# Patient Record
Sex: Female | Born: 1935 | Race: Black or African American | Hispanic: No | Smoking: Never smoker
Health system: Southern US, Community
[De-identification: ages and names within clinical notes are randomized; demographics above are authoritative.]

## PROBLEM LIST (undated history)

## (undated) DIAGNOSIS — J449 Chronic obstructive pulmonary disease, unspecified: Secondary | ICD-10-CM

## (undated) DIAGNOSIS — I491 Atrial premature depolarization: Secondary | ICD-10-CM

## (undated) DIAGNOSIS — I341 Nonrheumatic mitral (valve) prolapse: Secondary | ICD-10-CM

## (undated) DIAGNOSIS — I495 Sick sinus syndrome: Secondary | ICD-10-CM

## (undated) DIAGNOSIS — N3090 Cystitis, unspecified without hematuria: Secondary | ICD-10-CM

## (undated) DIAGNOSIS — I10 Essential (primary) hypertension: Secondary | ICD-10-CM

## (undated) DIAGNOSIS — E785 Hyperlipidemia, unspecified: Secondary | ICD-10-CM

## (undated) DIAGNOSIS — N189 Chronic kidney disease, unspecified: Secondary | ICD-10-CM

## (undated) DIAGNOSIS — M5090 Cervical disc disorder, unspecified, unspecified cervical region: Secondary | ICD-10-CM

## (undated) DIAGNOSIS — E119 Type 2 diabetes mellitus without complications: Secondary | ICD-10-CM

## (undated) DIAGNOSIS — K219 Gastro-esophageal reflux disease without esophagitis: Secondary | ICD-10-CM

## (undated) DIAGNOSIS — K5730 Diverticulosis of large intestine without perforation or abscess without bleeding: Secondary | ICD-10-CM

## (undated) DIAGNOSIS — M1990 Unspecified osteoarthritis, unspecified site: Secondary | ICD-10-CM

## (undated) DIAGNOSIS — E11319 Type 2 diabetes mellitus with unspecified diabetic retinopathy without macular edema: Secondary | ICD-10-CM

## (undated) DIAGNOSIS — K259 Gastric ulcer, unspecified as acute or chronic, without hemorrhage or perforation: Secondary | ICD-10-CM

## (undated) HISTORY — PX: COLONOSCOPY: SHX174

## (undated) HISTORY — PX: ESOPHAGOGASTRODUODENOSCOPY: SHX1529

## (undated) HISTORY — PX: KIDNEY SURGERY: SHX687

## (undated) HISTORY — PX: OTHER SURGICAL HISTORY: SHX169

## (undated) HISTORY — PX: THYROIDECTOMY, PARTIAL: SHX18

## (undated) HISTORY — PX: ABDOMINAL HYSTERECTOMY: SHX81

## (undated) HISTORY — PX: PARATHYROIDECTOMY: SHX19

## (undated) HISTORY — PX: BACK SURGERY: SHX140

---

## 2005-06-09 ENCOUNTER — Ambulatory Visit: Payer: Self-pay | Admitting: Internal Medicine

## 2005-06-16 ENCOUNTER — Ambulatory Visit: Payer: Self-pay | Admitting: Internal Medicine

## 2006-01-22 ENCOUNTER — Encounter: Payer: Self-pay | Admitting: Internal Medicine

## 2006-01-25 ENCOUNTER — Encounter: Payer: Self-pay | Admitting: Internal Medicine

## 2006-02-12 ENCOUNTER — Ambulatory Visit: Payer: Self-pay | Admitting: Unknown Physician Specialty

## 2006-02-24 ENCOUNTER — Encounter: Payer: Self-pay | Admitting: Internal Medicine

## 2006-05-18 ENCOUNTER — Ambulatory Visit: Payer: Self-pay

## 2006-05-20 ENCOUNTER — Ambulatory Visit: Payer: Self-pay | Admitting: Internal Medicine

## 2006-06-18 ENCOUNTER — Ambulatory Visit: Payer: Self-pay | Admitting: Internal Medicine

## 2007-03-06 ENCOUNTER — Emergency Department: Payer: Self-pay | Admitting: Unknown Physician Specialty

## 2007-03-06 ENCOUNTER — Other Ambulatory Visit: Payer: Self-pay

## 2007-03-12 ENCOUNTER — Ambulatory Visit: Payer: Self-pay | Admitting: Gastroenterology

## 2007-03-16 ENCOUNTER — Ambulatory Visit: Payer: Self-pay | Admitting: Gastroenterology

## 2007-03-18 ENCOUNTER — Ambulatory Visit: Payer: Self-pay | Admitting: Gastroenterology

## 2007-06-07 ENCOUNTER — Inpatient Hospital Stay (HOSPITAL_COMMUNITY): Admission: RE | Admit: 2007-06-07 | Discharge: 2007-06-11 | Payer: Self-pay | Admitting: Neurosurgery

## 2007-06-11 ENCOUNTER — Encounter: Payer: Self-pay | Admitting: Internal Medicine

## 2007-06-28 ENCOUNTER — Encounter: Payer: Self-pay | Admitting: Internal Medicine

## 2007-07-28 ENCOUNTER — Encounter: Payer: Self-pay | Admitting: Internal Medicine

## 2007-08-03 ENCOUNTER — Ambulatory Visit: Payer: Self-pay

## 2007-08-28 ENCOUNTER — Encounter: Payer: Self-pay | Admitting: Internal Medicine

## 2007-09-27 ENCOUNTER — Encounter: Payer: Self-pay | Admitting: Internal Medicine

## 2008-02-02 ENCOUNTER — Ambulatory Visit: Payer: Self-pay | Admitting: Podiatry

## 2008-02-18 ENCOUNTER — Ambulatory Visit: Payer: Self-pay | Admitting: Internal Medicine

## 2008-03-03 ENCOUNTER — Ambulatory Visit: Payer: Self-pay | Admitting: Internal Medicine

## 2009-02-07 ENCOUNTER — Encounter: Admission: RE | Admit: 2009-02-07 | Discharge: 2009-02-07 | Payer: Self-pay | Admitting: Podiatry

## 2009-04-09 ENCOUNTER — Ambulatory Visit: Payer: Self-pay | Admitting: Internal Medicine

## 2009-07-09 ENCOUNTER — Ambulatory Visit: Payer: Self-pay | Admitting: Internal Medicine

## 2009-07-14 ENCOUNTER — Ambulatory Visit: Payer: Self-pay | Admitting: Internal Medicine

## 2009-07-18 ENCOUNTER — Encounter: Payer: Self-pay | Admitting: Internal Medicine

## 2009-07-27 ENCOUNTER — Encounter: Payer: Self-pay | Admitting: Internal Medicine

## 2010-04-15 ENCOUNTER — Ambulatory Visit: Payer: Self-pay | Admitting: Internal Medicine

## 2010-07-04 ENCOUNTER — Ambulatory Visit: Payer: Self-pay | Admitting: Internal Medicine

## 2010-07-18 ENCOUNTER — Ambulatory Visit: Payer: Self-pay | Admitting: Internal Medicine

## 2010-12-17 ENCOUNTER — Ambulatory Visit: Payer: Self-pay | Admitting: Urology

## 2011-01-06 ENCOUNTER — Ambulatory Visit: Payer: Self-pay | Admitting: Internal Medicine

## 2011-03-11 NOTE — Op Note (Signed)
[name], [name] NO.:  [id number]   MEDICAL RECORD NO.:  [id number]          PATIENT TYPE:  INP   LOCATION:  3005                         FACILITY:  [facility]   PHYSICIAN:  Hewitt Shorts, M.D.DATE OF BIRTH:  1931-01-22   DATE OF PROCEDURE:  06/07/2007  DATE OF DISCHARGE:                               OPERATIVE REPORT   PREOPERATIVE DIAGNOSES:  Lumbar stenosis, lumbar degenerative  spondylolisthesis, lumbar spondylosis and lumbar radiculopathy.   POSTOPERATIVE DIAGNOSES:  Lumbar stenosis, lumbar degenerative  spondylolisthesis, lumbar spondylosis and lumbar radiculopathy.   PROCEDURES:  1. L4-5 lumbar decompression (beyond that necessary for interbody      fusion, including laminectomy, facetectomy and foraminotomy with      microdissection).  2. Bilateral L4-5 posterior lumbar interbody fusion with AVS PEEK      interbody implants and Vitoss with bone marrow aspirate.  3. Bilateral L4-5 posterolateral arthrodesis with Radius posterior      instrumentation, Vitoss with bone marrow aspirate, Infuse and      locally harvested morcellized autograft.   SURGEON:  Hewitt Shorts, M.D.   ASSISTANTS:  Hilda Lias, M.D.  Russell L. Webb Silversmith, RN.   ANESTHESIA:  General endotracheal.   INDICATIONS:  The patient is a 77 year old woman, who had a degenerative  spondylolisthesis at L4 and 5 with resulting significant multifactorial  lumbar stenosis, with persistent lumbar radicular pain.  The decision  was made to proceeded with decompression and stabilization.   DESCRIPTION OF PROCEDURES:  The patient was brought to the operating  room, placed under general endotracheal anesthesia.  The patient was  turned to a prone position and the lumbar region was prepped with  Betadine Soap and Solution and draped in a sterile fashion.  The midline  was infiltrated with local anesthetic with epinephrine.  An x-ray was  taken and the L4-5 level identified.  A midline  incision was made over  the L4-5 level and carried down through the subcutaneous tissue.  Bipolar cautery and electrocautery were used to maintain hemostasis.  Dissection was carried down to the lumbar fascia, which was incised  bilaterally, and the paraspinal muscles were dissected off the spinous  processes and laminae in a subperiosteal fashion.  A self-retaining  retractor was placed and another x-ray taken and the L4-5 interlaminar  space identified.  Dissection was then carried out laterally over the  hypertrophic facet joints, and the transverse processes of L4 and L5  were identified and exposed.  With magnification, we then proceeded with  a decompression.  Using the [facility] drill and Kerrison punches with double-  action rongeurs to perform a laminectomy, portions of the bone were  saved and cleaned of soft tissue and morcellized to later be used as  autograft.  The spinous processes and laminae were removed, and we then  proceeded with facetectomy, exposing the neural foramina and removing  the thickened ligamentum flavum and exposing the thecal sac and exiting  nerve roots.  We were able to expose and decompress the L4 and L5 nerve  roots bilaterally.  We then exposed the annulus of the L4-5  disk  bilaterally.  Overlying veins were coagulated and divided, and then we  incised the annulus and entered into the disk space using a variety of  microcurets and pituitary rongeurs to perform a thorough diskectomy.  Osteophyte overgrowth from the posterior aspect of the L4 and L5  vertebrae were removed using Kerrison punches and the osteophyte removal  tool, and in the end, a good decompression was achieved with removal of  the degenerated disk material.  We then used a variety of paddle curets  to prepare the vertebral body endplates, removing the cartilaginous  surface down to a good bony surface.  We measured the height of the  intervertebral disk space and selected 10 mm in height  implants.  We  then went ahead and draped the C-arm fluoroscope.  It was brought into  the field, and we probed the L5 pedicles bilaterally and aspirated bone  marrow aspirate from the L5 vertebral body, which was injected over a 10-  mL strip of Vitoss.  We then went ahead and packed the interbody  implants with the Vitoss with bone marrow aspirate, and then carefully  retracted the thecal sac and nerve root.  We positioned the implants in  the intervertebral disk space and they were countersunk.  We placed the  implant first on the right side, and then packed some Vitoss with bone  marrow aspirate in the midline of the disk space, and then placed a  second implant on the left side.  Once both implants were in place, we  proceeded with the posterolateral arthrodesis.  The L5 pedicles were  examined with a ball probe.  No cutouts were found.  They were each then  tapped with  5.25-mm tap, again, examined with a ball probe and no  cutouts were found, and then we placed 5.75 x 40-mm screws.  Then, the  pedicle entry sites for L4 were identified bilaterally.  Each of the  pedicles was probed, reexamined with a ball probe and no cutouts were  found.  Each of them was tapped with a 5.25-mm tap, and again examined  with the ball probe and no cutouts were found, and then we went ahead  and placed 5.75 x 50-mm screws bilaterally.  An A/P view was obtained  and confirmed good trajectories through the pedicle.  We then went ahead  and decorticated the transverse processes of L4 and L5 bilaterally, and  packed Infuse, Vitoss with bone marrow aspirate, and locally harvested  morcellized autograft in the lateral gutters over the transverse  processes of the intertransverse space.  We then selected 30-mm  prelordosed rods that were placed in the screwheads and secured with  locking caps.  Each of the locking caps was then tightened against the  countertorque.  Once all 4 locking caps were secured, we  then again  reexamined the decompression, and the thecal sac and nerve roots  remained well decompressed, and then we proceeded with closure.  The  wound had been irrigated numerous times throughout the procedure with  both saline and Bacitracin solution.  The paraspinal muscles were  approximated with interrupted undyed 1 Vicryl sutures, the deep fascia  closed with interrupted undyed 1 Vicryl sutures, the Scarpa fascia  closed with interrupted undyed 1 Vicryl sutures, and the subcutaneous  and subcuticular layers were closed with interrupted, inverted 2-0 and 3-  0 undyed Vicryl sutures, and the skin edges were approximated with  surgical staples and the wound was dressed with Adaptic and  sterile  gauze.  The procedure was tolerated well.  The estimated blood loss was  450 cc.  We were able to give the patient back 200 mL of Cell Saver  blood.  Sponge and needle counts were correct.      Hewitt Shorts, M.D.  Electronically Signed     RWN/MEDQ  D:  06/07/2007  T:  06/07/2007  Job:  [id number]

## 2011-03-11 NOTE — Discharge Summary (Signed)
[name], [name] NO.:  [id number]   MEDICAL RECORD NO.:  [id number]          PATIENT TYPE:  INP   LOCATION:  3005                         FACILITY:  [facility]   PHYSICIAN:  Hewitt Shorts, M.D.DATE OF BIRTH:  Nov 09, 1930   DATE OF ADMISSION:  06/07/2007  DATE OF DISCHARGE:                               DISCHARGE SUMMARY   HISTORY OF PRESENT ILLNESS:  The patient is a 77 year old woman who has  had difficulty with low back and left thumb radicular pain.  She has a  dynamic, degenerative, grade 1 spondylolisthesis at L4 and L5 with  marked multifactorial stenosis at L4-5.  She was admitted for  decompression and arthrodesis after having undergone extensive  nonsurgical management for over a year.   PAST MEDICAL HISTORY:  Hypertension.  Diabetes.  Hypercholesterolemia.  No history of myocardial infarction, cancer, stroke, peptic ulcer  disease or lung disease.   PAST SURGICAL HISTORY:  Hysterectomy.  Removal of benign right kidney tumor.  Ulcer surgery.   CURRENT MEDICATIONS:  1. Nexium 40 mg b.i.d.  2. Actos 15 mg daily.  3. Glyburide/Metformin 5/500 daily.  4. Metoprolol 50 mg daily.  5. Caltrate with vitamin-D, 1 tablet b.i.d.   PHYSICAL EXAMINATION:  GENERAL:  A well developed, well nourished woman  in no acute distress.  General examination is unremarkable.  NEUROLOGIC:  Strength 5/5 with good sensation.   HOSPITAL COURSE:  The patient was admitted and underwent L4-5 lumbar  decompression, posterior lumbar interbody fusion with interbody implants  and posterolateral arthrodesis with posterior instrumentation and bone  graft.  She has done well following surgery.  Her wound has healed  nicely.  Her staples have to be removed on Monday, August 18th.  She was  seen in consultation postoperatively by Physical Therapy and  Occupational Therapy; they have been working with her on mobility,  transfers, ambulation, ADL's, etc.  Social Work consultation  was  requested to arrange for a brief course of rehabilitation in a skilled  nursing facility.  The patient requested [facility] in [facility]  and the case management service made the arrangements for this.  She is  scheduled to be discharged on June 11, 2007 to [facility].   DISCHARGE DIAGNOSES:  1. Lumbar dynamic degenerative grade 1 spondylolisthesis at L4-5.  2. Multifactorial spinal stenosis at L4-5.  3. Lumbar spondylosis.  4. Lumbar degenerative disc disease.  5. Lumbar radiculopathy.   DISCHARGE MEDICATIONS:  As prior to admission, as well as pain  medication--either Vicodin or Percocet as needed for discomfort or pain.   FOLLOWUP:  She is to return to my office in three to four weeks for  followup with AP and lateral lumbar spine x-rays.  The staples can be  removed at the nursing facility unless there are any concerns about the  wound itself.      Hewitt Shorts, M.D.  Electronically Signed     RWN/MEDQ  D:  06/10/2007  T:  06/10/2007  Job:  [id number]

## 2011-03-11 NOTE — H&P (Signed)
NAMEBRENDALIZ, [name] NO.:  [id number]   MEDICAL RECORD NO.:  [id number]          PATIENT TYPE:  INP   LOCATION:  3005                         FACILITY:  [facility]   PHYSICIAN:  Hewitt Shorts, M.D.DATE OF BIRTH:  [birth year]   DATE OF ADMISSION:  06/07/2007  DATE OF DISCHARGE:                              HISTORY & PHYSICAL   ADMISSION HISTORY AND PHYSICAL EXAMINATION:  The patient is a 77-year-  old right-handed black female whom I have seen off and on for several  years regarding both degenerative changes of the cervical and lumbar  spine.  We have been treating her for the past year for persistent low  back and left lumbar radicular pain.  X-rays have shown a dynamic  degenerative grade 1 spondylolisthesis L4-5.  MRI scan shows the grade 1  spondylolisthesis at L4-5 with marked multifactorial spinal stenosis at  L4-5 and the patient is admitted now for decompression and arthrodesis.   The patient has been treated with extensive non-surgical management  including NSAIDs, physical therapy, steroid dose packs and a number of  epidural steroid injections.  She would get some transient benefit but  no lasting relief.  The patient continues to have significant disabling  low back and left lumbar radicular pain extending from the low back into  the left buttock and posterior thigh.  She gets relief with sitting or  lying but is in pain with being up and about.  We discussed options for  surgical intervention and she is admitted now for such.   PAST MEDICAL HISTORY:  Notable for history of hypertension and diabetes  for over 10 years as well as a history of hypercholesterolemia.  No  history of myocardial infarction, cancer, stroke, peptic ulcer disease  or lung disease.   PAST SURGICAL HISTORY:  Hysterectomy 30 years ago, removal of a benign  right kidney tumor 15 years ago and ulcer surgery 25 years ago.   ALLERGIES:  NO KNOWN DRUG ALLERGIES.   MEDICATIONS:  At  the time of admission included:  1. Nexium 40 mg b.i.d.  2. Actos 15 mg daily.  3. Glyburide/metformin 5/500 mg daily.  4. Metoprolol 50 mg daily.  5. Caltrate with vitamin D.   FAMILY HISTORY:  Her parents have passed on.  Her mother had a stroke  and died at age 49.  Her father had a brain tumor and died at age 25.   SOCIAL HISTORY:  The patient is widowed.  She is retired.  She does not  smoke, drink alcoholic beverages or have a history of substance abuse.   REVIEW OF SYSTEMS:  Notable for what is described in history of present  illness and past medical history but is otherwise unremarkable.   PHYSICAL EXAMINATION:  GENERAL:  The patient is a well-developed, well-  nourished black female in no acute distress.  VITAL SIGNS:  Her height is 5'3.  Weight 75 kg.  Temperature 98.4,  pulse 50, blood pressure 187/96, respiratory rate 16.  LUNGS:  Clear to auscultation.  She has symmetrical respiratory  excursion.  HEART:  Regular rate and  rhythm.  Normal S1, S2.  There is no murmur.  ABDOMEN:  Soft, nondistended.  Bowel sounds are present.  EXTREMITIES:  Examination shows no clubbing, cyanosis or edema.  MUSCULOSKELETAL:  Mild tenderness to palpation diffusely in the lumbar  region.  She is able to flex to 90 degrees and extend to 10 degrees.  There is mild discomfort on extension.  Straight leg raising is negative  bilaterally.  NEUROLOGIC:  Examination shows 5/5 strength to the lower extremities  including the ileal psoas, quadriceps, dorsiflexion, extensor hallicis  longus and plantar flexor bilaterally.  Sensation is intact to pinprick  to the upper and lower extremities.  Reflexes are absent to the middle  in the biceps, brachialis, triceps, quadriceps and gastrocnemius.  They  are symmetric bilaterally.  Toes are downgoing bilaterally.  She has a  normal gait and stance.   IMPRESSION:  The patient with grade 1 dynamic degenerative  spondylolisthesis L4-5 with associated  marked severe multifactorial  lumbar stenosis at L4-5 with low back and left lumbar radicular pain.   PLAN:  The patient will be admitted for bilateral L4-5 lumbar  decompression with laminectomy, facetectomy, micro diskectomy, posterior  lumbar antibody fusion with antibody implants and bone graft to L4-5  posterolateral arthrodesis with posterior sensation and bone graft.  I  have discussed the typical surgery, hospital stay and overall  cooperation.  Limitations postoperatively, need for postoperative  immobilization in a lumbar corset.  Risks of surgery including risks of  infection, bleeding, possibility of transfusion, the risk of liver  dysfunction with pain, weakness, numbness or paresthesias, the risk of  dural tear and CSF leakage, possible need for further surgery, the risk  of failure of the arthrodesis and the risk for myocardial infarction,  stroke, pneumonia and death.  Understanding all of this, she would like  to go ahead with surgery and is admitted for such.      Hewitt Shorts, M.D.  Electronically Signed     RWN/MEDQ  D:  06/09/2007  T:  06/09/2007  Job:  [id number]

## 2011-04-17 ENCOUNTER — Ambulatory Visit: Payer: Self-pay | Admitting: Internal Medicine

## 2011-08-11 LAB — URINE CULTURE: Colony Count: 9000

## 2011-08-11 LAB — CBC
HCT: 26.8 — ABNORMAL LOW
HCT: 40.1
Hemoglobin: 13.6
Hemoglobin: 9 — ABNORMAL LOW
MCHC: 33.7
MCHC: 33.9
MCV: 82.5
MCV: 82.7
Platelets: 131 — ABNORMAL LOW
Platelets: 184
RBC: 3.24 — ABNORMAL LOW
RBC: 4.86
RDW: 13.6
RDW: 13.9
WBC: 3.7 — ABNORMAL LOW
WBC: 5.3

## 2011-08-11 LAB — BASIC METABOLIC PANEL
BUN: 11
CO2: 29
Calcium: 9.6
Chloride: 102
Creatinine, Ser: 1.11
GFR calc Af Amer: 58 — ABNORMAL LOW
GFR calc non Af Amer: 48 — ABNORMAL LOW
Glucose, Bld: 77
Potassium: 4.6
Sodium: 139

## 2011-08-11 LAB — TYPE AND SCREEN
ABO/RH(D): A POS
Antibody Screen: NEGATIVE

## 2011-08-11 LAB — URINE MICROSCOPIC-ADD ON

## 2011-08-11 LAB — URINALYSIS, ROUTINE W REFLEX MICROSCOPIC
Bilirubin Urine: NEGATIVE
Glucose, UA: NEGATIVE
Hgb urine dipstick: NEGATIVE
Ketones, ur: NEGATIVE
Nitrite: NEGATIVE
Protein, ur: NEGATIVE
Specific Gravity, Urine: 1.022
Urobilinogen, UA: 0.2
pH: 5.5

## 2011-08-11 LAB — DIFFERENTIAL
Basophils Absolute: 0
Basophils Relative: 0
Eosinophils Absolute: 0.1
Eosinophils Relative: 2
Lymphocytes Relative: 14
Lymphs Abs: 0.8
Monocytes Absolute: 0.5
Monocytes Relative: 10
Neutro Abs: 3.9
Neutrophils Relative %: 73

## 2011-08-11 LAB — ABO/RH: ABO/RH(D): A POS

## 2011-12-11 ENCOUNTER — Ambulatory Visit: Payer: Self-pay | Admitting: Internal Medicine

## 2011-12-23 ENCOUNTER — Ambulatory Visit: Payer: Self-pay | Admitting: Internal Medicine

## 2012-01-02 ENCOUNTER — Ambulatory Visit: Payer: Self-pay | Admitting: Unknown Physician Specialty

## 2012-06-01 ENCOUNTER — Ambulatory Visit: Payer: Self-pay | Admitting: Unknown Physician Specialty

## 2012-08-10 ENCOUNTER — Ambulatory Visit: Payer: Self-pay | Admitting: Internal Medicine

## 2012-09-21 ENCOUNTER — Ambulatory Visit: Payer: Self-pay | Admitting: Cardiology

## 2012-09-21 LAB — BASIC METABOLIC PANEL
Creatinine: 1.23 mg/dL (ref 0.60–1.30)
Glucose: 92 mg/dL (ref 65–99)
Osmolality: 280 (ref 275–301)
Sodium: 139 mmol/L (ref 136–145)

## 2012-09-21 LAB — CBC WITH DIFFERENTIAL/PLATELET
Basophil %: 0.9 %
Eosinophil #: 0.1 10*3/uL (ref 0.0–0.7)
HCT: 42.4 % (ref 35.0–47.0)
HGB: 14 g/dL (ref 12.0–16.0)
Lymphocyte #: 1.2 10*3/uL (ref 1.0–3.6)
MCH: 27.3 pg (ref 26.0–34.0)
MCHC: 32.9 g/dL (ref 32.0–36.0)
Monocyte #: 0.4 x10 3/mm (ref 0.2–0.9)
Neutrophil %: 59.9 %
WBC: 4.5 10*3/uL (ref 3.6–11.0)

## 2012-09-21 LAB — PROTIME-INR
INR: 0.9
Prothrombin Time: 12.6 secs (ref 11.5–14.7)

## 2012-09-28 ENCOUNTER — Ambulatory Visit: Payer: Self-pay | Admitting: Cardiology

## 2013-03-25 ENCOUNTER — Ambulatory Visit: Payer: Self-pay | Admitting: Emergency Medicine

## 2013-08-18 ENCOUNTER — Ambulatory Visit: Payer: Self-pay | Admitting: Internal Medicine

## 2014-06-14 DIAGNOSIS — I10 Essential (primary) hypertension: Secondary | ICD-10-CM | POA: Insufficient documentation

## 2014-06-14 DIAGNOSIS — E1159 Type 2 diabetes mellitus with other circulatory complications: Secondary | ICD-10-CM | POA: Insufficient documentation

## 2014-06-14 DIAGNOSIS — N183 Chronic kidney disease, stage 3 unspecified: Secondary | ICD-10-CM | POA: Insufficient documentation

## 2014-06-14 DIAGNOSIS — M48061 Spinal stenosis, lumbar region without neurogenic claudication: Secondary | ICD-10-CM | POA: Insufficient documentation

## 2014-06-14 DIAGNOSIS — I495 Sick sinus syndrome: Secondary | ICD-10-CM | POA: Insufficient documentation

## 2014-07-05 DIAGNOSIS — N3941 Urge incontinence: Secondary | ICD-10-CM | POA: Insufficient documentation

## 2014-07-05 DIAGNOSIS — M5430 Sciatica, unspecified side: Secondary | ICD-10-CM | POA: Insufficient documentation

## 2014-07-05 DIAGNOSIS — R350 Frequency of micturition: Secondary | ICD-10-CM | POA: Insufficient documentation

## 2014-07-05 DIAGNOSIS — N3020 Other chronic cystitis without hematuria: Secondary | ICD-10-CM | POA: Insufficient documentation

## 2014-07-05 DIAGNOSIS — R339 Retention of urine, unspecified: Secondary | ICD-10-CM | POA: Insufficient documentation

## 2014-08-09 ENCOUNTER — Ambulatory Visit: Payer: Self-pay | Admitting: Physician Assistant

## 2014-08-14 DIAGNOSIS — J449 Chronic obstructive pulmonary disease, unspecified: Secondary | ICD-10-CM | POA: Insufficient documentation

## 2014-08-15 DIAGNOSIS — E785 Hyperlipidemia, unspecified: Secondary | ICD-10-CM

## 2014-08-15 DIAGNOSIS — E782 Mixed hyperlipidemia: Secondary | ICD-10-CM | POA: Insufficient documentation

## 2014-08-15 DIAGNOSIS — E1169 Type 2 diabetes mellitus with other specified complication: Secondary | ICD-10-CM | POA: Insufficient documentation

## 2014-08-22 ENCOUNTER — Ambulatory Visit: Payer: Self-pay | Admitting: Internal Medicine

## 2014-10-17 DIAGNOSIS — R0681 Apnea, not elsewhere classified: Secondary | ICD-10-CM | POA: Insufficient documentation

## 2014-12-11 ENCOUNTER — Ambulatory Visit: Payer: Self-pay | Admitting: Internal Medicine

## 2014-12-13 DIAGNOSIS — M5136 Other intervertebral disc degeneration, lumbar region: Secondary | ICD-10-CM | POA: Insufficient documentation

## 2014-12-13 DIAGNOSIS — M5116 Intervertebral disc disorders with radiculopathy, lumbar region: Secondary | ICD-10-CM | POA: Insufficient documentation

## 2015-02-13 NOTE — Op Note (Signed)
PATIENT NAME:  Jennifer ScoreCURRIE, Jessica E MR#:  [id number] DATE OF BIRTH:  [birth year]  DATE OF PROCEDURE:  09/28/2012  PREPROCEDURE DIAGNOSIS: Symptomatic sinus bradycardia.   PROCEDURE: Dual-chamber pacemaker implantation.   POSTPROCEDURE DIAGNOSIS: Atrial sensing with ventricular pacing.   INDICATION: The patient is a 81 year old female who has experienced symptomatic sinus bradycardia consistent with sick sinus syndrome. The patient gets dyspneic with minimal levels of exertion with heart rates in the 50s. The patient also experiences generalized fatigue.   DESCRIPTION OF PROCEDURE: The risks, benefits, and alternatives of pacemaker implantation were explained to the patient and informed written consent was obtained.   She was brought to the Operating Room in a fasting state. The left pectoral region was prepped and draped in the usual sterile manner. Anesthesia was obtained with 1% Xylocaine locally. A 6 cm incision was performed over the left pectoral region. The pacemaker pocket was generated by electrocautery and blunt dissection. Access was obtained to the left subclavian vein by fine needle aspiration. Medtronic ventricular and atrial leads were positioned into the right ventricular apex and right atrial appendage under fluoroscopic guidance. After proper thresholds were obtained, the leads were sutured in place. The pacemaker pocket was irrigated with gentamicin solution. The leads were connected to a dual chamber rate-responsive pacemaker generator and positioned in the pocket. The pocket was closed with 2-0 and 4-0 Vicryl, respectively. Steri-Strips and pressure were dressing were applied.  ____________________________ Marcina MillardAlexander Morayma Godown, MD ap:slb D: 09/28/2012 14:01:39 ET     T: 09/28/2012 14:37:33 ET        OB#: [id number] cc: Marcina MillardAlexander Matthias Bogus, MD, <Dictator> Marcina MillardALEXANDER Holly Pring MD ELECTRONICALLY SIGNED 10/08/2012 14:28

## 2015-03-30 ENCOUNTER — Ambulatory Visit: Payer: Self-pay | Admitting: Urology

## 2015-04-05 DIAGNOSIS — I119 Hypertensive heart disease without heart failure: Secondary | ICD-10-CM | POA: Insufficient documentation

## 2015-04-05 DIAGNOSIS — R600 Localized edema: Secondary | ICD-10-CM | POA: Insufficient documentation

## 2015-04-06 DIAGNOSIS — N183 Chronic kidney disease, stage 3 (moderate): Secondary | ICD-10-CM

## 2015-04-06 DIAGNOSIS — E119 Type 2 diabetes mellitus without complications: Secondary | ICD-10-CM | POA: Insufficient documentation

## 2015-04-06 DIAGNOSIS — E1122 Type 2 diabetes mellitus with diabetic chronic kidney disease: Secondary | ICD-10-CM | POA: Insufficient documentation

## 2015-04-17 ENCOUNTER — Other Ambulatory Visit: Payer: Self-pay | Admitting: Neurosurgery

## 2015-04-17 DIAGNOSIS — M4726 Other spondylosis with radiculopathy, lumbar region: Secondary | ICD-10-CM

## 2015-04-24 ENCOUNTER — Ambulatory Visit
Admission: RE | Admit: 2015-04-24 | Discharge: 2015-04-24 | Disposition: A | Discharge status: Home / Self Care | Payer: Medicare Other | Source: Ambulatory Visit | Attending: Neurosurgery | Admitting: Neurosurgery

## 2015-04-24 ENCOUNTER — Encounter: Payer: Self-pay | Admitting: Urology

## 2015-04-24 ENCOUNTER — Ambulatory Visit: Payer: Self-pay | Admitting: Urology

## 2015-04-24 DIAGNOSIS — M4726 Other spondylosis with radiculopathy, lumbar region: Secondary | ICD-10-CM

## 2015-04-24 MED ORDER — ONDANSETRON HCL 4 MG/2ML IJ SOLN
4.0000 mg | Freq: Once | INTRAMUSCULAR | Status: AC
  Administered 2015-04-24: 4 mg via INTRAMUSCULAR

## 2015-04-24 MED ORDER — IOHEXOL 180 MG/ML  SOLN
15.0000 mL | Freq: Once | INTRAMUSCULAR | Status: AC | PRN
  Administered 2015-04-24: 15 mL via EPIDURAL

## 2015-04-24 MED ORDER — MEPERIDINE HCL 100 MG/ML IJ SOLN
50.0000 mg | Freq: Once | INTRAMUSCULAR | Status: AC
  Administered 2015-04-24: 50 mg via INTRAMUSCULAR

## 2015-04-24 NOTE — Discharge Instructions (Signed)
Myelogram Discharge Instructions  1. Go home and rest quietly for the next 24 hours.  It is important to lie flat for the next 24 hours.  Get up only to go to the restroom.  You may lie in the bed or on a couch on your back, your stomach, your left side or your right side.  You may have one pillow under your head.  You may have pillows between your knees while you are on your side or under your knees while you are on your back.  2. DO NOT drive today.  Recline the seat as far back as it will go, while still wearing your seat belt, on the way home.  3. You may get up to go to the bathroom as needed.  You may sit up for 10 minutes to eat.  You may resume your normal diet and medications unless otherwise indicated.  Drink lots of extra fluids today and tomorrow.  4. The incidence of headache, nausea, or vomiting is about 5% (one in 20 patients).  If you develop a headache, lie flat and drink plenty of fluids until the headache goes away.  Caffeinated beverages may be helpful.  If you develop severe nausea and vomiting or a headache that does not go away with flat bed rest, call [phone].  5. You may resume normal activities after your 24 hours of bed rest is over; however, do not exert yourself strongly or do any heavy lifting tomorrow. If when you get up you have a headache when standing, go back to bed and force fluids for another 24 hours.  6. Call your physician for a follow-up appointment.  The results of your myelogram will be sent directly to your physician by the following day.  7. If you have any questions or if complications develop after you arrive home, please call [phone].  Discharge instructions have been explained to the patient.  The patient, or the person responsible for the patient, fully understands these instructions.       May resume Tramadol on April 25, 2015, after 9:30 am.

## 2015-04-24 NOTE — Progress Notes (Signed)
Patient states she has been off Tramadol for at least the past two days.  Jeanne Lohr, RN 

## 2015-07-01 ENCOUNTER — Ambulatory Visit
Admission: EM | Admit: 2015-07-01 | Discharge: 2015-07-01 | Disposition: A | Discharge status: Home / Self Care | Payer: Medicare Other | Attending: Internal Medicine | Admitting: Internal Medicine

## 2015-07-01 DIAGNOSIS — Z7982 Long term (current) use of aspirin: Secondary | ICD-10-CM | POA: Insufficient documentation

## 2015-07-01 DIAGNOSIS — D649 Anemia, unspecified: Secondary | ICD-10-CM | POA: Diagnosis not present

## 2015-07-01 DIAGNOSIS — K921 Melena: Secondary | ICD-10-CM | POA: Diagnosis not present

## 2015-07-01 DIAGNOSIS — R1110 Vomiting, unspecified: Secondary | ICD-10-CM | POA: Diagnosis present

## 2015-07-01 DIAGNOSIS — Z79899 Other long term (current) drug therapy: Secondary | ICD-10-CM | POA: Diagnosis not present

## 2015-07-01 DIAGNOSIS — R710 Precipitous drop in hematocrit: Secondary | ICD-10-CM | POA: Diagnosis not present

## 2015-07-01 LAB — COMPREHENSIVE METABOLIC PANEL
ALK PHOS: 85 U/L (ref 38–126)
ALT: 30 U/L (ref 14–54)
AST: 21 U/L (ref 15–41)
Albumin: 4 g/dL (ref 3.5–5.0)
Anion gap: 10 (ref 5–15)
BILIRUBIN TOTAL: 0.5 mg/dL (ref 0.3–1.2)
BUN: 63 mg/dL — ABNORMAL HIGH (ref 6–20)
CALCIUM: 9.9 mg/dL (ref 8.9–10.3)
CO2: 22 mmol/L (ref 22–32)
Chloride: 103 mmol/L (ref 101–111)
Creatinine, Ser: 2.01 mg/dL — ABNORMAL HIGH (ref 0.44–1.00)
GFR calc non Af Amer: 22 mL/min — ABNORMAL LOW (ref >60)
GFR, EST AFRICAN AMERICAN: 26 mL/min — AB (ref >60)
Glucose, Bld: 176 mg/dL — ABNORMAL HIGH (ref 65–99)
Potassium: 4.7 mmol/L (ref 3.5–5.1)
Sodium: 135 mmol/L (ref 135–145)
TOTAL PROTEIN: 7.4 g/dL (ref 6.5–8.1)

## 2015-07-01 LAB — CBC WITH DIFFERENTIAL/PLATELET
Basophils Absolute: 0.1 10*3/uL (ref 0–0.1)
Basophils Relative: 1 %
EOS ABS: 0.1 10*3/uL (ref 0–0.7)
Eosinophils Relative: 1 %
HCT: 35 % (ref 35.0–47.0)
Hemoglobin: 11.3 g/dL — ABNORMAL LOW (ref 12.0–16.0)
Lymphocytes Relative: 11 %
Lymphs Abs: 1.1 10*3/uL (ref 1.0–3.6)
MCH: 26 pg (ref 26.0–34.0)
MCHC: 32.4 g/dL (ref 32.0–36.0)
MCV: 80.4 fL (ref 80.0–100.0)
MONOS PCT: 7 %
Monocytes Absolute: 0.7 10*3/uL (ref 0.2–0.9)
NEUTROS PCT: 80 %
Neutro Abs: 8.4 10*3/uL — ABNORMAL HIGH (ref 1.4–6.5)
Platelets: 202 10*3/uL (ref 150–440)
RBC: 4.36 MIL/uL (ref 3.80–5.20)
RDW: 15.8 % — AB (ref 11.5–14.5)
WBC: 10.3 10*3/uL (ref 3.6–11.0)

## 2015-07-01 MED ORDER — FAMOTIDINE 40 MG PO TABS
40.0000 mg | ORAL_TABLET | Freq: Once | ORAL | Status: AC
  Administered 2015-07-01: 40 mg via ORAL

## 2015-07-01 NOTE — Discharge Instructions (Signed)
Labs at urgent care today show a slight decrease in hemoglobin (red blood count), to 11.3 from 12.4 in 03/2015. Pick up prescription for nexium and start taking it as planned.   Dose of pepcid  given by mouth at the urgent care today. Please make appointment to see Dr Hyacinth Meeker in the next couple days. Go to Emergency Room for dizziness, weakness, repeated vomiting. Stop hydrochlorothiazide for the next couple days; continue other blood pressure medicines (losartan/HCTZ, amlodipine besylate, isosorbide mononitrate).

## 2015-07-01 NOTE — ED Provider Notes (Signed)
CSN: [id number]     Arrival date & time 07/01/15  1343 History   First MD Initiated Contact with Patient 07/01/15 1540     Chief Complaint  Patient presents with  . Stool Color Change  . Emesis   HPI  Patient is an 81 year old [name] with reported past medical history of upper GI ulcers. She presents today with a 3 week history of dark/black stools, intermittent nausea, and emesis 2. No hematemesis; no coffee-ground emesis. One episode of dizziness, occurred while she was walking to her car from church today and lasted a few seconds. She denies dizziness upon standing. Her med list contains a couple of prescriptions for NSAIDs, but she denies taking them. She is also on several antihypertensives, but blood pressure is in the 100-120s range systolic today. She denies abdominal pain, and indigestion. She had some injections for back pain last week. No fever. Energy level is okay. She has a prescription for Nexium at the pharmacy, that she needs to go pick up.  Past Surgical History  Procedure Laterality Date  . Abdominal hysterectomy     No family history on file. Social History  Substance Use Topics  . Smoking status: Never Smoker   . Smokeless tobacco: None  . Alcohol Use: No    Review of Systems  All other systems reviewed and are negative.   Allergies  Darvon; Hydrocodone; Janumet; Motrin; and Tramadol  Home Medications   Prior to Admission medications   Medication Sig Start Date End Date Taking? Authorizing Provider  amLODipine (NORVASC) 5 MG tablet  01/30/15  Yes Historical Provider, MD  aspirin EC 81 MG tablet Take by mouth.   Yes Historical Provider, MD  budesonide-formoterol (SYMBICORT) 160-4.5 MCG/ACT inhaler Inhale 2 puffs into the lungs every morning.   Yes Historical Provider, MD  glipiZIDE (GLUCOTROL XL) 5 MG 24 hr tablet  06/19/14  Yes Historical Provider, MD  hydrochlorothiazide (HYDRODIURIL) 25 MG tablet Take by mouth. 03/29/15  Yes Historical Provider, MD  isosorbide  mononitrate (IMDUR) 60 MG 24 hr tablet  12/09/14  Yes Historical Provider, MD  losartan-hydrochlorothiazide (HYZAAR) 100-12.5 MG per tablet  01/30/15  Yes Historical Provider, MD  pioglitazone (ACTOS) 15 MG tablet  04/11/15  Yes Historical Provider, MD  acetaminophen (TYLENOL) 325 MG tablet Take by mouth.    Historical Provider, MD  glipiZIDE (GLUCOTROL XL) 5 MG 24 hr tablet Take by mouth. 02/13/15 05/14/15  Historical Provider, MD  linagliptin (TRADJENTA) 5 MG TABS tablet Take 5 mg once daily 10/18/14   Historical Provider, MD  meloxicam (MOBIC) 7.5 MG tablet  12/09/14   Historical Provider, MD  naproxen sodium (RA NAPROXEN SODIUM) 220 MG tablet Take by mouth.    Historical Provider, MD  olmesartan-hydrochlorothiazide (BENICAR HCT) 40-25 MG per tablet  06/08/14   Historical Provider, MD  solifenacin (VESICARE) 5 MG tablet Take 5 mg by mouth. 01/17/15   Historical Provider, MD  traMADol (ULTRAM) 50 MG tablet Take by mouth. 03/29/15   Historical Provider, MD   Meds Ordered and Administered this Visit   Medications  famotidine (PEPCID) tablet 40 mg (40 mg Oral Given 07/01/15 1600)    BP 110/62 mmHg  Pulse 90  Temp(Src) 97.8 F (36.6 C) (Tympanic)  Resp 16  Ht 5\' 2"  (1.575 m)  Wt 178 lb (80.74 kg)  BMI 32.55 kg/m2  SpO2 100% Orthostatic VS for the past 24 hrs:  BP- Lying Pulse- Lying BP- Sitting Pulse- Sitting BP- Standing at 0 minutes Pulse- Standing at 0  minutes  07/01/15 1517 120/55 mmHg 61 101/58 mmHg 64 104/55 mmHg 62    Physical Exam  Constitutional: She is oriented to person, place, and time. No distress.  Alert, nicely groomed  HENT:  Head: Atraumatic.  Eyes:  Conjugate gaze, no eye redness/drainage  Neck: Neck supple.  Cardiovascular: Normal rate and regular rhythm.   Pulmonary/Chest: No respiratory distress. She has no wheezes. She has no rales.  Lungs clear, symmetric breath sounds  Abdominal: Soft. She exhibits no distension. There is no tenderness. There is no rebound and no  guarding.  Musculoskeletal: She exhibits no edema.  No leg swelling  Neurological: She is alert and oriented to person, place, and time.  Skin: Skin is warm and dry.  No cyanosis  Nursing note and vitals reviewed.   ED Course  Procedures  Results for orders placed or performed during the hospital encounter of 07/01/15  Comprehensive metabolic panel  Result Value Ref Range   Sodium 135 135 - 145 mmol/L   Potassium 4.7 3.5 - 5.1 mmol/L   Chloride 103 101 - 111 mmol/L   CO2 22 22 - 32 mmol/L   Glucose, Bld 176 (H) Jennifer - 99 mg/dL   BUN 63 (H) 6 - 20 mg/dL   Creatinine, Ser 1.61 (H) 0.44 - 1.00 mg/dL   Calcium 9.9 8.9 - 09.6 mg/dL   Total Protein 7.4 6.5 - 8.1 g/dL   Albumin 4.0 3.5 - 5.0 g/dL   AST 21 15 - 41 U/L   ALT 30 14 - 54 U/L   Alkaline Phosphatase 85 38 - 126 U/L   Total Bilirubin 0.5 0.3 - 1.2 mg/dL   GFR calc non Af Amer 22 (L) >60 mL/min   GFR calc Af Amer 26 (L) >60 mL/min   Anion gap 10 5 - 15  CBC with Differential  Result Value Ref Range   WBC 10.3 3.6 - 11.0 K/uL   RBC 4.36 3.80 - 5.20 MIL/uL   Hemoglobin 11.3 (L) 12.0 - 16.0 g/dL   HCT 04.5 40.9 - 81.1 %   MCV 80.4 80.0 - 100.0 fL   MCH 26.0 26.0 - 34.0 pg   MCHC 32.4 32.0 - 36.0 g/dL   RDW 91.4 (H) 78.2 - 95.6 %   Platelets 202 150 - 440 K/uL   Neutrophils Relative % 80 %   Neutro Abs 8.4 (H) 1.4 - 6.5 K/uL   Lymphocytes Relative 11 %   Lymphs Abs 1.1 1.0 - 3.6 K/uL   Monocytes Relative 7 %   Monocytes Absolute 0.7 0.2 - 0.9 K/uL   Eosinophils Relative 1 %   Eosinophils Absolute 0.1 0 - 0.7 K/uL   Basophils Relative 1 %   Basophils Absolute 0.1 0 - 0.1 K/uL     Orthostatic VS for the past 24 hrs:  BP- Lying Pulse- Lying BP- Sitting Pulse- Sitting BP- Standing at 0 minutes Pulse- Standing at 0 minutes  07/01/15 1517 120/55 mmHg 61 101/58 mmHg 64 104/55 mmHg 62        MDM   1. Decreased hemoglobin   2. Melena    Hemoglobin is 11.3 today, was 12.4 in June 2016. Does not appear to have  acute blood loss, but does have history suggestive of slow GI bleeding. A dose of Pepcid 40 mg by mouth was given at the urgent care today. Patient agrees to pick up her prescription for Nexium and start taking it. She will follow-up with her PCP/Dr. Bethann Punches, next week. I  encouraged her to not take her hydrochlorothiazide for the next couple of days, because of the lowish blood pressures recorded at the urgent care, and potential risk of fall.    Eustace Moore, MD 07/01/15 [phone]

## 2015-07-01 NOTE — ED Notes (Signed)
Patient complains of dark stools that started 3 weeks ago. Patient states that she has also been nausea and that has been cured by tums, most of the time. She states that twice in the last 2 weeks she has thrown up. She states that stools have been loose as well.

## 2015-07-26 ENCOUNTER — Other Ambulatory Visit: Payer: Self-pay | Admitting: Internal Medicine

## 2015-07-26 DIAGNOSIS — Z1231 Encounter for screening mammogram for malignant neoplasm of breast: Secondary | ICD-10-CM

## 2015-08-28 ENCOUNTER — Ambulatory Visit
Admission: RE | Admit: 2015-08-28 | Discharge: 2015-08-28 | Disposition: A | Discharge status: Home / Self Care | Payer: Medicare Other | Source: Ambulatory Visit | Attending: Internal Medicine | Admitting: Internal Medicine

## 2015-08-28 DIAGNOSIS — Z1231 Encounter for screening mammogram for malignant neoplasm of breast: Secondary | ICD-10-CM | POA: Diagnosis present

## 2015-09-12 ENCOUNTER — Encounter: Payer: Self-pay | Admitting: *Deleted

## 2015-10-09 DIAGNOSIS — I872 Venous insufficiency (chronic) (peripheral): Secondary | ICD-10-CM | POA: Insufficient documentation

## 2015-10-09 DIAGNOSIS — I110 Hypertensive heart disease with heart failure: Secondary | ICD-10-CM | POA: Insufficient documentation

## 2015-10-17 ENCOUNTER — Encounter: Admission: RE | Payer: Self-pay | Source: Ambulatory Visit

## 2015-10-17 ENCOUNTER — Ambulatory Visit
Admission: RE | Admit: 2015-10-17 | Payer: Medicare Other | Source: Ambulatory Visit | Admitting: Unknown Physician Specialty

## 2015-10-17 HISTORY — DX: Nonrheumatic mitral (valve) prolapse: I34.1

## 2015-10-17 HISTORY — DX: Hyperlipidemia, unspecified: E78.5

## 2015-10-17 HISTORY — DX: Unspecified osteoarthritis, unspecified site: M19.90

## 2015-10-17 HISTORY — DX: Atrial premature depolarization: I49.1

## 2015-10-17 HISTORY — DX: Cystitis, unspecified without hematuria: N30.90

## 2015-10-17 HISTORY — DX: Type 2 diabetes mellitus with unspecified diabetic retinopathy without macular edema: E11.319

## 2015-10-17 HISTORY — DX: Diverticulosis of large intestine without perforation or abscess without bleeding: K57.30

## 2015-10-17 HISTORY — DX: Cervical disc disorder, unspecified, unspecified cervical region: M50.90

## 2015-10-17 HISTORY — DX: Chronic obstructive pulmonary disease, unspecified: J44.9

## 2015-10-17 HISTORY — DX: Gastro-esophageal reflux disease without esophagitis: K21.9

## 2015-10-17 HISTORY — DX: Gastric ulcer, unspecified as acute or chronic, without hemorrhage or perforation: K25.9

## 2015-10-17 HISTORY — DX: Essential (primary) hypertension: I10

## 2015-10-17 HISTORY — DX: Sick sinus syndrome: I49.5

## 2015-10-17 HISTORY — DX: Type 2 diabetes mellitus without complications: E11.9

## 2015-10-17 HISTORY — DX: Chronic kidney disease, unspecified: N18.9

## 2015-10-17 SURGERY — ESOPHAGOGASTRODUODENOSCOPY (EGD) WITH PROPOFOL
Anesthesia: General

## 2015-11-27 DIAGNOSIS — I6523 Occlusion and stenosis of bilateral carotid arteries: Secondary | ICD-10-CM | POA: Insufficient documentation

## 2015-12-25 DIAGNOSIS — I340 Nonrheumatic mitral (valve) insufficiency: Secondary | ICD-10-CM | POA: Insufficient documentation

## 2016-03-05 ENCOUNTER — Emergency Department: Payer: Medicare Other

## 2016-03-05 ENCOUNTER — Emergency Department
Admission: EM | Admit: 2016-03-05 | Discharge: 2016-03-05 | Disposition: A | Discharge status: Home / Self Care | Payer: Medicare Other | Attending: Emergency Medicine | Admitting: Emergency Medicine

## 2016-03-05 DIAGNOSIS — E11319 Type 2 diabetes mellitus with unspecified diabetic retinopathy without macular edema: Secondary | ICD-10-CM | POA: Diagnosis not present

## 2016-03-05 DIAGNOSIS — R55 Syncope and collapse: Secondary | ICD-10-CM | POA: Insufficient documentation

## 2016-03-05 DIAGNOSIS — I472 Ventricular tachycardia: Secondary | ICD-10-CM | POA: Diagnosis not present

## 2016-03-05 DIAGNOSIS — J449 Chronic obstructive pulmonary disease, unspecified: Secondary | ICD-10-CM | POA: Diagnosis not present

## 2016-03-05 DIAGNOSIS — I4729 Other ventricular tachycardia: Secondary | ICD-10-CM

## 2016-03-05 DIAGNOSIS — Z79899 Other long term (current) drug therapy: Secondary | ICD-10-CM | POA: Insufficient documentation

## 2016-03-05 DIAGNOSIS — E785 Hyperlipidemia, unspecified: Secondary | ICD-10-CM | POA: Insufficient documentation

## 2016-03-05 DIAGNOSIS — I129 Hypertensive chronic kidney disease with stage 1 through stage 4 chronic kidney disease, or unspecified chronic kidney disease: Secondary | ICD-10-CM | POA: Insufficient documentation

## 2016-03-05 DIAGNOSIS — N189 Chronic kidney disease, unspecified: Secondary | ICD-10-CM | POA: Diagnosis not present

## 2016-03-05 LAB — BASIC METABOLIC PANEL
Anion gap: 10 (ref 5–15)
BUN: 27 mg/dL — ABNORMAL HIGH (ref 6–20)
CALCIUM: 9.7 mg/dL (ref 8.9–10.3)
CHLORIDE: 110 mmol/L (ref 101–111)
CO2: 23 mmol/L (ref 22–32)
Creatinine, Ser: 1.72 mg/dL — ABNORMAL HIGH (ref 0.44–1.00)
GFR, EST AFRICAN AMERICAN: 31 mL/min — AB (ref >60)
GFR, EST NON AFRICAN AMERICAN: 26 mL/min — AB (ref >60)
GLUCOSE: 154 mg/dL — AB (ref 65–99)
POTASSIUM: 3.8 mmol/L (ref 3.5–5.1)
Sodium: 143 mmol/L (ref 135–145)

## 2016-03-05 LAB — URINALYSIS COMPLETE WITH MICROSCOPIC (ARMC ONLY)
BILIRUBIN URINE: NEGATIVE
Glucose, UA: NEGATIVE mg/dL
Hgb urine dipstick: NEGATIVE
Ketones, ur: NEGATIVE mg/dL
NITRITE: NEGATIVE
PH: 5 (ref 5.0–8.0)
Protein, ur: NEGATIVE mg/dL
SPECIFIC GRAVITY, URINE: 1.018 (ref 1.005–1.030)

## 2016-03-05 LAB — PHOSPHORUS: PHOSPHORUS: 3.9 mg/dL (ref 2.5–4.6)

## 2016-03-05 LAB — CBC
HCT: 38.7 % (ref 35.0–47.0)
HEMOGLOBIN: 12.6 g/dL (ref 12.0–16.0)
MCH: 27.4 pg (ref 26.0–34.0)
MCHC: 32.5 g/dL (ref 32.0–36.0)
MCV: 84.2 fL (ref 80.0–100.0)
Platelets: 130 10*3/uL — ABNORMAL LOW (ref 150–440)
RBC: 4.59 MIL/uL (ref 3.80–5.20)
RDW: 16.2 % — ABNORMAL HIGH (ref 11.5–14.5)
WBC: 3.6 10*3/uL (ref 3.6–11.0)

## 2016-03-05 LAB — TROPONIN I: Troponin I: <0.03 ng/mL (ref <0.031)

## 2016-03-05 LAB — MAGNESIUM: Magnesium: 2 mg/dL (ref 1.7–2.4)

## 2016-03-05 NOTE — ED Provider Notes (Addendum)
[facility] Emergency Department Provider Note  ____________________________________________  Time seen: 4:30 PM on arrival by EMS  I have reviewed the triage vital signs and the nursing notes.   HISTORY  Chief Complaint Loss of Consciousness    HPI [name] is a 82 y.o. female brought to the ED for syncope today. The patient denies any chest pain shortness of breath headache numbness tingling weakness, feels like she's been eating and drinking normally in her usual state of health, but today she had a sudden syncopal episode. Did not hit her head. She was seated at this time. She saw cardiology and had her pacemaker interrogated yesterday and there are no acute changes, no reprogramming to the pacemaker. Pacemaker is in place for SA node dysfunction. Patient denies any palpitations     Past Medical History  Diagnosis Date  . Cervical disc disease   . Chronic kidney disease     stage 3  . Cystitis   . Diverticulosis of colon     without mention of hemorrhage  . Diabetes mellitus without complication (HCC)     DM type 2  . Diabetic retinopathy associated with type 2 diabetes mellitus, without macular edema   . Degenerative joint disease   . Gastric ulcer   . GERD (gastroesophageal reflux disease)   . Hyperlipemia   . Hypertension   . COPD (chronic obstructive pulmonary disease) (HCC)   . Mitral valve prolapse   . Premature atrial contraction   . Sick sinus syndrome (HCC)   . Sinoatrial node dysfunction [facility])      Patient Active Problem List   Diagnosis Date Noted  . Diabetes (HCC) 04/06/2015  . Edema leg 04/05/2015  . Hypertensive left ventricular hypertrophy 04/05/2015  . DDD (degenerative disc disease), lumbar 12/13/2014  . Neuritis or radiculitis due to rupture of lumbar intervertebral disc 12/13/2014  . Breathlessness on exertion 10/17/2014  . Combined fat and carbohydrate induced hyperlipemia 08/15/2014  . COPD, mild (HCC)  08/14/2014  . Bladder infection, chronic 07/05/2014  . Incomplete bladder emptying 07/05/2014  . Neuralgia neuritis, sciatic nerve 07/05/2014  . Urge incontinence 07/05/2014  . FOM (frequency of micturition) 07/05/2014  . Benign essential HTN 06/14/2014  . Lumbar canal stenosis 06/14/2014  . Arrhythmia, sinus node (HCC) 06/14/2014     Past Surgical History  Procedure Laterality Date  . Abdominal hysterectomy    . Pacemaker      insertion dual chamber pacemaker generator  . Thyroidectomy, partial      lobectomy partial thyroid  . Parathyroidectomy    . Gastric ulcer surgery    . Back surgery      lumbar back surgery  . Colonoscopy    . Esophagogastroduodenoscopy    . Kidney surgery       Current Outpatient Rx  Name  Route  Sig  Dispense  Refill  . acetaminophen (TYLENOL) 500 MG tablet   Oral   Take 500 mg by mouth every 6 (six) hours as needed for mild pain, moderate pain or fever.         Marland Kitchen amLODipine (NORVASC) 5 MG tablet   Oral   Take 5 mg by mouth daily.          Marland Kitchen aspirin EC 81 MG tablet   Oral   Take 162 mg by mouth daily.          Marland Kitchen atorvastatin (LIPITOR) 10 MG tablet   Oral   Take 10 mg by mouth every morning.         Marland Kitchen  budesonide-formoterol (SYMBICORT) 160-4.5 MCG/ACT inhaler   Inhalation   Inhale 2 puffs into the lungs every morning.         . Capsaicin-Menthol 0.025-1.25 % PTCH   Topical   Apply 1 patch topically daily as needed (for pain).          . carvedilol (COREG) 3.125 MG tablet   Oral   Take 6.25 mg by mouth 2 (two) times daily with a meal.         . glipiZIDE (GLUCOTROL XL) 5 MG 24 hr tablet   Oral   Take 10 mg by mouth 2 (two) times daily.          . Iron-Vitamin C (VITRON-C) 65-125 MG TABS   Oral   Take 1 tablet by mouth daily.         . isosorbide mononitrate (IMDUR) 60 MG 24 hr tablet   Oral   Take 60 mg by mouth daily.          Marland Kitchen losartan-hydrochlorothiazide (HYZAAR) 100-12.5 MG per tablet   Oral    Take 1 tablet by mouth daily.          Marland Kitchen omeprazole (PRILOSEC) 20 MG capsule   Oral   Take 20 mg by mouth 2 (two) times daily.         . pioglitazone (ACTOS) 15 MG tablet   Oral   Take 15 mg by mouth daily.          Marland Kitchen EXPIRED: glipiZIDE (GLUCOTROL XL) 5 MG 24 hr tablet   Oral   Take by mouth.            Allergies Darvon; Hydrocodone; Janumet; Motrin; and Tramadol   Family History  Problem Relation Age of Onset  . Breast cancer Mother   . Breast cancer Maternal Aunt     2 mat aunts  . Breast cancer Other     Social History Social History  Substance Use Topics  . Smoking status: Never Smoker   . Smokeless tobacco: None  . Alcohol Use: No    Review of Systems  Constitutional:   No fever or chills.  Eyes:   No vision changes.  ENT:   No sore throat. No rhinorrhea. Cardiovascular:   No chest pain. Respiratory:   No dyspnea or cough. Gastrointestinal:   Negative for abdominal pain, vomiting and diarrhea.  Genitourinary:   Negative for dysuria or difficulty urinating. Musculoskeletal:   Negative for focal pain or swelling Neurological:   Negative for headaches. Positive syncope 10-point ROS otherwise negative.  ____________________________________________   PHYSICAL EXAM:  VITAL SIGNS: ED Triage Vitals  Enc Vitals Group     BP 03/05/16 1633 152/87 mmHg     Pulse Rate 03/05/16 1633 75     Resp 03/05/16 1633 12     Temp 03/05/16 1633 98.4 F (36.9 C)     Temp Source 03/05/16 1633 Oral     SpO2 03/05/16 1630 97 %     Weight --      Height --      Head Cir --      Peak Flow --      Pain Score 03/05/16 1634 0     Pain Loc --      Pain Edu? --      Excl. in GC? --     Vital signs reviewed, nursing assessments reviewed.   Constitutional:   Alert and oriented. Well appearing and in no distress. Eyes:   No scleral  icterus. No conjunctival pallor. PERRL. EOMI.  No nystagmus. ENT   Head:   Normocephalic and atraumatic.   Nose:   No  congestion/rhinnorhea. No septal hematoma   Mouth/Throat:   MMM, no pharyngeal erythema. No peritonsillar mass.    Neck:   No stridor. No SubQ emphysema. No meningismus. Hematological/Lymphatic/Immunilogical:   No cervical lymphadenopathy. Cardiovascular:   RRR. Symmetric bilateral radial and DP pulses.  No murmurs.  Respiratory:   Normal respiratory effort without tachypnea nor retractions. Breath sounds are clear and equal bilaterally. No wheezes/rales/rhonchi. Gastrointestinal:   Soft and nontender. Non distended. There is no CVA tenderness.  No rebound, rigidity, or guarding. Genitourinary:   deferred Musculoskeletal:   Nontender with normal range of motion in all extremities. No joint effusions.  No lower extremity tenderness.  No edema. Neurologic:   Normal speech and language.  CN 2-10 normal. Motor grossly intact. No gross focal neurologic deficits are appreciated.  Skin:    Skin is warm, dry and intact. No rash noted.  No petechiae, purpura, or bullae.  ____________________________________________    LABS (pertinent positives/negatives) (all labs ordered are listed, but only abnormal results are displayed) Labs Reviewed  BASIC METABOLIC PANEL - Abnormal; Notable for the following:    Glucose, Bld 154 (*)    BUN 27 (*)    Creatinine, Ser 1.72 (*)    GFR calc non Af Amer 26 (*)    GFR calc Af Amer 31 (*)    All other components within normal limits  CBC - Abnormal; Notable for the following:    RDW 16.2 (*)    Platelets 130 (*)    All other components within normal limits  TROPONIN I  MAGNESIUM  PHOSPHORUS  URINALYSIS COMPLETEWITH MICROSCOPIC ([facility] ONLY)  CBG MONITORING, ED   ____________________________________________   EKG  EKG performed at 1640 Interpreted by me Nitropaste rhythm rate of 63, left axis, right bundle-branch block. Normal ST segments and T waves. PR interval of 354 ms  Multiple prehospital EMS EKGs reviewed And 1613, patient appeared to  be in baseline rhythm with left axis, right bundle-branch block, heart rate of 63 At 1617, patient is in a wide complex tachycardia which is regular, no identifiable pacer spikes, left axis, left bundle branch block, heart rate about 100 And 1623, patient remains in a wide complex tachycardia with left bundle branch block left axis heart rate 114, At 1619 EKG is unchanged, tachycardic at 117.  ____________________________________________    RADIOLOGY  Chest x-ray unremarkable  ____________________________________________   PROCEDURES   ____________________________________________   INITIAL IMPRESSION / ASSESSMENT AND PLAN / ED COURSE  Pertinent labs & imaging results that were available during my care of the patient were reviewed by me and considered in my medical decision making (see chart for details).  Patient not in distress. Presents with syncope. EMS EKGs reveal an approximate 10 minute episode of what appears to be a regular wide complex tachycardia, suspicious for ventricular tachycardia. Pacemaker interrogated, devices status is discussed with the [employer] rep who notes that it does not record anything less than 180 bpm and so therefore did not record this episode. Labs unremarkable, chest x-ray unremarkable, vitals remained unremarkable, mental status appropriate. Low suspicion for stroke ACS dissection. Case discussed with Dr. Lady Gary, agrees with telemetry observation overnight and evaluation tomorrow. I'll discuss with the hospitalist for further management.  Patient reports compliance with her medications, not in acute dysrhythmia at this time in the emergency department and not requiring immediate pharmacologic intervention  or cardioversion.  ----------------------------------------- 8:04 PM on 03/05/2016 -----------------------------------------  On evaluation from the hospitalist, the patient refuses to be admitted to the hospital.  She was informed that we are  worried about a serious dysrythmia and is at risk of serious illness that could cause permanent disability or death. She wants to go home and will follow up with cardiology. Informed that she is on is welcome to return to the emergency department or hospital she wishes to continue her care has any other concerns. She has Medical decision-making capacity, not suicidal or hallucinating or otherwise having any disorder of thought Process or content. She is alert and oriented and calm.  Will discharge against medical advice.     ____________________________________________   FINAL CLINICAL IMPRESSION(S) / ED DIAGNOSES  Final diagnoses:  Syncope, unspecified syncope type  Ventricular tachycardia (paroxysmal) (HCC)       Portions of this note were generated with dragon dictation software. Dictation errors may occur despite best attempts at proofreading.   Sharman CheekPhillip Siriyah Ambrosius, MD 03/05/16 1907  Sharman CheekPhillip Danille Oppedisano, MD 03/05/16 2009

## 2016-03-05 NOTE — ED Notes (Addendum)
Pt informed of delay. A/Ox4.

## 2016-03-05 NOTE — ED Notes (Signed)
Pt bib EMS w/ c/o syncopal episode while at work. A/Ox4.  Per EMS, pt "was completely out" and had 1 episode of emesis.  Pt sts "I feel normal", denies pain.  Sts that she has been eating/drinking like normal, no recent illness.  Sts that she saw cardiologist yesterday, pacemaker checked and had meds changed.  NAD

## 2016-03-05 NOTE — Discharge Instructions (Signed)

## 2016-05-20 DIAGNOSIS — E538 Deficiency of other specified B group vitamins: Secondary | ICD-10-CM | POA: Insufficient documentation

## 2016-07-03 ENCOUNTER — Other Ambulatory Visit: Payer: Self-pay | Admitting: Internal Medicine

## 2016-07-03 DIAGNOSIS — Z1231 Encounter for screening mammogram for malignant neoplasm of breast: Secondary | ICD-10-CM

## 2016-09-02 ENCOUNTER — Ambulatory Visit: Admission: RE | Admit: 2016-09-02 | Payer: Medicare Other | Source: Ambulatory Visit

## 2016-09-10 ENCOUNTER — Encounter (INDEPENDENT_AMBULATORY_CARE_PROVIDER_SITE_OTHER): Payer: Self-pay

## 2016-09-10 ENCOUNTER — Ambulatory Visit
Admission: RE | Admit: 2016-09-10 | Discharge: 2016-09-10 | Disposition: A | Discharge status: Home / Self Care | Payer: Medicare Other | Source: Ambulatory Visit | Attending: Internal Medicine | Admitting: Internal Medicine

## 2016-09-10 DIAGNOSIS — Z1231 Encounter for screening mammogram for malignant neoplasm of breast: Secondary | ICD-10-CM | POA: Insufficient documentation

## 2016-12-31 DIAGNOSIS — N183 Chronic kidney disease, stage 3 (moderate): Secondary | ICD-10-CM

## 2016-12-31 DIAGNOSIS — N1831 Chronic kidney disease, stage 3a: Secondary | ICD-10-CM | POA: Insufficient documentation

## 2016-12-31 DIAGNOSIS — I071 Rheumatic tricuspid insufficiency: Secondary | ICD-10-CM | POA: Insufficient documentation

## 2017-01-12 ENCOUNTER — Ambulatory Visit
Admission: RE | Admit: 2017-01-12 | Discharge: 2017-01-12 | Disposition: A | Discharge status: Home / Self Care | Payer: Medicare Other | Source: Ambulatory Visit | Attending: Nurse Practitioner | Admitting: Nurse Practitioner

## 2017-01-12 ENCOUNTER — Other Ambulatory Visit: Payer: Self-pay | Admitting: Nurse Practitioner

## 2017-01-12 DIAGNOSIS — M79605 Pain in left leg: Secondary | ICD-10-CM

## 2017-03-10 DIAGNOSIS — E669 Obesity, unspecified: Secondary | ICD-10-CM | POA: Insufficient documentation

## 2017-03-10 DIAGNOSIS — M7062 Trochanteric bursitis, left hip: Secondary | ICD-10-CM | POA: Insufficient documentation

## 2017-04-13 ENCOUNTER — Other Ambulatory Visit: Payer: Self-pay | Admitting: Neurosurgery

## 2017-04-13 DIAGNOSIS — M5136 Other intervertebral disc degeneration, lumbar region: Secondary | ICD-10-CM

## 2017-04-21 ENCOUNTER — Ambulatory Visit
Admission: RE | Admit: 2017-04-21 | Discharge: 2017-04-21 | Disposition: A | Discharge status: Home / Self Care | Payer: Medicare Other | Source: Ambulatory Visit | Attending: Neurosurgery | Admitting: Neurosurgery

## 2017-04-21 DIAGNOSIS — M5136 Other intervertebral disc degeneration, lumbar region: Secondary | ICD-10-CM

## 2017-04-21 MED ORDER — MEPERIDINE HCL 100 MG/ML IJ SOLN
50.0000 mg | Freq: Once | INTRAMUSCULAR | Status: AC
  Administered 2017-04-21: 50 mg via INTRAMUSCULAR

## 2017-04-21 MED ORDER — ONDANSETRON HCL 4 MG/2ML IJ SOLN
4.0000 mg | Freq: Once | INTRAMUSCULAR | Status: AC
  Administered 2017-04-21: 4 mg via INTRAMUSCULAR

## 2017-04-21 MED ORDER — DIAZEPAM 5 MG PO TABS
5.0000 mg | ORAL_TABLET | Freq: Once | ORAL | Status: AC
  Administered 2017-04-21: 5 mg via ORAL

## 2017-04-21 MED ORDER — IOPAMIDOL (ISOVUE-M 200) INJECTION 41%
15.0000 mL | Freq: Once | INTRAMUSCULAR | Status: AC
  Administered 2017-04-21: 15 mL via INTRATHECAL

## 2017-04-21 NOTE — Discharge Instructions (Signed)

## 2017-05-22 ENCOUNTER — Other Ambulatory Visit: Payer: Self-pay | Admitting: Internal Medicine

## 2017-05-22 DIAGNOSIS — Z1231 Encounter for screening mammogram for malignant neoplasm of breast: Secondary | ICD-10-CM

## 2017-06-09 ENCOUNTER — Ambulatory Visit (INDEPENDENT_AMBULATORY_CARE_PROVIDER_SITE_OTHER): Payer: Medicare Other | Admitting: Vascular Surgery

## 2017-06-09 ENCOUNTER — Encounter (INDEPENDENT_AMBULATORY_CARE_PROVIDER_SITE_OTHER): Payer: Self-pay | Admitting: Vascular Surgery

## 2017-06-09 VITALS — BP 119/69 | HR 90 | Resp 16 | Ht 64.0 in | Wt 178.4 lb

## 2017-06-09 DIAGNOSIS — N183 Chronic kidney disease, stage 3 unspecified: Secondary | ICD-10-CM

## 2017-06-09 DIAGNOSIS — R600 Localized edema: Secondary | ICD-10-CM | POA: Diagnosis not present

## 2017-06-09 DIAGNOSIS — I340 Nonrheumatic mitral (valve) insufficiency: Secondary | ICD-10-CM

## 2017-06-09 DIAGNOSIS — E118 Type 2 diabetes mellitus with unspecified complications: Secondary | ICD-10-CM | POA: Diagnosis not present

## 2017-06-09 DIAGNOSIS — I10 Essential (primary) hypertension: Secondary | ICD-10-CM | POA: Diagnosis not present

## 2017-06-09 NOTE — Assessment & Plan Note (Signed)
blood glucose control important in reducing the progression of atherosclerotic disease. Also, involved in wound healing. On appropriate medications.  

## 2017-06-09 NOTE — Assessment & Plan Note (Signed)
blood pressure control important in reducing the progression of atherosclerotic disease. On appropriate oral medications.  

## 2017-06-09 NOTE — Assessment & Plan Note (Signed)
Could certainly worsen LE swelling 

## 2017-06-09 NOTE — Patient Instructions (Signed)

## 2017-06-09 NOTE — Assessment & Plan Note (Signed)

## 2017-06-09 NOTE — Progress Notes (Signed)
Patient ID: [name], female   DOB: [birth year], 83 y.o.   MRN: [id number]  Chief Complaint  Patient presents with  . New Patient (Initial Visit)    Pt stated B/l feet and ankle are swelling.    HPI [name] is a 83 y.o. female.  I am asked to see the patient by Debbra Riding PA/Dr. Paul Half for evaluation of leg swelling.  The patient reports 1-2 months of significant swelling in the legs. There was not a clear inciting event or causative factor that started the swelling. Both lower extremities are affected. She has multiple medical issues including chronic kidney disease which could be contributing to the swelling. The swelling creates heaviness and pain in the legs. She denies ulceration or infection. She has no fever or chills. She denies any antecedent history of DVT or superficial thrombophlebitis. She has not had previous major surgery or trauma on the legs. Nothing so far has really made it much better other than elevation slightly improves.   Past Medical History:  Diagnosis Date  . Cervical disc disease   . Chronic kidney disease    stage 3  . COPD (chronic obstructive pulmonary disease) (HCC)   . Cystitis   . Degenerative joint disease   . Diabetes mellitus without complication (HCC)    DM type 2  . Diabetic retinopathy associated with type 2 diabetes mellitus, without macular edema   . Diverticulosis of colon    without mention of hemorrhage  . Gastric ulcer   . GERD (gastroesophageal reflux disease)   . Hyperlipemia   . Hypertension   . Mitral valve prolapse   . Premature atrial contraction   . Sick sinus syndrome (HCC)   . Sinoatrial node dysfunction (HCC)     Past Surgical History:  Procedure Laterality Date  . ABDOMINAL HYSTERECTOMY    . BACK SURGERY     lumbar back surgery  . COLONOSCOPY    . ESOPHAGOGASTRODUODENOSCOPY    . gastric ulcer surgery    . KIDNEY SURGERY    . pacemaker     insertion dual chamber pacemaker generator    . PARATHYROIDECTOMY    . THYROIDECTOMY, PARTIAL     lobectomy partial thyroid    Family History  Problem Relation Age of Onset  . Breast cancer Mother 52  . Breast cancer Other   . Breast cancer Maternal Aunt        2 mat aunts  No bleeding or clotting disorders  Social History Social History  Substance Use Topics  . Smoking status: Never Smoker  . Smokeless tobacco: Never Used  . Alcohol use No  No IV drug use  Allergies  Allergen Reactions  . Darvon [Propoxyphene] Nausea And Vomiting  . Hydrocodone Nausea And Vomiting  . Janumet [Sitagliptin-Metformin Hcl] Nausea And Vomiting  . Motrin [Ibuprofen] Nausea And Vomiting  . Tramadol Nausea And Vomiting    Can tolerate this medication in small doses (takes half a pill).    Current Outpatient Prescriptions  Medication Sig Dispense Refill  . acetaminophen (TYLENOL) 500 MG tablet Take 500 mg by mouth every 6 (six) hours as needed for mild pain, moderate pain or fever.    Marland Kitchen amLODipine (NORVASC) 5 MG tablet Take 5 mg by mouth daily.     Marland Kitchen aspirin EC 81 MG tablet Take 162 mg by mouth daily.     Marland Kitchen atorvastatin (LIPITOR) 10 MG tablet Take 10 mg by mouth every morning.    Marland Kitchen  budesonide-formoterol (SYMBICORT) 160-4.5 MCG/ACT inhaler Inhale 2 puffs into the lungs every morning.    . carvedilol (COREG) 3.125 MG tablet Take 6.25 mg by mouth 2 (two) times daily with a meal.    . furosemide (LASIX) 20 MG tablet Take 20 mg by mouth daily.    Marland Kitchen glipiZIDE (GLUCOTROL XL) 5 MG 24 hr tablet Take 10 mg by mouth 2 (two) times daily.     . Iron-Vitamin C (VITRON-C) 65-125 MG TABS Take 1 tablet by mouth daily.    . isosorbide mononitrate (IMDUR) 60 MG 24 hr tablet Take 60 mg by mouth daily.     Marland Kitchen losartan-hydrochlorothiazide (HYZAAR) 100-12.5 MG per tablet Take 1 tablet by mouth daily.     . meloxicam (MOBIC) 7.5 MG tablet Take 7.5 mg by mouth daily.    Marland Kitchen omeprazole (PRILOSEC) 20 MG capsule Take 20 mg by mouth 2 (two) times daily.    .  pioglitazone (ACTOS) 15 MG tablet Take 15 mg by mouth daily.     . vitamin B-12 (CYANOCOBALAMIN) 1000 MCG tablet Take 1,000 mcg by mouth daily.     No current facility-administered medications for this visit.       REVIEW OF SYSTEMS (Negative unless checked)  Constitutional: [] Weight loss  [] Fever  [] Chills Cardiac: [] Chest pain   [] Chest pressure   [] Palpitations   [] Shortness of breath when laying flat   [] Shortness of breath at rest   [x] Shortness of breath with exertion. Vascular:  [] Pain in legs with walking   [x] Pain in legs at rest   [] Pain in legs when laying flat   [] Claudication   [] Pain in feet when walking  [] Pain in feet at rest  [] Pain in feet when laying flat   [] History of DVT   [] Phlebitis   [x] Swelling in legs   [] Varicose veins   [] Non-healing ulcers Pulmonary:   [] Uses home oxygen   [] Productive cough   [] Hemoptysis   [] Wheeze  [] COPD   [] Asthma Neurologic:  [] Dizziness  [] Blackouts   [] Seizures   [] History of stroke   [] History of TIA  [] Aphasia   [] Temporary blindness   [] Dysphagia   [] Weakness or numbness in arms   [] Weakness or numbness in legs Musculoskeletal:  [x] Arthritis   [] Joint swelling   [] Joint pain   [] Low back pain Hematologic:  [] Easy bruising  [] Easy bleeding   [] Hypercoagulable state   [] Anemic  [] Hepatitis Gastrointestinal:  [] Blood in stool   [] Vomiting blood  [] Gastroesophageal reflux/heartburn   [] Abdominal pain Genitourinary:  [x] Chronic kidney disease   [] Difficult urination  [] Frequent urination  [] Burning with urination   [] Hematuria Skin:  [] Rashes   [] Ulcers   [] Wounds Psychological:  [] History of anxiety   []  History of major depression.    Physical Exam BP 119/69   Pulse 90   Resp 16   Ht 5\' 4"  (1.626 m)   Wt 178 lb 6.4 oz (80.9 kg)   BMI 30.62 kg/m  Gen:  WD/WN, NAD Head: [place]/AT, No temporalis wasting.  Ear/Nose/Throat: Hearing grossly intact, nares w/o erythema or drainage, oropharynx w/o Erythema/Exudate Eyes: Conjunctiva clear,  sclera non-icteric  Neck: trachea midline.  No JVD.  Pulmonary:  Good air movement, respirations not labored, no use of accessory muscles Cardiac: RRR Vascular:  Vessel Right Left  Radial Palpable Palpable  Ulnar Palpable Palpable  Brachial Palpable Palpable  Carotid Palpable, without bruit Palpable, without bruit  Aorta Not palpable N/A  Femoral Palpable Palpable  Popliteal Palpable Palpable  PT 1+ Palpable Trace Palpable  DP 1+ Palpable 1+ Palpable   Gastrointestinal: soft, non-tender/non-distended. Musculoskeletal: M/S 5/5 throughout.  Extremities without ischemic changes.  No deformity or atrophy. 2+ BLE edema. Neurologic: Sensation grossly intact in extremities.  Symmetrical.  Speech is fluent. Motor exam as listed above. Psychiatric: Judgment intact, Mood & affect appropriate for pt's clinical situation. Dermatologic: No rashes or ulcers noted.  No cellulitis or open wounds.  Radiology No results found.  Labs No results found for this or any previous visit (from the past 2160 hour(s)).  Assessment/Plan:  Benign essential HTN blood pressure control important in reducing the progression of atherosclerotic disease. On appropriate oral medications.   Moderate mitral insufficiency Could worsen swelling  Chronic kidney disease, stage III (moderate) Could certainly worsen LE swelling.  Edema leg I have had a long discussion with the patient regarding swelling and why it  causes symptoms.  Patient will begin wearing graduated compression stockings class 1 (20-30 mmHg) on a daily basis a prescription was given. The patient will  beginning wearing the stockings first thing in the morning and removing them in the evening. The patient is instructed specifically not to sleep in the stockings.   In addition, behavioral modification will be initiated.  This will include frequent elevation, use of over the counter pain medications and exercise such as walking.  I have reviewed  systemic causes for chronic edema such as liver, kidney and cardiac etiologies.  The patient denies problems with these organ systems.    Consideration for a lymph pump will also be made based upon the effectiveness of conservative therapy.  This would help to improve the edema control and prevent sequela such as ulcers and infections   Patient should undergo duplex ultrasound of the venous system to ensure that DVT or reflux is not present.  The patient will follow-up with me after the ultrasound.    Diabetes blood glucose control important in reducing the progression of atherosclerotic disease. Also, involved in wound healing. On appropriate medications.       Festus BarrenJason Tresa Jolley 06/09/2017, 5:40 PM   This note was created with Dragon medical transcription system.  Any errors from dictation are unintentional.

## 2017-06-09 NOTE — Assessment & Plan Note (Signed)
Could worsen swelling

## 2017-07-07 ENCOUNTER — Ambulatory Visit (INDEPENDENT_AMBULATORY_CARE_PROVIDER_SITE_OTHER): Payer: Self-pay | Admitting: Vascular Surgery

## 2017-08-04 ENCOUNTER — Ambulatory Visit (INDEPENDENT_AMBULATORY_CARE_PROVIDER_SITE_OTHER): Payer: Self-pay | Admitting: Vascular Surgery

## 2017-08-19 ENCOUNTER — Ambulatory Visit (INDEPENDENT_AMBULATORY_CARE_PROVIDER_SITE_OTHER): Payer: Medicare Other

## 2017-08-19 ENCOUNTER — Ambulatory Visit (INDEPENDENT_AMBULATORY_CARE_PROVIDER_SITE_OTHER): Payer: Medicare Other | Admitting: Vascular Surgery

## 2017-08-19 ENCOUNTER — Encounter (INDEPENDENT_AMBULATORY_CARE_PROVIDER_SITE_OTHER): Payer: Self-pay | Admitting: Vascular Surgery

## 2017-08-19 VITALS — BP 154/89 | HR 89 | Resp 16 | Wt 178.0 lb

## 2017-08-19 DIAGNOSIS — R600 Localized edema: Secondary | ICD-10-CM

## 2017-08-19 DIAGNOSIS — I872 Venous insufficiency (chronic) (peripheral): Secondary | ICD-10-CM

## 2017-08-19 DIAGNOSIS — E118 Type 2 diabetes mellitus with unspecified complications: Secondary | ICD-10-CM

## 2017-08-19 DIAGNOSIS — E782 Mixed hyperlipidemia: Secondary | ICD-10-CM | POA: Diagnosis not present

## 2017-08-19 NOTE — Progress Notes (Signed)
Subjective:    Patient ID: [name], female    DOB: 08/07/1931, 83 y.o.   MRN: [id number] Chief Complaint  Patient presents with  . Follow-up    53mo bil ven reflux   Patient presents to review vascular studies. Patient was last seen on 06/09/2017 for evaluation of lower extremity edema. Since that visit, the patient has been engaging in conservative therapy including wearing medical grade 1 compression stockings, elevating her legs and remaining as active as possible. The patient was also started on Lasix. Patient reports them proved to her lower extremity edema with conservative therapy and Lasix. Patient underwent a bilateral lower extremity venous reflux exam which was notable for venous incompetence in the right common femoral, saphenous femoral junction and great saphenous vein of right lower extremity. Venous incompetence noted in the left common femoral vein, saphenous femoral junction and greater saphenous veinleft lower extremity. No evidence of deep or superficial vein thrombosis in the bilateral lower extremities. Patient denies any fever, nausea or vomiting.   Review of Systems  Constitutional: Negative.   HENT: Negative.   Eyes: Negative.   Respiratory: Negative.   Cardiovascular: Negative.   Gastrointestinal: Negative.   Endocrine: Negative.   Genitourinary: Negative.   Musculoskeletal: Negative.   Skin: Negative.   Allergic/Immunologic: Negative.   Neurological: Negative.   Hematological: Negative.   Psychiatric/Behavioral: Negative.       Objective:   Physical Exam  Constitutional: She is oriented to person, place, and time. She appears well-developed and well-nourished. No distress.  HENT:  Head: Normocephalic and atraumatic.  Eyes: Pupils are equal, round, and reactive to light. Conjunctivae are normal.  Neck: Normal range of motion.  Cardiovascular: Normal rate, regular rhythm, normal heart sounds and intact distal pulses.   Pulses:      Radial  pulses are 2+ on the right side, and 2+ on the left side.  Pulmonary/Chest: Effort normal.  Musculoskeletal: Normal range of motion. She exhibits edema (mild bilateral lower extremity edema noted mostly to the ankle.).  Neurological: She is alert and oriented to person, place, and time.  Skin: Skin is warm and dry. She is not diaphoretic.  Psychiatric: She has a normal mood and affect. Her behavior is normal. Judgment and thought content normal.  Vitals reviewed.  BP (!) 154/89 (BP Location: Right Arm)   Pulse 89   Resp 16   Wt 178 lb (80.7 kg)   BMI 30.55 kg/m   Past Medical History:  Diagnosis Date  . Cervical disc disease   . Chronic kidney disease    stage 3  . COPD (chronic obstructive pulmonary disease) (HCC)   . Cystitis   . Degenerative joint disease   . Diabetes mellitus without complication (HCC)    DM type 2  . Diabetic retinopathy associated with type 2 diabetes mellitus, without macular edema   . Diverticulosis of colon    without mention of hemorrhage  . Gastric ulcer   . GERD (gastroesophageal reflux disease)   . Hyperlipemia   . Hypertension   . Mitral valve prolapse   . Premature atrial contraction   . Sick sinus syndrome (HCC)   . Sinoatrial node dysfunction (HCC)    Social History   Social History  . Marital status: Widowed    Spouse name: N/A  . Number of children: N/A  . Years of education: N/A   Occupational History  . Not on file.   Social History Main Topics  . Smoking status: Never Smoker  .  Smokeless tobacco: Never Used  . Alcohol use No  . Drug use: No  . Sexual activity: Not on file   Other Topics Concern  . Not on file   Social History Narrative  . No narrative on file   Past Surgical History:  Procedure Laterality Date  . ABDOMINAL HYSTERECTOMY    . BACK SURGERY     lumbar back surgery  . COLONOSCOPY    . ESOPHAGOGASTRODUODENOSCOPY    . gastric ulcer surgery    . KIDNEY SURGERY    . pacemaker     insertion dual  chamber pacemaker generator  . PARATHYROIDECTOMY    . THYROIDECTOMY, PARTIAL     lobectomy partial thyroid   Family History  Problem Relation Age of Onset  . Breast cancer Mother 47  . Breast cancer Other   . Breast cancer Maternal Aunt        2 mat aunts   Allergies  Allergen Reactions  . Darvon [Propoxyphene] Nausea And Vomiting  . Hydrocodone Nausea And Vomiting  . Janumet [Sitagliptin-Metformin Hcl] Nausea And Vomiting  . Motrin [Ibuprofen] Nausea And Vomiting  . Tramadol Nausea And Vomiting    Can tolerate this medication in small doses (takes half a pill).      Assessment & Plan:  Patient presents to review vascular studies. Patient was last seen on 06/09/2017 for evaluation of lower extremity edema. Since that visit, the patient has been engaging in conservative therapy including wearing medical grade 1 compression stockings, elevating her legs and remaining as active as possible. The patient was also started on Lasix. Patient reports them proved to her lower extremity edema with conservative therapy and Lasix. Patient underwent a bilateral lower extremity venous reflux exam which was notable for venous incompetence in the right common femoral, saphenous femoral junction and great saphenous vein of right lower extremity. Venous incompetence noted in the left common femoral vein, saphenous femoral junction and greater saphenous veinleft lower extremity. No evidence of deep or superficial vein thrombosis in the bilateral lower extremities. Patient denies any fever, nausea or vomiting.  1. Chronic venous insufficiency - New Patient with bilateral lower extremity venous reflux noted on exam today. Patient states improvement to her edema with conservative therapy and Lasix. At this time, the patient does not want to move forward with either a lymphedema pump or laser ablation. The patient will follow up in 2 months to assess her progress with conservative therapy.  2. Type 2 diabetes  mellitus with complication, without long-term current use of insulin (HCC) - Stable Encouraged good control as its slows the progression of atherosclerotic disease  3. Combined fat and carbohydrate induced hyperlipemia - Stable Encouraged good control as its slows the progression of atherosclerotic disease  Current Outpatient Prescriptions on File Prior to Visit  Medication Sig Dispense Refill  . acetaminophen (TYLENOL) 500 MG tablet Take 500 mg by mouth every 6 (six) hours as needed for mild pain, moderate pain or fever.    Marland Kitchen amLODipine (NORVASC) 5 MG tablet Take 5 mg by mouth daily.     Marland Kitchen aspirin EC 81 MG tablet Take 162 mg by mouth daily.     Marland Kitchen atorvastatin (LIPITOR) 10 MG tablet Take 10 mg by mouth every morning.    . budesonide-formoterol (SYMBICORT) 160-4.5 MCG/ACT inhaler Inhale 2 puffs into the lungs every morning.    . carvedilol (COREG) 3.125 MG tablet Take 6.25 mg by mouth 2 (two) times daily with a meal.    . furosemide (LASIX)  20 MG tablet Take 20 mg by mouth daily.    Marland Kitchen glipiZIDE (GLUCOTROL XL) 5 MG 24 hr tablet Take 10 mg by mouth 2 (two) times daily.     . Iron-Vitamin C (VITRON-C) 65-125 MG TABS Take 1 tablet by mouth daily.    . isosorbide mononitrate (IMDUR) 60 MG 24 hr tablet Take 60 mg by mouth daily.     Marland Kitchen losartan-hydrochlorothiazide (HYZAAR) 100-12.5 MG per tablet Take 1 tablet by mouth daily.     . meloxicam (MOBIC) 7.5 MG tablet Take 7.5 mg by mouth daily.    Marland Kitchen omeprazole (PRILOSEC) 20 MG capsule Take 20 mg by mouth 2 (two) times daily.    . pioglitazone (ACTOS) 15 MG tablet Take 15 mg by mouth daily.     . vitamin B-12 (CYANOCOBALAMIN) 1000 MCG tablet Take 1,000 mcg by mouth daily.     No current facility-administered medications on file prior to visit.     There are no Patient Instructions on file for this visit. No Follow-up on file.   Brentlee Sciara A Shawon Denzer, PA-C

## 2017-09-14 ENCOUNTER — Ambulatory Visit
Admission: RE | Admit: 2017-09-14 | Discharge: 2017-09-14 | Disposition: A | Discharge status: Home / Self Care | Payer: Medicare Other | Source: Ambulatory Visit | Attending: Internal Medicine | Admitting: Internal Medicine

## 2017-09-14 DIAGNOSIS — Z1231 Encounter for screening mammogram for malignant neoplasm of breast: Secondary | ICD-10-CM | POA: Diagnosis not present

## 2017-09-15 ENCOUNTER — Ambulatory Visit (INDEPENDENT_AMBULATORY_CARE_PROVIDER_SITE_OTHER): Payer: Medicare Other | Admitting: Vascular Surgery

## 2017-09-15 ENCOUNTER — Encounter (INDEPENDENT_AMBULATORY_CARE_PROVIDER_SITE_OTHER): Payer: Self-pay

## 2017-09-15 ENCOUNTER — Encounter (INDEPENDENT_AMBULATORY_CARE_PROVIDER_SITE_OTHER): Payer: Medicare Other

## 2017-09-16 ENCOUNTER — Ambulatory Visit (INDEPENDENT_AMBULATORY_CARE_PROVIDER_SITE_OTHER): Payer: Medicare Other | Admitting: Family Medicine

## 2017-09-16 ENCOUNTER — Encounter: Payer: Self-pay | Admitting: Family Medicine

## 2017-09-16 VITALS — BP 138/80 | HR 67 | Resp 16 | Ht 64.0 in | Wt 180.3 lb

## 2017-09-16 DIAGNOSIS — J449 Chronic obstructive pulmonary disease, unspecified: Secondary | ICD-10-CM

## 2017-09-16 DIAGNOSIS — M48061 Spinal stenosis, lumbar region without neurogenic claudication: Secondary | ICD-10-CM

## 2017-09-16 DIAGNOSIS — E538 Deficiency of other specified B group vitamins: Secondary | ICD-10-CM | POA: Diagnosis not present

## 2017-09-16 DIAGNOSIS — I10 Essential (primary) hypertension: Secondary | ICD-10-CM

## 2017-09-16 DIAGNOSIS — I110 Hypertensive heart disease with heart failure: Secondary | ICD-10-CM | POA: Diagnosis not present

## 2017-09-16 DIAGNOSIS — I495 Sick sinus syndrome: Secondary | ICD-10-CM | POA: Diagnosis not present

## 2017-09-16 DIAGNOSIS — E1122 Type 2 diabetes mellitus with diabetic chronic kidney disease: Secondary | ICD-10-CM

## 2017-09-16 DIAGNOSIS — N183 Chronic kidney disease, stage 3 unspecified: Secondary | ICD-10-CM

## 2017-09-16 DIAGNOSIS — Z23 Encounter for immunization: Secondary | ICD-10-CM

## 2017-09-16 DIAGNOSIS — R2681 Unsteadiness on feet: Secondary | ICD-10-CM | POA: Diagnosis not present

## 2017-09-16 DIAGNOSIS — E669 Obesity, unspecified: Secondary | ICD-10-CM

## 2017-09-16 DIAGNOSIS — E785 Hyperlipidemia, unspecified: Secondary | ICD-10-CM

## 2017-09-16 DIAGNOSIS — Z8711 Personal history of peptic ulcer disease: Secondary | ICD-10-CM

## 2017-09-16 MED ORDER — GABAPENTIN 300 MG PO CAPS: 300.0000 mg | ORAL_CAPSULE | Freq: Two times a day (BID) | ORAL | 2 refills | Status: DC

## 2017-09-16 NOTE — Progress Notes (Signed)
Date:  09/16/2017   Name:  [name]   DOB:  04-10-1931   MRN:  [id number]  PCP:  Rusty Aus, MD    Chief Complaint: Advice Only (PCP Sabra Heck- only wants consult) and Hip Pain (Left Hip pain dx stenosis years ago. Seeing Orthopedic for back and hip but not helping only injections )   History of Present Illness:  This is an 83 y.o. female seen for initial visit. Unsure if wants to transfer primary care. C/o LBP radiating to L hip, getting injections from ortho but not helping, last one month ago. Known spinal stenosis, told not surgical candidate. Take gabapentin for nerve pain but only on 300 mg daily, higher doses have not been tried. Taking Mobic daily and Aleve prn as Tylenol does not help. T2DM on glipizide and Actos, last a1c 8.1% last month. CKD3 on losartan, last eGFR 44 in Aug. On B12 supp, last level high in July. CAD/LVH on Coreg/Imdur/Lasix/statin/asa, has pacemaker, followed by cards. BLE L>R improves overnight. Has had several falls at home. Father died brain tumor 50, mother died breast ca/MI 85, brother with RA. Flu and Pneumovax UTD, tet 3 yrs ago, no zoster. Mammo yesterday, colonoscopy yrs ago normal.  Review of Systems:  Review of Systems  Constitutional: Negative for chills and fever.  HENT: Negative for ear pain, sinus pain and trouble swallowing.   Respiratory: Negative for cough.   Cardiovascular: Negative for chest pain.  Gastrointestinal: Negative for abdominal pain, constipation and diarrhea.  Endocrine: Negative for polydipsia and polyuria.  Genitourinary: Negative for difficulty urinating and dysuria.  Neurological: Negative for syncope and light-headedness.  Hematological: Negative for adenopathy.    Patient Active Problem List   Diagnosis Date Noted  . History of peptic ulcer disease 09/16/2017  . Obesity (BMI 30-39.9) 03/10/2017  . Trochanteric bursitis of left hip 03/10/2017  . CKD (chronic kidney disease) stage 3, GFR 30-59 ml/min (HCC)  12/31/2016  . Moderate tricuspid insufficiency 12/31/2016  . B12 deficiency 05/20/2016  . Moderate mitral insufficiency 12/25/2015  . Bilateral carotid artery stenosis 11/27/2015  . Chronic venous insufficiency 10/09/2015  . LVH (left ventricular hypertrophy) due to hypertensive disease, with heart failure ([place]) 10/09/2015  . Controlled type 2 diabetes mellitus with stage 3 chronic kidney disease ([place]) 04/06/2015  . Edema leg 04/05/2015  . Hypertensive left ventricular hypertrophy 04/05/2015  . DDD (degenerative disc disease), lumbar 12/13/2014  . Neuritis or radiculitis due to rupture of lumbar intervertebral disc 12/13/2014  . Breathlessness on exertion 10/17/2014  . Hyperlipidemia due to type 2 diabetes mellitus ([place]) 08/15/2014  . COPD, mild ([place]) 08/14/2014  . Bladder infection, chronic 07/05/2014  . Incomplete bladder emptying 07/05/2014  . Neuralgia neuritis, sciatic nerve 07/05/2014  . Urge incontinence 07/05/2014  . FOM (frequency of micturition) 07/05/2014  . Benign essential HTN 06/14/2014  . Lumbar canal stenosis 06/14/2014  . SA node dysfunction ([place]) 06/14/2014  . Hypertension associated with diabetes ([place]) 06/14/2014    Prior to Admission medications   Medication Sig Start Date End Date Taking? Authorizing Provider  acetaminophen (TYLENOL) 500 MG tablet Take 1,000 mg by mouth every 8 (eight) hours as needed.   Yes [provider]  aspirin EC 81 MG tablet Take 162 mg by mouth daily.    Yes [provider]  atorvastatin (LIPITOR) 20 MG tablet Take 20 mg by mouth daily.   Yes [provider]  budesonide-formoterol (SYMBICORT) 160-4.5 MCG/ACT inhaler Inhale 1 puff into the lungs 2 (two) times  daily as needed. 07/17/17  Yes [provider]  carboxymethylcellulose (REFRESH TEARS) 0.5 % SOLN Apply to eye.   Yes [provider]  carvedilol (COREG) 3.125 MG tablet Take 6.25 mg by mouth 2 (two) times daily with a meal.   Yes [provider]  furosemide (LASIX) 20 MG tablet Take 20 mg by mouth daily.   Yes [provider]  gabapentin (NEURONTIN) 300 MG capsule Take 1 capsule (300 mg total) by mouth 2 (two) times daily. 09/16/17  Yes Demetrias Goodbar, Gwyndolyn Saxon, MD  glipiZIDE (GLUCOTROL XL) 5 MG 24 hr tablet Take 10 mg by mouth 2 (two) times daily.    Yes [provider]  glucose blood (PRECISION QID TEST) test strip Use 2 (two) times daily. True metrix meter. Diagnosis: E11.29 06/01/17 06/01/18 Yes [provider]  isosorbide mononitrate (IMDUR) 60 MG 24 hr tablet Take 60 mg by mouth daily.    Yes [provider]  losartan (COZAAR) 100 MG tablet Take by mouth. 03/20/17 03/20/18 Yes [provider]  pioglitazone (ACTOS) 30 MG tablet Take 30 mg by mouth daily.   Yes [provider]  vitamin B-12 (CYANOCOBALAMIN) 1000 MCG tablet Take 1,000 mcg by mouth daily.   Yes [provider]    Allergies  Allergen Reactions  . Darvon [Propoxyphene] Nausea And Vomiting  . Hydrocodone Nausea And Vomiting  . Janumet [Sitagliptin-Metformin Hcl] Nausea And Vomiting  . Motrin [Ibuprofen] Nausea And Vomiting  . Tramadol Nausea And Vomiting    Can tolerate this medication in small doses (takes half a pill).    Past Surgical History:  Procedure Laterality Date  . ABDOMINAL HYSTERECTOMY    . BACK SURGERY     lumbar back surgery  . COLONOSCOPY    . ESOPHAGOGASTRODUODENOSCOPY    . gastric ulcer surgery    . KIDNEY SURGERY    . pacemaker     insertion dual chamber pacemaker generator  . PARATHYROIDECTOMY    . THYROIDECTOMY, PARTIAL     lobectomy partial thyroid    Social History   Tobacco Use  . Smoking status: Never Smoker  . Smokeless tobacco: Never Used  Substance Use Topics  . Alcohol use: No    Alcohol/week: 0.0 oz  . Drug use: No    Family History  Problem Relation Age of Onset  . Breast cancer Mother 77  . Breast cancer Other   . Breast cancer Maternal Aunt         2 mat aunts    Medication list has been reviewed and updated.  Physical Examination: BP 138/80   Pulse 67   Resp 16   Ht '5\' 4"'  (1.626 m)   Wt 180 lb 4.8 oz (81.8 kg)   SpO2 99%   BMI 30.95 kg/m   Physical Exam  Constitutional: She is oriented to person, place, and time. She appears well-developed and well-nourished.  HENT:  Head: Normocephalic and atraumatic.  Right Ear: External ear normal.  Left Ear: External ear normal.  Nose: Nose normal.  Mouth/Throat: Oropharynx is clear and moist.  TMs clear  Eyes: Conjunctivae and EOM are normal. Pupils are equal, round, and reactive to light.  Neck: Neck supple. No thyromegaly present.  Cardiovascular: Normal rate, regular rhythm and normal heart sounds.  Pulmonary/Chest: Effort normal and breath sounds normal.  Abdominal: Soft. She exhibits no distension and no mass. There is no tenderness.  Well healed midline scar  Musculoskeletal:  1+ RLE, 2+ LLE edema  Lymphadenopathy:  She has no cervical adenopathy.  Neurological: She is alert and oriented to person, place, and time. Coordination normal.  Romberg positive, gait wide-based  Skin: Skin is warm and dry.  Psychiatric: She has a normal mood and affect. Her behavior is normal.  Nursing note and vitals reviewed.   Assessment and Plan:  1. LVH (left ventricular hypertrophy) due to hypertensive disease, with heart failure (HCC) Stable on current regimen, cards following  2. SA node dysfunction ([place]) S/p pacemaker, cards following  3. Controlled type 2 diabetes mellitus with stage 3 chronic kidney disease, without long-term current use of insulin (HCC) Adequate control on glipizide/Actos, d/c Mobic/naproxen, consider metformin next visit  4. COPD, mild (HCC) Using Symbicort prn  5. Spinal stenosis of lumbar region, with neurogenic claudication present Increase gabapentin to 300 mg bid, Tylenol 1000 mg q8h prn, ortho following  6. Benign essential HTN Well controlled  on losartan/Coreg/Lasix  7. Hyperlipidemia, unspecified hyperlipidemia type Well controlled on Lipitor  8. Gait instability with falls Consider PT  9. B12 deficiency On supplement, consider level next visit  10. Obesity (BMI 30-39.9) Unable to exercise  11. History of peptic ulcer disease  12. Need for pneumococcal vaccination - Pneumococcal conjugate vaccine 13-valent  Return in about 4 weeks (around 10/14/2017).   45 mins spent with pt over half in counseling  Satira Anis. Kami Kube, [facility]  09/16/2017

## 2017-09-16 NOTE — Patient Instructions (Signed)
Stop meloxicam and naproxen (harmful to kidneys). Stop iron/vitamin C supplement (not needed). Increase gabapentin to 300 mg twice daily (should help back pain).

## 2017-09-19 ENCOUNTER — Other Ambulatory Visit: Payer: Self-pay

## 2017-09-19 ENCOUNTER — Emergency Department: Payer: Medicare Other

## 2017-09-19 ENCOUNTER — Inpatient Hospital Stay
Admission: EM | Admit: 2017-09-19 | Discharge: 2017-09-21 | DRG: 193 | Disposition: A | Discharge status: Home Health | Payer: Medicare Other | Attending: Internal Medicine | Admitting: Internal Medicine

## 2017-09-19 DIAGNOSIS — M1990 Unspecified osteoarthritis, unspecified site: Secondary | ICD-10-CM | POA: Diagnosis present

## 2017-09-19 DIAGNOSIS — Z886 Allergy status to analgesic agent status: Secondary | ICD-10-CM

## 2017-09-19 DIAGNOSIS — R509 Fever, unspecified: Secondary | ICD-10-CM | POA: Diagnosis present

## 2017-09-19 DIAGNOSIS — Z7984 Long term (current) use of oral hypoglycemic drugs: Secondary | ICD-10-CM

## 2017-09-19 DIAGNOSIS — E785 Hyperlipidemia, unspecified: Secondary | ICD-10-CM | POA: Diagnosis present

## 2017-09-19 DIAGNOSIS — E669 Obesity, unspecified: Secondary | ICD-10-CM | POA: Diagnosis present

## 2017-09-19 DIAGNOSIS — Z7982 Long term (current) use of aspirin: Secondary | ICD-10-CM

## 2017-09-19 DIAGNOSIS — Z885 Allergy status to narcotic agent status: Secondary | ICD-10-CM

## 2017-09-19 DIAGNOSIS — J440 Chronic obstructive pulmonary disease with acute lower respiratory infection: Secondary | ICD-10-CM | POA: Diagnosis present

## 2017-09-19 DIAGNOSIS — E11319 Type 2 diabetes mellitus with unspecified diabetic retinopathy without macular edema: Secondary | ICD-10-CM | POA: Diagnosis present

## 2017-09-19 DIAGNOSIS — E1122 Type 2 diabetes mellitus with diabetic chronic kidney disease: Secondary | ICD-10-CM | POA: Diagnosis present

## 2017-09-19 DIAGNOSIS — N183 Chronic kidney disease, stage 3 (moderate): Secondary | ICD-10-CM | POA: Diagnosis present

## 2017-09-19 DIAGNOSIS — I129 Hypertensive chronic kidney disease with stage 1 through stage 4 chronic kidney disease, or unspecified chronic kidney disease: Secondary | ICD-10-CM | POA: Diagnosis present

## 2017-09-19 DIAGNOSIS — J9601 Acute respiratory failure with hypoxia: Secondary | ICD-10-CM | POA: Diagnosis present

## 2017-09-19 DIAGNOSIS — J181 Lobar pneumonia, unspecified organism: Secondary | ICD-10-CM | POA: Diagnosis not present

## 2017-09-19 DIAGNOSIS — Z9181 History of falling: Secondary | ICD-10-CM | POA: Diagnosis not present

## 2017-09-19 DIAGNOSIS — Z6830 Body mass index (BMI) 30.0-30.9, adult: Secondary | ICD-10-CM | POA: Diagnosis not present

## 2017-09-19 DIAGNOSIS — N189 Chronic kidney disease, unspecified: Secondary | ICD-10-CM | POA: Diagnosis present

## 2017-09-19 DIAGNOSIS — Z791 Long term (current) use of non-steroidal anti-inflammatories (NSAID): Secondary | ICD-10-CM | POA: Diagnosis not present

## 2017-09-19 DIAGNOSIS — J189 Pneumonia, unspecified organism: Secondary | ICD-10-CM | POA: Diagnosis present

## 2017-09-19 DIAGNOSIS — Z888 Allergy status to other drugs, medicaments and biological substances status: Secondary | ICD-10-CM

## 2017-09-19 DIAGNOSIS — Z79899 Other long term (current) drug therapy: Secondary | ICD-10-CM

## 2017-09-19 DIAGNOSIS — I248 Other forms of acute ischemic heart disease: Secondary | ICD-10-CM | POA: Diagnosis present

## 2017-09-19 DIAGNOSIS — A419 Sepsis, unspecified organism: Secondary | ICD-10-CM

## 2017-09-19 DIAGNOSIS — E1151 Type 2 diabetes mellitus with diabetic peripheral angiopathy without gangrene: Secondary | ICD-10-CM | POA: Diagnosis present

## 2017-09-19 DIAGNOSIS — E876 Hypokalemia: Secondary | ICD-10-CM | POA: Diagnosis present

## 2017-09-19 DIAGNOSIS — R410 Disorientation, unspecified: Secondary | ICD-10-CM

## 2017-09-19 LAB — URINALYSIS, ROUTINE W REFLEX MICROSCOPIC
BILIRUBIN URINE: NEGATIVE
GLUCOSE, UA: NEGATIVE mg/dL
HGB URINE DIPSTICK: NEGATIVE
Ketones, ur: NEGATIVE mg/dL
Leukocytes, UA: NEGATIVE
Nitrite: NEGATIVE
PROTEIN: NEGATIVE mg/dL
Specific Gravity, Urine: 1.01 (ref 1.005–1.030)
pH: 7 (ref 5.0–8.0)

## 2017-09-19 LAB — CBC WITH DIFFERENTIAL/PLATELET
BASOS ABS: 0 10*3/uL (ref 0–0.1)
BASOS PCT: 0 %
EOS ABS: 0.1 10*3/uL (ref 0–0.7)
EOS PCT: 1 %
HCT: 40.1 % (ref 35.0–47.0)
Hemoglobin: 13.1 g/dL (ref 12.0–16.0)
Lymphocytes Relative: 7 %
Lymphs Abs: 0.4 10*3/uL — ABNORMAL LOW (ref 1.0–3.6)
MCH: 28.3 pg (ref 26.0–34.0)
MCHC: 32.6 g/dL (ref 32.0–36.0)
MCV: 86.7 fL (ref 80.0–100.0)
MONO ABS: 0.4 10*3/uL (ref 0.2–0.9)
Monocytes Relative: 6 %
Neutro Abs: 5.8 10*3/uL (ref 1.4–6.5)
Neutrophils Relative %: 86 %
PLATELETS: 137 10*3/uL — AB (ref 150–440)
RBC: 4.63 MIL/uL (ref 3.80–5.20)
RDW: 14.9 % — AB (ref 11.5–14.5)
WBC: 6.7 10*3/uL (ref 3.6–11.0)

## 2017-09-19 LAB — COMPREHENSIVE METABOLIC PANEL
ALBUMIN: 3.7 g/dL (ref 3.5–5.0)
ALK PHOS: 60 U/L (ref 38–126)
ALT: 27 U/L (ref 14–54)
ANION GAP: 10 (ref 5–15)
AST: 25 U/L (ref 15–41)
BILIRUBIN TOTAL: 1 mg/dL (ref 0.3–1.2)
BUN: 23 mg/dL — AB (ref 6–20)
CALCIUM: 9.6 mg/dL (ref 8.9–10.3)
CO2: 28 mmol/L (ref 22–32)
Chloride: 101 mmol/L (ref 101–111)
Creatinine, Ser: 1.61 mg/dL — ABNORMAL HIGH (ref 0.44–1.00)
GFR calc Af Amer: 33 mL/min — ABNORMAL LOW (ref >60)
GFR, EST NON AFRICAN AMERICAN: 28 mL/min — AB (ref >60)
GLUCOSE: 104 mg/dL — AB (ref 65–99)
POTASSIUM: 3.2 mmol/L — AB (ref 3.5–5.1)
Sodium: 139 mmol/L (ref 135–145)
TOTAL PROTEIN: 7.2 g/dL (ref 6.5–8.1)

## 2017-09-19 LAB — LACTIC ACID, PLASMA: Lactic Acid, Venous: 1 mmol/L (ref 0.5–1.9)

## 2017-09-19 LAB — GLUCOSE, CAPILLARY: GLUCOSE-CAPILLARY: 129 mg/dL — AB (ref 65–99)

## 2017-09-19 LAB — INFLUENZA PANEL BY PCR (TYPE A & B)
INFLAPCR: NEGATIVE
Influenza B By PCR: NEGATIVE

## 2017-09-19 LAB — LIPASE, BLOOD: LIPASE: 27 U/L (ref 11–51)

## 2017-09-19 LAB — TROPONIN I: TROPONIN I: 0.04 ng/mL — AB (ref <0.03)

## 2017-09-19 LAB — PROTIME-INR
INR: 0.92
PROTHROMBIN TIME: 12.3 s (ref 11.4–15.2)

## 2017-09-19 LAB — PROCALCITONIN: PROCALCITONIN: 0.41 ng/mL

## 2017-09-19 MED ORDER — VANCOMYCIN HCL IN DEXTROSE 1-5 GM/200ML-% IV SOLN
1000.0000 mg | Freq: Once | INTRAVENOUS | Status: AC
  Administered 2017-09-19: 1000 mg via INTRAVENOUS
  Filled 2017-09-19: qty 200

## 2017-09-19 MED ORDER — POLYVINYL ALCOHOL 1.4 % OP SOLN
1.0000 [drp] | Freq: Two times a day (BID) | OPHTHALMIC | Status: DC | PRN
  Filled 2017-09-19: qty 15

## 2017-09-19 MED ORDER — GLIPIZIDE ER 10 MG PO TB24
10.0000 mg | ORAL_TABLET | Freq: Two times a day (BID) | ORAL | Status: DC
  Administered 2017-09-20 – 2017-09-21 (×3): 10 mg via ORAL
  Filled 2017-09-19 (×5): qty 1

## 2017-09-19 MED ORDER — HEPARIN SODIUM (PORCINE) 5000 UNIT/ML IJ SOLN
5000.0000 [IU] | Freq: Three times a day (TID) | INTRAMUSCULAR | Status: DC
  Administered 2017-09-19 – 2017-09-21 (×5): 5000 [IU] via SUBCUTANEOUS
  Filled 2017-09-19 (×5): qty 1

## 2017-09-19 MED ORDER — DEXTROSE 5 % IV SOLN: 1.0000 g | INTRAVENOUS | Status: DC

## 2017-09-19 MED ORDER — DEXTROSE 5 % IV SOLN
500.0000 mg | INTRAVENOUS | Status: DC
  Filled 2017-09-19: qty 500

## 2017-09-19 MED ORDER — DEXTROSE 5 % IV SOLN
1.0000 g | INTRAVENOUS | Status: DC
  Filled 2017-09-19: qty 10

## 2017-09-19 MED ORDER — FUROSEMIDE 20 MG PO TABS
20.0000 mg | ORAL_TABLET | Freq: Every day | ORAL | Status: DC
Start: 2017-09-20 — End: 2017-09-21
  Administered 2017-09-20 – 2017-09-21 (×2): 20 mg via ORAL
  Filled 2017-09-19 (×2): qty 1

## 2017-09-19 MED ORDER — ATORVASTATIN CALCIUM 20 MG PO TABS
20.0000 mg | ORAL_TABLET | Freq: Every day | ORAL | Status: DC
  Administered 2017-09-20: 20 mg via ORAL
  Filled 2017-09-19: qty 1

## 2017-09-19 MED ORDER — INSULIN ASPART 100 UNIT/ML ~~LOC~~ SOLN
0.0000 [IU] | Freq: Three times a day (TID) | SUBCUTANEOUS | Status: DC
  Administered 2017-09-20 (×3): 3 [IU] via SUBCUTANEOUS
  Filled 2017-09-19 (×3): qty 1

## 2017-09-19 MED ORDER — VITAMIN B-12 1000 MCG PO TABS
1000.0000 ug | ORAL_TABLET | Freq: Every day | ORAL | Status: DC
  Administered 2017-09-21: 09:00:00 1000 ug via ORAL
  Filled 2017-09-19 (×2): qty 1

## 2017-09-19 MED ORDER — ACETAMINOPHEN 10 MG/ML IV SOLN
1000.0000 mg | Freq: Once | INTRAVENOUS | Status: AC
  Administered 2017-09-19: 1000 mg via INTRAVENOUS
  Filled 2017-09-19: qty 100

## 2017-09-19 MED ORDER — POTASSIUM CHLORIDE 20 MEQ PO PACK
40.0000 meq | PACK | Freq: Once | ORAL | Status: AC
  Administered 2017-09-19: 40 meq via ORAL
  Filled 2017-09-19: qty 2

## 2017-09-19 MED ORDER — PIOGLITAZONE HCL 30 MG PO TABS
30.0000 mg | ORAL_TABLET | Freq: Every day | ORAL | Status: DC
  Administered 2017-09-20 – 2017-09-21 (×2): 30 mg via ORAL
  Filled 2017-09-19 (×2): qty 1

## 2017-09-19 MED ORDER — CARBOXYMETHYLCELLULOSE SODIUM 0.5 % OP SOLN: 1.0000 [drp] | Freq: Two times a day (BID) | OPHTHALMIC | Status: DC | PRN

## 2017-09-19 MED ORDER — PIPERACILLIN-TAZOBACTAM 3.375 G IVPB 30 MIN
3.3750 g | Freq: Once | INTRAVENOUS | Status: AC
  Administered 2017-09-19: 3.375 g via INTRAVENOUS
  Filled 2017-09-19: qty 50

## 2017-09-19 MED ORDER — MOMETASONE FURO-FORMOTEROL FUM 200-5 MCG/ACT IN AERO
2.0000 | INHALATION_SPRAY | Freq: Two times a day (BID) | RESPIRATORY_TRACT | Status: DC
  Administered 2017-09-20 – 2017-09-21 (×3): 2 via RESPIRATORY_TRACT
  Filled 2017-09-19: qty 8.8

## 2017-09-19 MED ORDER — DEXTROSE 5 % IV SOLN
1.0000 g | INTRAVENOUS | Status: DC
  Administered 2017-09-20 (×2): 1 g via INTRAVENOUS
  Filled 2017-09-19 (×3): qty 10

## 2017-09-19 MED ORDER — DEXTROSE 5 % IV SOLN
500.0000 mg | INTRAVENOUS | Status: DC
  Administered 2017-09-19 – 2017-09-20 (×2): 500 mg via INTRAVENOUS
  Filled 2017-09-19 (×3): qty 500

## 2017-09-19 MED ORDER — SODIUM CHLORIDE 0.9 % IV BOLUS (SEPSIS)
1000.0000 mL | Freq: Once | INTRAVENOUS | Status: AC
  Administered 2017-09-19: 1000 mL via INTRAVENOUS

## 2017-09-19 MED ORDER — HYDRALAZINE HCL 20 MG/ML IJ SOLN: 10.0000 mg | INTRAMUSCULAR | Status: DC | PRN

## 2017-09-19 MED ORDER — CARVEDILOL 3.125 MG PO TABS
6.2500 mg | ORAL_TABLET | Freq: Two times a day (BID) | ORAL | Status: DC
  Administered 2017-09-20 – 2017-09-21 (×2): 6.25 mg via ORAL
  Filled 2017-09-19 (×3): qty 2

## 2017-09-19 MED ORDER — LOSARTAN POTASSIUM 50 MG PO TABS
100.0000 mg | ORAL_TABLET | Freq: Every day | ORAL | Status: DC
  Administered 2017-09-20 – 2017-09-21 (×2): 100 mg via ORAL
  Filled 2017-09-19 (×2): qty 2

## 2017-09-19 MED ORDER — POTASSIUM CHLORIDE IN NACL 20-0.45 MEQ/L-% IV SOLN
INTRAVENOUS | Status: DC
  Administered 2017-09-20: 02:00:00 via INTRAVENOUS
  Filled 2017-09-19: qty 1000

## 2017-09-19 MED ORDER — INSULIN ASPART 100 UNIT/ML ~~LOC~~ SOLN: 0.0000 [IU] | Freq: Every day | SUBCUTANEOUS | Status: DC

## 2017-09-19 MED ORDER — ISOSORBIDE MONONITRATE ER 30 MG PO TB24
60.0000 mg | ORAL_TABLET | Freq: Every day | ORAL | Status: DC
Start: 2017-09-20 — End: 2017-09-21
  Administered 2017-09-20 – 2017-09-21 (×2): 60 mg via ORAL
  Filled 2017-09-19 (×2): qty 2

## 2017-09-19 MED ORDER — DEXTROSE 5 % IV SOLN: 500.0000 mg | INTRAVENOUS | Status: DC

## 2017-09-19 MED ORDER — ASPIRIN EC 81 MG PO TBEC
81.0000 mg | DELAYED_RELEASE_TABLET | Freq: Every day | ORAL | Status: DC
  Administered 2017-09-20 – 2017-09-21 (×2): 81 mg via ORAL
  Filled 2017-09-19 (×2): qty 1

## 2017-09-19 NOTE — H&P (Signed)
[facility] - [place] [facility]   PATIENT NAME: [name]    MR#:  [id number]  DATE OF BIRTH:  [birth year]  DATE OF ADMISSION:  09/19/2017  PRIMARY CARE PHYSICIAN: [name], MD   REQUESTING/REFERRING PHYSICIAN:   CHIEF COMPLAINT:   Chief Complaint  Patient presents with  . Fall    HISTORY OF PRESENT ILLNESS: [name]  is a 83 y.o. female with a known history per below presented to the emergency room status post fall around 3 PM today, found by family, episode of emesis, brought by EMS to the emergency room, noted fever to 101.9 on EMS evaluation with O2 saturation in the 80s, patient noted to be confused, ER workup noted for potassium 3.2, creatinine 1.6, chest x-ray noted for pneumonia, patient evaluated emergency room, no apparent distress, multiple family members at the bedside, patient now been admitted for acute community-acquired pneumonia, hypokalemia.  PAST MEDICAL HISTORY:   Past Medical History:  Diagnosis Date  . Cervical disc disease   . Chronic kidney disease    stage 3  . COPD (chronic obstructive pulmonary disease) (HCC)   . Cystitis   . Degenerative joint disease   . Diabetes mellitus without complication (HCC)    DM type 2  . Diabetic retinopathy associated with type 2 diabetes mellitus, without macular edema   . Diverticulosis of colon    without mention of hemorrhage  . Gastric ulcer   . GERD (gastroesophageal reflux disease)   . Hyperlipemia   . Hypertension   . Mitral valve prolapse   . Premature atrial contraction   . Sick sinus syndrome (HCC)   . Sinoatrial node dysfunction (HCC)     PAST SURGICAL HISTORY:  Past Surgical History:  Procedure Laterality Date  . ABDOMINAL HYSTERECTOMY    . BACK SURGERY     lumbar back surgery  . COLONOSCOPY    . ESOPHAGOGASTRODUODENOSCOPY    . gastric ulcer surgery    . KIDNEY SURGERY    . pacemaker     insertion dual chamber pacemaker generator  . PARATHYROIDECTOMY    .  THYROIDECTOMY, PARTIAL     lobectomy partial thyroid    SOCIAL HISTORY:  Social History   Tobacco Use  . Smoking status: Never Smoker  . Smokeless tobacco: Never Used  Substance Use Topics  . Alcohol use: No    Alcohol/week: 0.0 oz    FAMILY HISTORY:  Family History  Problem Relation Age of Onset  . Breast cancer Mother 59  . Breast cancer Other   . Breast cancer Maternal Aunt        2 mat aunts    DRUG ALLERGIES:  Allergies  Allergen Reactions  . Darvon [Propoxyphene] Nausea And Vomiting  . Hydrocodone Nausea And Vomiting  . Janumet [Sitagliptin-Metformin Hcl] Nausea And Vomiting  . Motrin [Ibuprofen] Nausea And Vomiting  . Tramadol Nausea And Vomiting    Can tolerate this medication in small doses (takes half a pill).    REVIEW OF SYSTEMS:   CONSTITUTIONAL: No fever, fatigue or weakness.  EYES: No blurred or double vision.  EARS, NOSE, AND THROAT: No tinnitus or ear pain.  RESPIRATORY:+ cough, no shortness of breath, wheezing or hemoptysis.  CARDIOVASCULAR: No chest pain, orthopnea, edema.  GASTROINTESTINAL: No nausea, vomiting, diarrhea or abdominal pain.  GENITOURINARY: No dysuria, hematuria.  ENDOCRINE: No polyuria, nocturia,  HEMATOLOGY: No anemia, easy bruising or bleeding SKIN: No rash or lesion. MUSCULOSKELETAL: No joint pain or arthritis.  NEUROLOGIC: No tingling, numbness, weakness.  PSYCHIATRY: No anxiety or depression.   MEDICATIONS AT HOME:  Prior to Admission medications   Medication Sig Start Date End Date Taking? Authorizing Provider  acetaminophen (TYLENOL) 500 MG tablet Take 1,000 mg by mouth every 8 (eight) hours as needed.   Yes [provider]  aspirin EC 81 MG tablet Take 81 mg by mouth daily.   Yes [provider]  atorvastatin (LIPITOR) 20 MG tablet Take 20 mg by mouth daily.   Yes [provider]  budesonide-formoterol (SYMBICORT) 160-4.5 MCG/ACT inhaler Inhale 1 puff into the lungs 2 (two) times daily as  needed. 07/17/17  Yes [provider]  carboxymethylcellulose (REFRESH TEARS) 0.5 % SOLN Apply to eye.   Yes [provider]  carvedilol (COREG) 3.125 MG tablet Take 6.25 mg by mouth 2 (two) times daily with a meal.   Yes [provider]  furosemide (LASIX) 20 MG tablet Take 20 mg by mouth daily.   Yes [provider]  gabapentin (NEURONTIN) 300 MG capsule Take 1 capsule (300 mg total) by mouth 2 (two) times daily. 09/16/17  Yes Plonk, Chrissie NoaWilliam, MD  glipiZIDE (GLUCOTROL XL) 5 MG 24 hr tablet Take 10 mg by mouth 2 (two) times daily.    Yes [provider]  glucose blood (PRECISION QID TEST) test strip Use 2 (two) times daily. True metrix meter. Diagnosis: E11.29 06/01/17 06/01/18 Yes [provider]  isosorbide mononitrate (IMDUR) 60 MG 24 hr tablet Take 60 mg by mouth daily.    Yes [provider]  losartan (COZAAR) 100 MG tablet Take 100 mg by mouth daily.  03/20/17 03/20/18 Yes [provider]  meloxicam (MOBIC) 7.5 MG tablet Take 1 tablet by mouth daily. 09/11/17  Yes [provider]  pioglitazone (ACTOS) 30 MG tablet Take 30 mg by mouth daily.   Yes [provider]  vitamin B-12 (CYANOCOBALAMIN) 1000 MCG tablet Take 1,000 mcg by mouth daily.   Yes [provider]      PHYSICAL EXAMINATION:   VITAL SIGNS: Blood pressure (!) 152/61, pulse 62, temperature 98.7 F (37.1 C), temperature source Oral, resp. rate 18, height 5\' 4"  (1.626 m), weight 63.5 kg (140 lb), SpO2 93 %.  GENERAL:  83 y.o.-year-old patient lying in the bed with no acute distress.  Obese, nontoxic-appearing EYES: Pupils equal, round, reactive to light and accommodation. No scleral icterus. Extraocular muscles intact.  HEENT: Head atraumatic, normocephalic. Oropharynx and nasopharynx clear.  NECK:  Supple, no jugular venous distention. No thyroid enlargement, no tenderness.  LUNGS: Diminished breath sounds bilaterally. No use of  accessory muscles of respiration.  CARDIOVASCULAR: S1, S2 normal. No murmurs, rubs, or gallops.  ABDOMEN: Soft, nontender, nondistended. Bowel sounds present. No organomegaly or mass.  EXTREMITIES: No pedal edema, cyanosis, or clubbing.  NEUROLOGIC: Cranial nerves II through XII are intact. MAES. Gait not checked.  PSYCHIATRIC: The patient is alert and oriented x 2-3.  SKIN: No obvious rash, lesion, or ulcer.   LABORATORY PANEL:   CBC Recent Labs  Lab 09/19/17 2012  WBC 6.7  HGB 13.1  HCT 40.1  PLT 137*  MCV 86.7  MCH 28.3  MCHC 32.6  RDW 14.9*  LYMPHSABS 0.4*  MONOABS 0.4  EOSABS 0.1  BASOSABS 0.0   ------------------------------------------------------------------------------------------------------------------  Chemistries  Recent Labs  Lab 09/19/17 2012  NA 139  K 3.2*  CL 101  CO2 28  GLUCOSE 104*  BUN 23*  CREATININE 1.61*  CALCIUM 9.6  AST  25  ALT 27  ALKPHOS 60  BILITOT 1.0   ------------------------------------------------------------------------------------------------------------------ estimated creatinine clearance is 22.9 mL/min (A) (by C-G formula based on SCr of 1.61 mg/dL (H)). ------------------------------------------------------------------------------------------------------------------ No results for input(s): TSH, T4TOTAL, T3FREE, THYROIDAB in the last 72 hours.  Invalid input(s): FREET3   Coagulation profile Recent Labs  Lab 09/19/17 2012  INR 0.92   ------------------------------------------------------------------------------------------------------------------- No results for input(s): DDIMER in the last 72 hours. -------------------------------------------------------------------------------------------------------------------  Cardiac Enzymes Recent Labs  Lab 09/19/17 2012  TROPONINI 0.04*   ------------------------------------------------------------------------------------------------------------------ Invalid  input(s): POCBNP  ---------------------------------------------------------------------------------------------------------------  Urinalysis    Component Value Date/Time   COLORURINE YELLOW (A) 09/19/2017 1825   APPEARANCEUR HAZY (A) 09/19/2017 1825   LABSPEC 1.010 09/19/2017 1825   PHURINE 7.0 09/19/2017 1825   GLUCOSEU NEGATIVE 09/19/2017 1825   HGBUR NEGATIVE 09/19/2017 1825   BILIRUBINUR NEGATIVE 09/19/2017 1825   KETONESUR NEGATIVE 09/19/2017 1825   PROTEINUR NEGATIVE 09/19/2017 1825   UROBILINOGEN 0.2 06/09/2007 1357   NITRITE NEGATIVE 09/19/2017 1825   LEUKOCYTESUR NEGATIVE 09/19/2017 1825     RADIOLOGY: Ct Head Wo Contrast  Result Date: 09/19/2017 CLINICAL DATA:  Unwitnessed fall today. Found down. Unexplained altered level consciousness. EXAM: CT HEAD WITHOUT CONTRAST TECHNIQUE: Contiguous axial images were obtained from the base of the skull through the vertex without intravenous contrast. COMPARISON:  None. FINDINGS: Brain: No evidence of acute infarction, hemorrhage, hydrocephalus, extra-axial collection, or mass lesion/mass effect. Mild diffuse cerebral atrophy and chronic small vessel disease. Vascular:  No hyperdense vessel or other acute findings. Skull: No evidence of fracture or other significant bone abnormality. Sinuses/Orbits: No acute findings. Mild chronic mucosal thickening involving bilateral ethmoid sinuses. Other: None. IMPRESSION: No acute intracranial abnormality. Mild cerebral atrophy and chronic small vessel disease. Electronically Signed   By: Myles Rosenthal M.D.   On: 09/19/2017 19:32   Dg Chest Port 1 View  Result Date: 09/19/2017 CLINICAL DATA:  Code sepsis.  Weakness and confusion. EXAM: PORTABLE CHEST 1 VIEW COMPARISON:  03/05/2016 FINDINGS: Stable cardiomegaly with aortic atherosclerosis. Left-sided pacemaker apparatus with right atrial and right ventricular leads. Surgical clips are seen at the GE juncture. Confluent opacity in the right upper lobe  suspicious for pneumonia. No overt pulmonary edema. No effusion. No acute osseous abnormality. IMPRESSION: 1. Pneumonic consolidation in the right upper lobe. 2. Stable cardiomegaly with aortic atherosclerosis. Electronically Signed   By: Tollie Eth M.D.   On: 09/19/2017 19:16    EKG: Orders placed or performed during the hospital encounter of 09/19/17  . ED EKG 12-Lead  . ED EKG 12-Lead    IMPRESSION AND PLAN: 1 acute community-acquired pneumonia Admit to regular nursing floor bed on pneumonia protocol, empiric Rocephin/azithromycin for 5-7-day course, follow-up on cultures, supplemental oxygen as needed  2 acute hypokalemia Replete with IV fluids/p.o. potassium, check magnesium/BMP in the morning  3 chronic diabetes mellitus type 2 Stable Continue home regiment, sliding scale insulin with Accu-Cheks per routine while in house, cardiac/ADA diet  4 chronic benign essential hypertension Reasonably controlled Continue home regiment, as needed hydralazine for systolic blood pressure greater than 160, vitals per routine, make changes as per necessary  5 acute fall Most likely secondary to above Placed on fall precautions, PT/OT to evaluate/treat  Full code Condition stable Prognosis fair DVT prophylaxis with heparin subcu Disposition home in 2-3 days   All the records are reviewed and case discussed with ED provider. Management plans discussed with the patient, family and they are in agreement.  CODE STATUS: Code Status History  This patient does not have a recorded code status. Please follow your organizational policy for patients in this situation.       TOTAL TIME TAKING CARE OF THIS PATIENT: 35 minutes.    Evelena AsaMontell D Uzziel Russey M.D on 09/19/2017   Between 7am to 6pm - Pager - [phone]  After 6pm go to [email or web address] - password [email or web address]  [facility] [place] [facility]  Office  [phone]  CC: Primary care physician; Danella PentonMiller, Mark F, MD   Note: This  dictation was prepared with Dragon dictation along with smaller phrase technology. Any transcriptional errors that result from this process are unintentional.

## 2017-09-19 NOTE — ED Notes (Signed)
Date and time results received: 09/19/17 2053 (use smartphrase ".now" to insert current time)  Test: Troponin Critical Value: 0.04  Name of Provider Notified: Dr. Lamont Snowballifenbark  Orders Received? Or Actions Taken?:

## 2017-09-19 NOTE — ED Triage Notes (Signed)
Pt arrived via EMS from Home with complaints of a fall. Family found her in the floor and  told EMS that pt must have fallen some time between 3:00pm and 5:30pm. Pt lives alone. Pt  Is alert and oriented to her birth day, age, and name. Pt denies any pain at this time. Daughter states that pt vomited in floor at home and urinated everywhere. VS per EMS temp-101.9 BP-140/70 BS-100 O2 sat was in 80s RA so Ems placed pt on 3L nasal cannula. Pt has a congested cough. Pt has Hx of Chronic back pain. Pt has a pacemaker. Family at the bedside.

## 2017-09-19 NOTE — Progress Notes (Signed)
Pharmacy Antibiotic Note  Dub MikesLouise Yarbor Righi is a 83 y.o. female admitted on 09/19/2017 with CAP.  Pharmacy has been consulted for azithromycin/ceftriaxone dosing.  Plan: Patient received vanc 1g and zosyn 3.375g IV x 1 in ED  Given patient has a CURB-65 = 2 and no prior inpatient visits within 3 months will treat patient for CAP. Will start azithromycin 500 mg IV daily for 5 days Will start ceftriaxone 1g IV daily for 5 days  Height: 5\' 4"  (162.6 cm) Weight: 140 lb (63.5 kg) IBW/kg (Calculated) : 54.7  Temp (24hrs), Avg:99.6 F (37.6 C), Min:98.7 F (37.1 C), Max:100.5 F (38.1 C)  Recent Labs  Lab 09/19/17 2012  WBC 6.7  CREATININE 1.61*  LATICACIDVEN 1.0    Estimated Creatinine Clearance: 22.9 mL/min (A) (by C-G formula based on SCr of 1.61 mg/dL (H)).    Allergies  Allergen Reactions  . Darvon [Propoxyphene] Nausea And Vomiting  . Hydrocodone Nausea And Vomiting  . Janumet [Sitagliptin-Metformin Hcl] Nausea And Vomiting  . Motrin [Ibuprofen] Nausea And Vomiting  . Tramadol Nausea And Vomiting    Can tolerate this medication in small doses (takes half a pill).    Thank you for allowing pharmacy to be a part of this patient's care.  Thomasene Rippleavid Maisha Bogen, PharmD, BCPS Clinical Pharmacist 09/19/2017

## 2017-09-19 NOTE — ED Notes (Signed)
Called floor to give report, they said they will call back in 10 minutes.

## 2017-09-19 NOTE — ED Notes (Signed)
Cut off pt gown. Gown was saturated in urine. Asked pt permission before cutting, pt agreed.

## 2017-09-19 NOTE — ED Notes (Signed)
Called pharmacy to ask about sending up acetaminophen for pt.

## 2017-09-19 NOTE — ED Notes (Signed)
Pt transported to room 105 

## 2017-09-19 NOTE — ED Provider Notes (Signed)
[facility] Emergency Department Provider Note  ____________________________________________   First MD Initiated Contact with Patient 09/19/17 1823     (approximate)  I have reviewed the triage vital signs and the nursing notes.   HISTORY  Chief Complaint Fall  Level 5 exemption history limited by the patient's clinical condition  HPI [name] is a 83 y.o. female who comes to the emergency department after being found down in her home during a wellness check by family.  Apparently family had talked to her roughly 45 minutes prior to this event and she was relatively normal.  She has been fighting an upper respiratory tract infection recently.  When EMS arrived they noted her temperature was 101.9 degrees and that she had soaked the gurney in urine.  Further history limited by the patient's confusion.   Past Medical History:  Diagnosis Date  . Cervical disc disease   . Chronic kidney disease    stage 3  . COPD (chronic obstructive pulmonary disease) (HCC)   . Cystitis   . Degenerative joint disease   . Diabetes mellitus without complication (HCC)    DM type 2  . Diabetic retinopathy associated with type 2 diabetes mellitus, without macular edema   . Diverticulosis of colon    without mention of hemorrhage  . Gastric ulcer   . GERD (gastroesophageal reflux disease)   . Hyperlipemia   . Hypertension   . Mitral valve prolapse   . Premature atrial contraction   . Sick sinus syndrome (HCC)   . Sinoatrial node dysfunction [facility])     Patient Active Problem List   Diagnosis Date Noted  . CAP (community acquired pneumonia) 09/19/2017  . History of peptic ulcer disease 09/16/2017  . Gait instability 09/16/2017  . Obesity (BMI 30-39.9) 03/10/2017  . Trochanteric bursitis of left hip 03/10/2017  . CKD (chronic kidney disease) stage 3, GFR 30-59 ml/min (HCC) 12/31/2016  . Moderate tricuspid insufficiency 12/31/2016  . B12 deficiency  05/20/2016  . Moderate mitral insufficiency 12/25/2015  . Bilateral carotid artery stenosis 11/27/2015  . Chronic venous insufficiency 10/09/2015  . LVH (left ventricular hypertrophy) due to hypertensive disease, with heart failure (HCC) 10/09/2015  . Controlled type 2 diabetes mellitus with stage 3 chronic kidney disease (HCC) 04/06/2015  . Edema leg 04/05/2015  . Hypertensive left ventricular hypertrophy 04/05/2015  . DDD (degenerative disc disease), lumbar 12/13/2014  . Neuritis or radiculitis due to rupture of lumbar intervertebral disc 12/13/2014  . Breathlessness on exertion 10/17/2014  . Hyperlipidemia due to type 2 diabetes mellitus (HCC) 08/15/2014  . COPD, mild (HCC) 08/14/2014  . Bladder infection, chronic 07/05/2014  . Incomplete bladder emptying 07/05/2014  . Neuralgia neuritis, sciatic nerve 07/05/2014  . Urge incontinence 07/05/2014  . FOM (frequency of micturition) 07/05/2014  . Benign essential HTN 06/14/2014  . Lumbar canal stenosis 06/14/2014  . SA node dysfunction (HCC) 06/14/2014  . Hypertension associated with diabetes (HCC) 06/14/2014    Past Surgical History:  Procedure Laterality Date  . ABDOMINAL HYSTERECTOMY    . BACK SURGERY     lumbar back surgery  . COLONOSCOPY    . ESOPHAGOGASTRODUODENOSCOPY    . gastric ulcer surgery    . KIDNEY SURGERY    . pacemaker     insertion dual chamber pacemaker generator  . PARATHYROIDECTOMY    . THYROIDECTOMY, PARTIAL     lobectomy partial thyroid    Prior to Admission medications   Medication Sig Start Date End Date Taking? Authorizing  Provider  acetaminophen (TYLENOL) 500 MG tablet Take 1,000 mg by mouth every 8 (eight) hours as needed.   Yes [provider]  aspirin EC 81 MG tablet Take 81 mg by mouth daily.   Yes [provider]  atorvastatin (LIPITOR) 20 MG tablet Take 20 mg by mouth daily.   Yes [provider]  budesonide-formoterol (SYMBICORT) 160-4.5 MCG/ACT inhaler Inhale 1  puff into the lungs 2 (two) times daily as needed. 07/17/17  Yes [provider]  carboxymethylcellulose (REFRESH TEARS) 0.5 % SOLN Apply to eye.   Yes [provider]  carvedilol (COREG) 3.125 MG tablet Take 6.25 mg by mouth 2 (two) times daily with a meal.   Yes [provider]  furosemide (LASIX) 20 MG tablet Take 20 mg by mouth daily.   Yes [provider]  gabapentin (NEURONTIN) 300 MG capsule Take 1 capsule (300 mg total) by mouth 2 (two) times daily. 09/16/17  Yes Plonk, Chrissie Noa, MD  glipiZIDE (GLUCOTROL XL) 5 MG 24 hr tablet Take 10 mg by mouth 2 (two) times daily.    Yes [provider]  glucose blood (PRECISION QID TEST) test strip Use 2 (two) times daily. True metrix meter. Diagnosis: E11.29 06/01/17 06/01/18 Yes [provider]  isosorbide mononitrate (IMDUR) 60 MG 24 hr tablet Take 60 mg by mouth daily.    Yes [provider]  losartan (COZAAR) 100 MG tablet Take 100 mg by mouth daily.  03/20/17 03/20/18 Yes [provider]  meloxicam (MOBIC) 7.5 MG tablet Take 1 tablet by mouth daily. 09/11/17  Yes [provider]  pioglitazone (ACTOS) 30 MG tablet Take 30 mg by mouth daily.   Yes [provider]  vitamin B-12 (CYANOCOBALAMIN) 1000 MCG tablet Take 1,000 mcg by mouth daily.   Yes [provider]    Allergies Darvon [propoxyphene]; Hydrocodone; Janumet [sitagliptin-metformin hcl]; Motrin [ibuprofen]; and Tramadol  Family History  Problem Relation Age of Onset  . Breast cancer Mother 24  . Breast cancer Other   . Breast cancer Maternal Aunt        2 mat aunts    Social History Social History   Tobacco Use  . Smoking status: Never Smoker  . Smokeless tobacco: Never Used  Substance Use Topics  . Alcohol use: No    Alcohol/week: 0.0 oz  . Drug use: No    Review of Systems Level 5 exemption history limited by the patient's clinical  condition ____________________________________________   PHYSICAL EXAM:  VITAL SIGNS: ED Triage Vitals [09/19/17 1823]  Enc Vitals Group     BP (!) 166/73     Pulse Rate 70     Resp 18     Temp 98.7 F (37.1 C)     Temp Source Oral     SpO2 93 %     Weight      Height      Head Circumference      Peak Flow      Pain Score      Pain Loc      Pain Edu?      Excl. in GC?     Constitutional: Alert and oriented to her name although does not know the year and thinks it is 1938 Eyes: PERRL EOMI. pupils midrange and brisk  Head: Atraumatic. Nose: No congestion/rhinnorhea. Mouth/Throat: No trismus Neck: No stridor.  No meningismus Cardiovascular: Normal rate, regular rhythm. Grossly normal heart sounds.  Good peripheral circulation. Respiratory: Increased respiratory effort with some  rhonchi on the right side although moving good air Gastrointestinal: Soft nontender Musculoskeletal: No lower extremity edema   Neurologic: Moves all 4 feels all 4 Skin:  Skin is warm, dry and intact. No rash noted. Psychiatric: Significantly confused    ____________________________________________   DIFFERENTIAL includes but not limited to  Delirium, dementia, sepsis, urinary tract infection, meningitis, encephalitis, upper respiratory tract infection, pneumonia, influenza ____________________________________________   LABS (all labs ordered are listed, but only abnormal results are displayed)  Labs Reviewed  COMPREHENSIVE METABOLIC PANEL - Abnormal; Notable for the following components:      Result Value   Potassium 3.2 (*)    Glucose, Bld 104 (*)    BUN 23 (*)    Creatinine, Ser 1.61 (*)    GFR calc non Af Amer 28 (*)    GFR calc Af Amer 33 (*)    All other components within normal limits  TROPONIN I - Abnormal; Notable for the following components:   Troponin I 0.04 (*)    All other components within normal limits  CBC WITH DIFFERENTIAL/PLATELET - Abnormal; Notable for the  following components:   RDW 14.9 (*)    Platelets 137 (*)    Lymphs Abs 0.4 (*)    All other components within normal limits  URINALYSIS, ROUTINE W REFLEX MICROSCOPIC - Abnormal; Notable for the following components:   Color, Urine YELLOW (*)    APPearance HAZY (*)    All other components within normal limits  GLUCOSE, CAPILLARY - Abnormal; Notable for the following components:   Glucose-Capillary 129 (*)    All other components within normal limits  BASIC METABOLIC PANEL - Abnormal; Notable for the following components:   Glucose, Bld 225 (*)    BUN 23 (*)    Creatinine, Ser 1.53 (*)    Calcium 8.6 (*)    GFR calc non Af Amer 30 (*)    GFR calc Af Amer 35 (*)    All other components within normal limits  GLUCOSE, CAPILLARY - Abnormal; Notable for the following components:   Glucose-Capillary 156 (*)    All other components within normal limits  GLUCOSE, CAPILLARY - Abnormal; Notable for the following components:   Glucose-Capillary 153 (*)    All other components within normal limits  CULTURE, BLOOD (ROUTINE X 2)  CULTURE, BLOOD (ROUTINE X 2)  CULTURE, BLOOD (ROUTINE X 2)  CULTURE, BLOOD (ROUTINE X 2)  URINE CULTURE  CULTURE, EXPECTORATED SPUTUM-ASSESSMENT  GRAM STAIN  LACTIC ACID, PLASMA  LIPASE, BLOOD  PROCALCITONIN  PROTIME-INR  INFLUENZA PANEL BY PCR (TYPE A & B)  STREP PNEUMONIAE URINARY ANTIGEN  MAGNESIUM  LEGIONELLA PNEUMOPHILA SEROGP 1 UR AG  HIV ANTIBODY (ROUTINE TESTING)    Blood work reviewed by me shows elevated troponin likely secondary to demand and not true primary myocardial ischemia __________________________________________  EKG   ____________________________________________  RADIOLOGY  Chest x-ray reviewed by me consistent with right upper lobe pneumonia.  Head CT reviewed by me with no acute disease ____________________________________________   PROCEDURES  Procedure(s) performed: no  Procedures  Critical Care  performed:yes  CRITICAL CARE Performed by: Merrily BrittleNeil Donnette Macmullen   Total critical care time: 35 minutes  Critical care time was exclusive of separately billable procedures and treating other patients.  Critical care was necessary to treat or prevent imminent or life-threatening deterioration.  Critical care was time spent personally by me on the following activities: development of treatment plan with patient and/or surrogate as well as nursing, discussions with consultants, evaluation  of patient's response to treatment, examination of patient, obtaining history from patient or surrogate, ordering and performing treatments and interventions, ordering and review of laboratory studies, ordering and review of radiographic studies, pulse oximetry and re-evaluation of patient's condition.   Observation: no ____________________________________________   INITIAL IMPRESSION / ASSESSMENT AND PLAN / ED COURSE  Pertinent labs & imaging results that were available during my care of the patient were reviewed by me and considered in my medical decision making (see chart for details).  Differential on a febrile confused 83 year old woman is extremely broad.  We will treat her as sepsis of unknown etiology now.  In and out catheterization for urine as well as chest x-ray influenza and head CT are pending.     The patient's head CT is fortunately negative however her chest x-ray is suggestive of right upper lobe pneumonia.  While she is saturating okay, she is clearly delirious and has had multiple falls at home.  At this point she requires inpatient admission for IV antibiotics, IV fluids, and serial examinations.  I discussed the case with on-call hospitalist Dr. Katheren ShamsSalary who is graciously agreed to admit the patient to his service. ____________________________________________   FINAL CLINICAL IMPRESSION(S) / ED DIAGNOSES  Final diagnoses:  Community acquired pneumonia of right upper lobe of lung (HCC)   Sepsis, due to unspecified organism (HCC)  Delirium      NEW MEDICATIONS STARTED DURING THIS VISIT:  This SmartLink is deprecated. Use AVSMEDLIST instead to display the medication list for a patient.   Note:  This document was prepared using Dragon voice recognition software and may include unintentional dictation errors.     Merrily Brittleifenbark, Torryn Fiske, MD 09/20/17 [phone]

## 2017-09-20 ENCOUNTER — Other Ambulatory Visit: Payer: Self-pay

## 2017-09-20 LAB — BASIC METABOLIC PANEL
Anion gap: 10 (ref 5–15)
BUN: 23 mg/dL — AB (ref 6–20)
CALCIUM: 8.6 mg/dL — AB (ref 8.9–10.3)
CHLORIDE: 102 mmol/L (ref 101–111)
CO2: 25 mmol/L (ref 22–32)
CREATININE: 1.53 mg/dL — AB (ref 0.44–1.00)
GFR calc Af Amer: 35 mL/min — ABNORMAL LOW (ref >60)
GFR calc non Af Amer: 30 mL/min — ABNORMAL LOW (ref >60)
Glucose, Bld: 225 mg/dL — ABNORMAL HIGH (ref 65–99)
Potassium: 3.7 mmol/L (ref 3.5–5.1)
SODIUM: 137 mmol/L (ref 135–145)

## 2017-09-20 LAB — MAGNESIUM: MAGNESIUM: 1.7 mg/dL (ref 1.7–2.4)

## 2017-09-20 LAB — GLUCOSE, CAPILLARY
GLUCOSE-CAPILLARY: 120 mg/dL — AB (ref 65–99)
Glucose-Capillary: 156 mg/dL — ABNORMAL HIGH (ref 65–99)
Glucose-Capillary: 153 mg/dL — ABNORMAL HIGH (ref 65–99)
Glucose-Capillary: 183 mg/dL — ABNORMAL HIGH (ref 65–99)

## 2017-09-20 LAB — STREP PNEUMONIAE URINARY ANTIGEN: STREP PNEUMO URINARY ANTIGEN: NEGATIVE

## 2017-09-20 NOTE — Progress Notes (Signed)
[employer] [facility] [place] [facility]   PATIENT NAME: [name]    MR#:  [id number]  DATE OF BIRTH:  01/04/1931  SUBJECTIVE:  Feeling better this am  REVIEW OF SYSTEMS:    Review of Systems  Constitutional: Negative for fever, chills weight loss HENT: Negative for ear pain, nosebleeds, congestion, facial swelling, rhinorrhea, neck pain, neck stiffness and ear discharge.   Respiratory: ++ for cough, NO shortness of breath, wheezing  Cardiovascular: Negative for chest pain, palpitations and leg swelling.  Gastrointestinal: Negative for heartburn, abdominal pain, vomiting, diarrhea or consitpation Genitourinary: Negative for dysuria, urgency, frequency, hematuria Musculoskeletal: Negative for back pain or joint pain Neurological: Negative for dizziness, seizures, syncope, focal weakness,  numbness and headaches.  Hematological: Does not bruise/bleed easily.  Psychiatric/Behavioral: Negative for hallucinations, confusion, dysphoric mood    Tolerating Diet: yes      DRUG ALLERGIES:   Allergies  Allergen Reactions  . Darvon [Propoxyphene] Nausea And Vomiting  . Hydrocodone Nausea And Vomiting  . Janumet [Sitagliptin-Metformin Hcl] Nausea And Vomiting  . Motrin [Ibuprofen] Nausea And Vomiting  . Tramadol Nausea And Vomiting    Can tolerate this medication in small doses (takes half a pill).    VITALS:  Blood pressure (!) 121/48, pulse 60, temperature 99.3 F (37.4 C), temperature source Oral, resp. rate 18, height 5\' 4"  (1.626 m), weight 63.5 kg (140 lb), SpO2 96 %.  PHYSICAL EXAMINATION:  Constitutional: Appears well-developed and well-nourished. No distress. HENT: Normocephalic. Marland Kitchen. Oropharynx is clear and moist.  Eyes: Conjunctivae and EOM are normal. PERRLA, no scleral icterus.  Neck: Normal ROM. Neck supple. No JVD. No tracheal deviation. CVS: RRR, S1/S2 +, no murmurs, no gallops, no carotid bruit.  Pulmonary: Effort and breath sounds normal, no  stridor, rhonchi, wheezes, rales.  Abdominal: Soft. BS +,  no distension, tenderness, rebound or guarding.  Musculoskeletal: Normal range of motion. No edema and no tenderness.  Neuro: Alert. CN 2-12 grossly intact. No focal deficits. Skin: Skin is warm and dry. No rash noted. Psychiatric: Normal mood and affect.      LABORATORY PANEL:   CBC Recent Labs  Lab 09/19/17 2012  WBC 6.7  HGB 13.1  HCT 40.1  PLT 137*   ------------------------------------------------------------------------------------------------------------------  Chemistries  Recent Labs  Lab 09/19/17 2012 09/20/17 0005  NA 139 137  K 3.2* 3.7  CL 101 102  CO2 28 25  GLUCOSE 104* 225*  BUN 23* 23*  CREATININE 1.61* 1.53*  CALCIUM 9.6 8.6*  MG  --  1.7  AST 25  --   ALT 27  --   ALKPHOS 60  --   BILITOT 1.0  --    ------------------------------------------------------------------------------------------------------------------  Cardiac Enzymes Recent Labs  Lab 09/19/17 2012  TROPONINI 0.04*   ------------------------------------------------------------------------------------------------------------------  RADIOLOGY:  Ct Head Wo Contrast  Result Date: 09/19/2017 CLINICAL DATA:  Unwitnessed fall today. Found down. Unexplained altered level consciousness. EXAM: CT HEAD WITHOUT CONTRAST TECHNIQUE: Contiguous axial images were obtained from the base of the skull through the vertex without intravenous contrast. COMPARISON:  None. FINDINGS: Brain: No evidence of acute infarction, hemorrhage, hydrocephalus, extra-axial collection, or mass lesion/mass effect. Mild diffuse cerebral atrophy and chronic small vessel disease. Vascular:  No hyperdense vessel or other acute findings. Skull: No evidence of fracture or other significant bone abnormality. Sinuses/Orbits: No acute findings. Mild chronic mucosal thickening involving bilateral ethmoid sinuses. Other: None. IMPRESSION: No acute intracranial abnormality.  Mild cerebral atrophy and chronic small vessel disease. Electronically Signed  By: Myles RosenthalJohn  Stahl M.D.   On: 09/19/2017 19:32   Dg Chest Port 1 View  Result Date: 09/19/2017 CLINICAL DATA:  Code sepsis.  Weakness and confusion. EXAM: PORTABLE CHEST 1 VIEW COMPARISON:  03/05/2016 FINDINGS: Stable cardiomegaly with aortic atherosclerosis. Left-sided pacemaker apparatus with right atrial and right ventricular leads. Surgical clips are seen at the GE juncture. Confluent opacity in the right upper lobe suspicious for pneumonia. No overt pulmonary edema. No effusion. No acute osseous abnormality. IMPRESSION: 1. Pneumonic consolidation in the right upper lobe. 2. Stable cardiomegaly with aortic atherosclerosis. Electronically Signed   By: Tollie Ethavid  Kwon M.D.   On: 09/19/2017 19:16     ASSESSMENT AND PLAN:   83 year old female with a history of diabetes and essential hypertension who presented to the emergency room after a fall and found to have a fever and hypoxia.  1. Acute hypoxic respiratory failure in the setting of a community acquired pneumonia Oxygen is to be weaned to room air today  2. Community acquired pneumonia: Continue Rocephin and azithromycin Follow-up on final blood cultures  3. Hypokalemia: This has been repleted.  4. Diabetes: Continue glipizide, Actos with ADA diet and sliding scale  5. Essential hypertension: Continue losartan, isosorbide and Coreg   PT evaluation for discharge planning  Management plans discussed with the patient and she  is in agreement.  CODE STATUS: Full  TOTAL TIME TAKING CARE OF THIS PATIENT: 30  minutes.     POSSIBLE D/C tomorrow, DEPENDING ON CLINICAL CONDITION.   Lamara Brecht M.D on 09/20/2017 at 9:51 AM  Between 7am to 6pm - Pager - [phone] After 6pm go to [email or web address] - password [email or web address]  [facility]  Office  [phone]  CC: Primary care physician; Danella PentonMiller, Mark F, MD  Note: This dictation was prepared with  Dragon dictation along with smaller phrase technology. Any transcriptional errors that result from this process are unintentional.

## 2017-09-20 NOTE — Evaluation (Signed)
Physical Therapy Evaluation Patient Details Name: [name] MRN: [id number] DOB: 01/27/1931 Today's Date: 09/20/2017   History of Present Illness  Patient is a pleasant 83 year old female who presents to hospital from a fall. Pt's family found her in an episode of emesis on the floor. Her temperature when taken in the ER was 101.9 with 02 in the 80's. She was found to have pneumonia.   Clinical Impression  Patient is a pleasant 83 year old female who presented to hospital s/p fall with pneumonia. Pt. Has generalized weakness of BLE's due to recent illness and requires occasional single hand support from wall or PT when ambulating to retain balance. Patient able to stand without UE support and reach within and outside of BOS without LOB. Pt. And family educated on importance of upright posture to decrease fall risk when ambulating. Educated family on importance of having someone come and help at the house if patient is to return home, potentially also using life alert or other such method additionally.  Patient ambulated with wide BOS and slower velocity with CGA at this time. Patient will benefit from Home Health PT upon discharge to continue regaining strength and mobility for return to previous level of function and decrease fall risk. Pt. Would benefit from continued skilled physical therapy to increase mobility and decrease fall risk while in the hospital.     Follow Up Recommendations Home health PT    Equipment Recommendations  Cane    Recommendations for Other Services       Precautions / Restrictions Precautions Precautions: Fall Precaution Comments: history of falling Restrictions Weight Bearing Restrictions: No      Mobility  Bed Mobility Overal bed mobility: Independent             General bed mobility comments: Independent rolling R and L, Supine <> Sit EOB, require UE assistance   Transfers Overall transfer level: Modified independent                General transfer comment: Requires additional time for STS and use of UE   Ambulation/Gait Ambulation/Gait assistance: Min guard;Min assist Ambulation Distance (Feet): 80 Feet Assistive device: 1 person hand held assist Gait Pattern/deviations: Step-through pattern;Decreased stride length;Trunk flexed;Wide base of support   Gait velocity interpretation: Below normal speed for age/gender General Gait Details: Patient has wide BOS and increases trunk flexion with fatigue. Occasional 1 HHA required to steady patient either on PT or on wall for support.   Stairs            Wheelchair Mobility    Modified Rankin (Stroke Patients Only)       Balance Overall balance assessment: Modified Independent;Needs assistance   Sitting balance-Leahy Scale: Normal Sitting balance - Comments: Reaches across body   Standing balance support: Single extremity supported;During functional activity Standing balance-Leahy Scale: Fair Standing balance comment: Patient "furniture surfaces" occasionally requiring SUE support on wall to steady self when walking.                              Pertinent Vitals/Pain Pain Assessment: No/denies pain    Home Living Family/patient expects to be discharged to:: Private residence Living Arrangements: Alone Available Help at Discharge: Family(son lives a few minutes away and will be there frequently to help) Type of Home: House       Home Layout: Able to live on main level with bedroom/bathroom Home Equipment: Gilmer MorCane - single point;Walker -  standard Additional Comments: Pt was fully independent prior to hospitalization. Drove herself, cooked for self, ambualted without AD    Prior Function Level of Independence: Independent         Comments: Patient PLOF fully independent      Hand Dominance   Dominant Hand: Right    Extremity/Trunk Assessment   Upper Extremity Assessment Upper Extremity Assessment: Overall WFL for tasks  assessed    Lower Extremity Assessment Lower Extremity Assessment: Overall WFL for tasks assessed;Generalized weakness(R and LLE 4/5 )    Cervical / Trunk Assessment Cervical / Trunk Assessment: (slight forward trunk lean due to back PMH. )  Communication   Communication: No difficulties  Cognition Arousal/Alertness: Awake/alert Behavior During Therapy: WFL for tasks assessed/performed Overall Cognitive Status: Within Functional Limits for tasks assessed                                 General Comments: Oriented x4, followed complex commands      General Comments General comments (skin integrity, edema, etc.): L ankle edema noted but patient reports that that is her baseline.     Exercises General Exercises - Lower Extremity Ankle Circles/Pumps: Strengthening;AROM;Both;20 reps;Supine Gluteal Sets: Strengthening;Both;Supine;20 reps Long Arc Quad: AROM;Strengthening;Both;10 reps;Seated Heel Slides: AROM;Strengthening;Both;10 reps;Supine Hip Flexion/Marching: Strengthening;Both;10 reps;Seated Other Exercises Other Exercises: Standing without UE support with postural cueing for upright posture Other Exercises: Postural education to decrease falls risk Other Exercises: ambulate without AD and CGA, occasional "wall surfing" to steady self.  Other Exercises: 5xSTS=26 seconds with UE support   Assessment/Plan    PT Assessment Patient needs continued PT services  PT Problem List Decreased strength;Decreased activity tolerance;Decreased knowledge of use of DME;Decreased mobility       PT Treatment Interventions DME instruction;Gait training;Stair training;Functional mobility training;Neuromuscular re-education;Balance training;Therapeutic exercise;Therapeutic activities;Patient/family education    PT Goals (Current goals can be found in the Care Plan section)  Acute Rehab PT Goals Patient Stated Goal: to return home to PLOF PT Goal Formulation: With  patient/family Time For Goal Achievement: 10/04/17 Potential to Achieve Goals: Fair    Frequency Min 2X/week   Barriers to discharge Decreased caregiver support pt. lives at home alone, would benefit from home health or aide    Co-evaluation               AM-PAC PT "6 Clicks" Daily Activity  Outcome Measure Difficulty turning over in bed (including adjusting bedclothes, sheets and blankets)?: None Difficulty moving from lying on back to sitting on the side of the bed? : A Little Difficulty sitting down on and standing up from a chair with arms (e.g., wheelchair, bedside commode, etc,.)?: A Little Help needed moving to and from a bed to chair (including a wheelchair)?: A Little Help needed walking in hospital room?: A Little Help needed climbing 3-5 steps with a railing? : A Lot 6 Click Score: 18    End of Session Equipment Utilized During Treatment: Gait belt Activity Tolerance: Patient tolerated treatment well Patient left: in bed;with family/visitor present Nurse Communication: Mobility status;Other (comment)(PT done so external catheter to be placed back in) PT Visit Diagnosis: Unsteadiness on feet (R26.81);Other abnormalities of gait and mobility (R26.89);Muscle weakness (generalized) (M62.81)    Time: 1326-1401 PT Time Calculation (min) (ACUTE ONLY): 35 min   Charges:   PT Evaluation $PT Eval Low Complexity: 1 Low PT Treatments $Therapeutic Exercise: 8-22 mins   PT G Codes:   PT G-Codes **  NOT FOR INPATIENT CLASS** Functional Assessment Tool Used: AM-PAC 6 Clicks Basic Mobility;Clinical judgement;Other (comment)(5x STS) Functional Limitation: Mobility: Walking and moving around Mobility: Walking and Moving Around Current Status [phone](G8978): At least 40 percent but less than 60 percent impaired, limited or restricted Mobility: Walking and Moving Around Goal Status [phone](G8979): At least 1 percent but less than 20 percent impaired, limited or restricted    Precious BardMarina Ivory Maduro,  PT, DPT    Precious BardMarina Gorden Stthomas 09/20/2017, 2:20 PM

## 2017-09-20 NOTE — Plan of Care (Signed)
Patient resting in the bed at this time, family at bedside, denies any pain, vss, PT/STs consult.

## 2017-09-21 LAB — LEGIONELLA PNEUMOPHILA SEROGP 1 UR AG: L. PNEUMOPHILA SEROGP 1 UR AG: NEGATIVE

## 2017-09-21 LAB — URINE CULTURE

## 2017-09-21 LAB — HIV ANTIBODY (ROUTINE TESTING W REFLEX): HIV Screen 4th Generation wRfx: NONREACTIVE

## 2017-09-21 LAB — GLUCOSE, CAPILLARY
Glucose-Capillary: 94 mg/dL (ref 65–99)
Glucose-Capillary: 98 mg/dL (ref 65–99)

## 2017-09-21 MED ORDER — LEVOFLOXACIN 500 MG PO TABS: 500.0000 mg | ORAL_TABLET | ORAL | 0 refills | Status: AC

## 2017-09-21 MED ORDER — LEVOFLOXACIN 750 MG PO TABS: 750.0000 mg | ORAL_TABLET | Freq: Every day | ORAL | 0 refills | Status: DC

## 2017-09-21 NOTE — Evaluation (Addendum)
Clinical/Bedside Swallow Evaluation Patient Details  Name: [name] MRN: [id number] Date of Birth: 11/03/1930  Today's Date: 09/21/2017 Time: SLP Start Time (ACUTE ONLY): 0820 SLP Stop Time (ACUTE ONLY): 0920 SLP Time Calculation (min) (ACUTE ONLY): 60 min  Past Medical History:  Past Medical History:  Diagnosis Date  . Cervical disc disease   . Chronic kidney disease    stage 3  . COPD (chronic obstructive pulmonary disease) (HCC)   . Cystitis   . Degenerative joint disease   . Diabetes mellitus without complication (HCC)    DM type 2  . Diabetic retinopathy associated with type 2 diabetes mellitus, without macular edema   . Diverticulosis of colon    without mention of hemorrhage  . Gastric ulcer   . GERD (gastroesophageal reflux disease)   . Hyperlipemia   . Hypertension   . Mitral valve prolapse   . Premature atrial contraction   . Sick sinus syndrome (HCC)   . Sinoatrial node dysfunction (HCC)    Past Surgical History:  Past Surgical History:  Procedure Laterality Date  . ABDOMINAL HYSTERECTOMY    . BACK SURGERY     lumbar back surgery  . COLONOSCOPY    . ESOPHAGOGASTRODUODENOSCOPY    . gastric ulcer surgery    . KIDNEY SURGERY    . pacemaker     insertion dual chamber pacemaker generator  . PARATHYROIDECTOMY    . THYROIDECTOMY, PARTIAL     lobectomy partial thyroid   HPI:  Patient is a pleasant 83 year old female who presents to hospital from a fall. Pt's family found her in an episode of emesis on the floor. Her temperature when taken in the ER was 101.9 with 02 in the 80's. She was found to have pneumonia. Pt does have PMH of GERD, COPD.  Assessment / Plan / Recommendation Clinical Impression  Pt appears to present w/ adequate oropharyngeal swallow function. Pt has past med hx of GERD, and COPD w/ present pna. Any respiratory and/or GERD issues can impact swallow function w/ timing of swallow and increase aspiration and choking risk from any  potential regurgitation of Reflux material. Any respiratory issues can increase risk for aspiration.  Pt was assessed during breakfast meal of thin liquids via cup and soft solids. During trials of thin liquids via Cup, pt's oral phase appeared grossly [facility]/b timely A-P transit and swallow w/ adequate oral clearing. No overt s/s of aspiration were noted; no coughing or wet vocal quality. Pt did exhibit mild throat clear 1x during trials of thin liquids, but pt was attempting to talk after drinking. The throat clearing did not increase in freqeuncy or intensity during the remainder of the evaluation. No decline in vocal quality or respiratory status was noted throughout the meal. During trials of soft solids, pt's oral phase appeared grossly [facility] c/b rotary mastication pattern, timely A-P transit and swallow w/ adequate oral clearing. No overt, immediate s/s of aspiration were noted. No decline in vocal quality or respiratory status was noted. During the evaluation NSG gave pt 13 pills simultaneously w/ thin liquids, and pt did not exhibit any overt, immediate s/s of aspiration. Pt was independent w/ feeding. Pt reported she did not have any concerns about her swallowing.Pt was educated on general aspiration precautions - sitting upright, small bites/sips, slow pace, clear mouth before taking another bite, reducing environmental distractions and GERD precautions - slow pace, sitting upright for 30-60 min after meal and monitoring her overall respirations to avoid any  SOB/WOB while eating.   D/t pt's overall presentation, recommend age-appropriate regular diet w/ thin liquids; general aspiration precautions - sitting upright, small bites/sips, slow pace, clear mouth before taking another bite, reducing environmental distractions. Recommend GERD precautions d/t diagnosis (per chart) - sitting upright for 30-60 min after meal, slow pace. Recommend meds whole w/ thin liquids; pacing during meals.  No further skilled  ST services are indicated at this time, however, NSG will reconsult if decline in status occurs. NSG/MD updated.       Aspiration Risk  Reduced risk for aspiration when following general aspiration precautions; Reflux precautions    Diet Recommendation   Age appropriate regular diet w/ thin liquids; general aspiration precautions.   Medication Administration: Whole meds with liquid but educated on swallowing fewer pills at one time   Other  Recommendations Recommended Consults: (Dietican f/u) Oral Care Recommendations: Oral care BID;Patient independent with oral care   Follow up Recommendations None      Frequency and Duration  n/a         Prognosis Prognosis for Safe Diet Advancement: Good      Swallow Study   General Date of Onset: 09/19/17 HPI: Patient is a pleasant 83 year old female who presents to hospital from a fall. Pt's family found her in an episode of emesis on the floor. Her temperature when taken in the ER was 101.9 with 02 in the 80's. She was found to have pneumonia.  Type of Study: Bedside Swallow Evaluation Previous Swallow Assessment: none reported Diet Prior to this Study: Regular;Thin liquids Temperature Spikes Noted: No(wbc WNL) Respiratory Status: Room air History of Recent Intubation: No Behavior/Cognition: Alert;Cooperative;Pleasant mood Oral Cavity Assessment: Within Functional Limits Oral Care Completed by SLP: Recent completion by staff Oral Cavity - Dentition: Adequate natural dentition Vision: Functional for self-feeding Self-Feeding Abilities: Able to feed self Patient Positioning: Upright in bed Baseline Vocal Quality: Low vocal intensity;Normal Volitional Cough: Strong Volitional Swallow: Able to elicit    Oral/Motor/Sensory Function Overall Oral Motor/Sensory Function: Within functional limits   Ice Chips Ice chips: Not tested   Thin Liquid Thin Liquid: Within functional limits(4 trials) Presentation: Cup;Self Fed    Nectar Thick  Nectar Thick Liquid: Not tested   Honey Thick Honey Thick Liquid: Not tested   Puree Puree: Not tested   Solid   GO   Solid: Within functional limits(10 trials - scrambled egg and pancake) Presentation: Self Fed        Nancy Fetterarson Barbee, SLP-Graduate Student Nancy Fetterarson Barbee 09/21/2017,9:31 AM   The information in this patient note, response to treatment, and overall treatment plan developed has been reviewed and agreed upon by this clinician.  Jerilynn SomKatherine Ezrie Bunyan, MS, CCC-SLP [phone] 09/21/17,11:12 AM

## 2017-09-21 NOTE — Care Management Note (Signed)
Case Management Note  Patient Details  Name: [name] Ashkar MRN: [id number] Date of Birth: 04/29/1931  Subjective/Objective:   Admitted [facility] with the diagnosis of pneumonia. Lives alone. Son is Dorene SorrowJerry [phone]). Next appointment with Dr. Bethann PunchesMark Miller is scheduled for January 2019. Prescriptions are [facility] at [facility] [place]. No home Health. No skilled facility. No home oxygen. Uses no medical equipment. Takes care of all basic and instrumental activities of daily living herself, drives. Fell last Friday. Fair appetite. Son will transport                 Action/Plan: Physical therapy evaluation completed. Recommending home with Home Health and Physical therapy. Discussed Home Health agencies. Chose [facility].   Expected Discharge Date:                  Expected Discharge Plan:     In-House Referral:     Discharge planning Services   yes  Post Acute Care Choice:   yes Choice offered to:   Ms. Laureen Ochsurrie  DME Arranged:    DME Agency:     HH Arranged:   yes HH Agency:   [facility]  Status of Service:     If discussed at Long Length of Stay Meetings, dates discussed:    Additional Comments:  Gwenette GreetBrenda S Isamar Nazir, RN MSN CCM Care Management [phone] 09/21/2017, 9:55 AM

## 2017-09-21 NOTE — Discharge Summary (Signed)
[employer] [facility] [place] [facility]   PATIENT NAME: [name]    MR#:  [id number]  DATE OF BIRTH:  [birth year]  DATE OF ADMISSION:  09/19/2017 ADMITTING PHYSICIAN: Bertrum Sol, MD  DATE OF DISCHARGE: 09/21/2017  PRIMARY CARE PHYSICIAN: [name], MD    ADMISSION DIAGNOSIS:  Delirium [R41.0] Sepsis, due to unspecified organism [facility]) [A41.9] Community acquired pneumonia of right upper lobe of lung (HCC) [J18.1]  DISCHARGE DIAGNOSIS:  Active Problems:   CAP (community acquired pneumonia)   SECONDARY DIAGNOSIS:   Past Medical History:  Diagnosis Date  . Cervical disc disease   . Chronic kidney disease    stage 3  . COPD (chronic obstructive pulmonary disease) (HCC)   . Cystitis   . Degenerative joint disease   . Diabetes mellitus without complication (HCC)    DM type 2  . Diabetic retinopathy associated with type 2 diabetes mellitus, without macular edema   . Diverticulosis of colon    without mention of hemorrhage  . Gastric ulcer   . GERD (gastroesophageal reflux disease)   . Hyperlipemia   . Hypertension   . Mitral valve prolapse   . Premature atrial contraction   . Sick sinus syndrome (HCC)   . Sinoatrial node dysfunction [facility])     HOSPITAL COURSE:  83 year old female with a history of diabetes and essential hypertension who presented to the emergency room after a fall and found to have a fever and hypoxia.  1. Acute hypoxic respiratory failure in the setting of a community acquired pneumonia She is off of oxygen.  2. Community acquired pneumonia: She was on Rocephin and azithromycin and will be discharged on oral Levaquin. Blood cultures were negative.  3. Hypokalemia: This has been repleted.  4. Diabetes: Continue glipizide, Actos with ADA diet.  5. Essential hypertension: Continue losartan, isosorbide and Coreg  Physical therapy has recommended home with home health at discharge.     DISCHARGE CONDITIONS AND  DIET:   Stable for discharge on diabetic diet  CONSULTS OBTAINED:    DRUG ALLERGIES:   Allergies  Allergen Reactions  . Darvon [Propoxyphene] Nausea And Vomiting  . Hydrocodone Nausea And Vomiting  . Janumet [Sitagliptin-Metformin Hcl] Nausea And Vomiting  . Motrin [Ibuprofen] Nausea And Vomiting  . Tramadol Nausea And Vomiting    Can tolerate this medication in small doses (takes half a pill).    DISCHARGE MEDICATIONS:   Current Discharge Medication List    START taking these medications   Details  levofloxacin (LEVAQUIN) 500 MG tablet Take 1 tablet (500 mg total) by mouth every other day for 5 days. Qty: 3 tablet, Refills: 0      CONTINUE these medications which have NOT CHANGED   Details  acetaminophen (TYLENOL) 500 MG tablet Take 1,000 mg by mouth every 8 (eight) hours as needed.    aspirin EC 81 MG tablet Take 81 mg by mouth daily.    atorvastatin (LIPITOR) 20 MG tablet Take 20 mg by mouth daily.    budesonide-formoterol (SYMBICORT) 160-4.5 MCG/ACT inhaler Inhale 1 puff into the lungs 2 (two) times daily as needed.    carboxymethylcellulose (REFRESH TEARS) 0.5 % SOLN Apply to eye.    carvedilol (COREG) 3.125 MG tablet Take 6.25 mg by mouth 2 (two) times daily with a meal.    furosemide (LASIX) 20 MG tablet Take 20 mg by mouth daily.    gabapentin (NEURONTIN) 300 MG capsule Take 1 capsule (300 mg total) by mouth 2 (two)  times daily. Qty: 60 capsule, Refills: 2    glipiZIDE (GLUCOTROL XL) 5 MG 24 hr tablet Take 10 mg by mouth 2 (two) times daily.     glucose blood (PRECISION QID TEST) test strip Use 2 (two) times daily. True metrix meter. Diagnosis: E11.29    isosorbide mononitrate (IMDUR) 60 MG 24 hr tablet Take 60 mg by mouth daily.     losartan (COZAAR) 100 MG tablet Take 100 mg by mouth daily.     meloxicam (MOBIC) 7.5 MG tablet Take 1 tablet by mouth daily.    pioglitazone (ACTOS) 30 MG tablet Take 30 mg by mouth daily.    vitamin B-12  (CYANOCOBALAMIN) 1000 MCG tablet Take 1,000 mcg by mouth daily.          Today   CHIEF COMPLAINT:   No acute issues overnight. Patient is feeling well this point. No shortness of breath.   VITAL SIGNS:  Blood pressure 140/63, pulse 61, temperature 98.5 F (36.9 C), temperature source Oral, resp. rate 20, height 5\' 4"  (1.626 m), weight 63.5 kg (140 lb), SpO2 92 %.   REVIEW OF SYSTEMS:  Review of Systems  Constitutional: Negative.  Negative for chills, fever and malaise/fatigue.  HENT: Negative.  Negative for ear discharge, ear pain, hearing loss, nosebleeds and sore throat.   Eyes: Negative.  Negative for blurred vision and pain.  Respiratory: Negative.  Negative for cough, hemoptysis, shortness of breath and wheezing.   Cardiovascular: Negative.  Negative for chest pain, palpitations and leg swelling.  Gastrointestinal: Negative.  Negative for abdominal pain, blood in stool, diarrhea, nausea and vomiting.  Genitourinary: Negative.  Negative for dysuria.  Musculoskeletal: Negative.  Negative for back pain.  Skin: Negative.   Neurological: Negative for dizziness, tremors, speech change, focal weakness, seizures and headaches.  Endo/Heme/Allergies: Negative.  Does not bruise/bleed easily.  Psychiatric/Behavioral: Negative.  Negative for depression, hallucinations and suicidal ideas.     PHYSICAL EXAMINATION:  GENERAL:  83 y.o.-year-old patient lying in the bed with no acute distress.  NECK:  Supple, no jugular venous distention. No thyroid enlargement, no tenderness.  LUNGS: Normal breath sounds bilaterally, no wheezing, rales,rhonchi  No use of accessory muscles of respiration.  CARDIOVASCULAR: S1, S2 normal. No murmurs, rubs, or gallops.  ABDOMEN: Soft, non-tender, non-distended. Bowel sounds present. No organomegaly or mass.  EXTREMITIES: No pedal edema, cyanosis, or clubbing.  PSYCHIATRIC: The patient is alert and oriented x 3.  SKIN: No obvious rash, lesion, or ulcer.    DATA REVIEW:   CBC Recent Labs  Lab 09/19/17 2012  WBC 6.7  HGB 13.1  HCT 40.1  PLT 137*    Chemistries  Recent Labs  Lab 09/19/17 2012 09/20/17 0005  NA 139 137  K 3.2* 3.7  CL 101 102  CO2 28 25  GLUCOSE 104* 225*  BUN 23* 23*  CREATININE 1.61* 1.53*  CALCIUM 9.6 8.6*  MG  --  1.7  AST 25  --   ALT 27  --   ALKPHOS 60  --   BILITOT 1.0  --     Cardiac Enzymes Recent Labs  Lab 09/19/17 2012  TROPONINI 0.04*    Microbiology Results  @[email or web address] @  RADIOLOGY:  Ct Head Wo Contrast  Result Date: 09/19/2017 CLINICAL DATA:  Unwitnessed fall today. Found down. Unexplained altered level consciousness. EXAM: CT HEAD WITHOUT CONTRAST TECHNIQUE: Contiguous axial images were obtained from the base of the skull through the vertex without intravenous contrast. COMPARISON:  None. FINDINGS: Brain: No evidence  of acute infarction, hemorrhage, hydrocephalus, extra-axial collection, or mass lesion/mass effect. Mild diffuse cerebral atrophy and chronic small vessel disease. Vascular:  No hyperdense vessel or other acute findings. Skull: No evidence of fracture or other significant bone abnormality. Sinuses/Orbits: No acute findings. Mild chronic mucosal thickening involving bilateral ethmoid sinuses. Other: None. IMPRESSION: No acute intracranial abnormality. Mild cerebral atrophy and chronic small vessel disease. Electronically Signed   By: Myles RosenthalJohn  Stahl M.D.   On: 09/19/2017 19:32   Dg Chest Port 1 View  Result Date: 09/19/2017 CLINICAL DATA:  Code sepsis.  Weakness and confusion. EXAM: PORTABLE CHEST 1 VIEW COMPARISON:  03/05/2016 FINDINGS: Stable cardiomegaly with aortic atherosclerosis. Left-sided pacemaker apparatus with right atrial and right ventricular leads. Surgical clips are seen at the GE juncture. Confluent opacity in the right upper lobe suspicious for pneumonia. No overt pulmonary edema. No effusion. No acute osseous abnormality. IMPRESSION: 1. Pneumonic  consolidation in the right upper lobe. 2. Stable cardiomegaly with aortic atherosclerosis. Electronically Signed   By: Tollie Ethavid  Kwon M.D.   On: 09/19/2017 19:16      Current Discharge Medication List    START taking these medications   Details  levofloxacin (LEVAQUIN) 500 MG tablet Take 1 tablet (500 mg total) by mouth every other day for 5 days. Qty: 3 tablet, Refills: 0      CONTINUE these medications which have NOT CHANGED   Details  acetaminophen (TYLENOL) 500 MG tablet Take 1,000 mg by mouth every 8 (eight) hours as needed.    aspirin EC 81 MG tablet Take 81 mg by mouth daily.    atorvastatin (LIPITOR) 20 MG tablet Take 20 mg by mouth daily.    budesonide-formoterol (SYMBICORT) 160-4.5 MCG/ACT inhaler Inhale 1 puff into the lungs 2 (two) times daily as needed.    carboxymethylcellulose (REFRESH TEARS) 0.5 % SOLN Apply to eye.    carvedilol (COREG) 3.125 MG tablet Take 6.25 mg by mouth 2 (two) times daily with a meal.    furosemide (LASIX) 20 MG tablet Take 20 mg by mouth daily.    gabapentin (NEURONTIN) 300 MG capsule Take 1 capsule (300 mg total) by mouth 2 (two) times daily. Qty: 60 capsule, Refills: 2    glipiZIDE (GLUCOTROL XL) 5 MG 24 hr tablet Take 10 mg by mouth 2 (two) times daily.     glucose blood (PRECISION QID TEST) test strip Use 2 (two) times daily. True metrix meter. Diagnosis: E11.29    isosorbide mononitrate (IMDUR) 60 MG 24 hr tablet Take 60 mg by mouth daily.     losartan (COZAAR) 100 MG tablet Take 100 mg by mouth daily.     meloxicam (MOBIC) 7.5 MG tablet Take 1 tablet by mouth daily.    pioglitazone (ACTOS) 30 MG tablet Take 30 mg by mouth daily.    vitamin B-12 (CYANOCOBALAMIN) 1000 MCG tablet Take 1,000 mcg by mouth daily.           Management plans discussed with the patient and she is in agreement. Stable for discharge home  Patient should follow up with pcp  CODE STATUS:     Code Status Orders  (From admission, onward)         Start     Ordered   09/19/17 2259  Full code  Continuous     09/19/17 2258    Code Status History    Date Active Date Inactive Code Status Order ID Comments User Context   This patient has a current code status but no  historical code status.      TOTAL TIME TAKING CARE OF THIS PATIENT: 38 minutes.    Note: This dictation was prepared with Dragon dictation along with smaller phrase technology. Any transcriptional errors that result from this process are unintentional.  Kieon Lawhorn M.D on 09/21/2017 at 10:31 AM  Between 7am to 6pm - Pager - [phone] After 6pm go to [email or web address] - Social research officer, governmentpassword [email or web address]  [facility] [place] [facility]  Office  [phone]  CC: Primary care physician; Danella PentonMiller, Mark F, MD

## 2017-09-21 NOTE — Plan of Care (Signed)
  Progressing Education: Knowledge of General Education information will improve 09/21/2017 0614 - Progressing by Peterson LombardKlenner, Ghina Bittinger C, RN Health Behavior/Discharge Planning: Ability to manage health-related needs will improve 09/21/2017 0614 - Progressing by Peterson LombardKlenner, Billey Wojciak C, RN Clinical Measurements: Ability to maintain clinical measurements within normal limits will improve 09/21/2017 0614 - Progressing by Peterson LombardKlenner, Tyah Acord C, RN Will remain free from infection 09/21/2017 [id number] - Progressing by Peterson LombardKlenner, Lachlan Mckim C, RN Diagnostic test results will improve 09/21/2017 [phone] - Progressing by Peterson LombardKlenner, Jenella Craigie C, RN Respiratory complications will improve 09/21/2017 [id number] - Progressing by Peterson LombardKlenner, Jaymari Cromie C, RN Cardiovascular complication will be avoided 09/21/2017 [id number] - Progressing by Peterson LombardKlenner, Quida Glasser C, RN Activity: Risk for activity intolerance will decrease 09/21/2017 0614 - Progressing by Peterson LombardKlenner, Mitsuye Schrodt C, RN Nutrition: Adequate nutrition will be maintained 09/21/2017 [id number] - Progressing by Peterson LombardKlenner, Quinlan Mcfall C, RN Coping: Level of anxiety will decrease 09/21/2017 0614 - Progressing by Peterson LombardKlenner, Nadira Single C, RN Elimination: Will not experience complications related to bowel motility 09/21/2017 0614 - Progressing by Peterson LombardKlenner, Amyia Lodwick C, RN Will not experience complications related to urinary retention 09/21/2017 [id number] - Progressing by Peterson LombardKlenner, Mahagony Grieb C, RN Pain Managment: General experience of comfort will improve 09/21/2017 0614 - Progressing by Peterson LombardKlenner, Nolie Bignell C, RN Safety: Ability to remain free from injury will improve 09/21/2017 0614 - Progressing by Peterson LombardKlenner, Tava Peery C, RN Skin Integrity: Risk for impaired skin integrity will decrease 09/21/2017 0614 - Progressing by Peterson LombardKlenner, Ilianna Bown C, RN Activity: Ability to tolerate increased activity will improve 09/21/2017 0614 - Progressing by Peterson LombardKlenner, Andreea Arca C, RN Clinical Measurements: Ability to maintain a body temperature in the normal range will  improve 09/21/2017 0614 - Progressing by Peterson LombardKlenner, Joaquim Tolen C, RN Respiratory: Ability to maintain adequate ventilation will improve 09/21/2017 0614 - Progressing by Peterson LombardKlenner, Hakiem Malizia C, RN

## 2017-09-21 NOTE — Progress Notes (Signed)
Pt being d/ced home w/home health. Admitted for community acquired PNA and will be on PO abx.  Will have Home PT and RN and COPD program. No SOB.  She has been afebrile.  Ambulates with standby asst to BR. Weaned off of O2.   Strep PNA neg.  No c/o and is ready to go home.  IV removed by nurse tech and son transporting her home after d/c instructions reviewed.

## 2017-09-21 NOTE — Care Management Important Message (Signed)
Important Message  Patient Details  Name: [name] MRN: [id number] Date of Birth: 09/29/1931   Medicare Important Message Given:  Yes    Gwenette GreetBrenda S Trevaris Pennella, RN 09/21/2017, 8:26 AM

## 2017-09-24 LAB — CULTURE, BLOOD (ROUTINE X 2)
Culture: NO GROWTH
Culture: NO GROWTH
SPECIAL REQUESTS: ADEQUATE
Special Requests: ADEQUATE

## 2017-09-25 LAB — CULTURE, BLOOD (ROUTINE X 2)
CULTURE: NO GROWTH
Culture: NO GROWTH
SPECIAL REQUESTS: ADEQUATE

## 2017-10-13 ENCOUNTER — Other Ambulatory Visit: Payer: Self-pay

## 2017-10-13 ENCOUNTER — Encounter: Payer: Self-pay | Admitting: *Deleted

## 2017-10-13 ENCOUNTER — Emergency Department: Payer: Medicare Other

## 2017-10-13 ENCOUNTER — Inpatient Hospital Stay
Admission: EM | Admit: 2017-10-13 | Discharge: 2017-10-15 | DRG: 683 | Disposition: A | Discharge status: Home Health | Payer: Medicare Other | Attending: Internal Medicine | Admitting: Internal Medicine

## 2017-10-13 DIAGNOSIS — Z950 Presence of cardiac pacemaker: Secondary | ICD-10-CM | POA: Diagnosis not present

## 2017-10-13 DIAGNOSIS — Z803 Family history of malignant neoplasm of breast: Secondary | ICD-10-CM | POA: Diagnosis not present

## 2017-10-13 DIAGNOSIS — E1122 Type 2 diabetes mellitus with diabetic chronic kidney disease: Secondary | ICD-10-CM | POA: Diagnosis present

## 2017-10-13 DIAGNOSIS — R112 Nausea with vomiting, unspecified: Secondary | ICD-10-CM | POA: Diagnosis present

## 2017-10-13 DIAGNOSIS — Z7984 Long term (current) use of oral hypoglycemic drugs: Secondary | ICD-10-CM

## 2017-10-13 DIAGNOSIS — Z791 Long term (current) use of non-steroidal anti-inflammatories (NSAID): Secondary | ICD-10-CM

## 2017-10-13 DIAGNOSIS — E860 Dehydration: Secondary | ICD-10-CM | POA: Diagnosis present

## 2017-10-13 DIAGNOSIS — Z7982 Long term (current) use of aspirin: Secondary | ICD-10-CM | POA: Diagnosis not present

## 2017-10-13 DIAGNOSIS — E785 Hyperlipidemia, unspecified: Secondary | ICD-10-CM | POA: Diagnosis present

## 2017-10-13 DIAGNOSIS — K219 Gastro-esophageal reflux disease without esophagitis: Secondary | ICD-10-CM | POA: Diagnosis present

## 2017-10-13 DIAGNOSIS — J449 Chronic obstructive pulmonary disease, unspecified: Secondary | ICD-10-CM | POA: Diagnosis present

## 2017-10-13 DIAGNOSIS — N189 Chronic kidney disease, unspecified: Secondary | ICD-10-CM

## 2017-10-13 DIAGNOSIS — I341 Nonrheumatic mitral (valve) prolapse: Secondary | ICD-10-CM | POA: Diagnosis present

## 2017-10-13 DIAGNOSIS — E11319 Type 2 diabetes mellitus with unspecified diabetic retinopathy without macular edema: Secondary | ICD-10-CM | POA: Diagnosis present

## 2017-10-13 DIAGNOSIS — Z7951 Long term (current) use of inhaled steroids: Secondary | ICD-10-CM | POA: Diagnosis not present

## 2017-10-13 DIAGNOSIS — N3000 Acute cystitis without hematuria: Secondary | ICD-10-CM | POA: Diagnosis present

## 2017-10-13 DIAGNOSIS — N183 Chronic kidney disease, stage 3 (moderate): Secondary | ICD-10-CM | POA: Diagnosis present

## 2017-10-13 DIAGNOSIS — N179 Acute kidney failure, unspecified: Principal | ICD-10-CM | POA: Diagnosis present

## 2017-10-13 DIAGNOSIS — N3001 Acute cystitis with hematuria: Secondary | ICD-10-CM | POA: Diagnosis present

## 2017-10-13 DIAGNOSIS — I129 Hypertensive chronic kidney disease with stage 1 through stage 4 chronic kidney disease, or unspecified chronic kidney disease: Secondary | ICD-10-CM | POA: Diagnosis present

## 2017-10-13 LAB — COMPREHENSIVE METABOLIC PANEL
ALT: 26 U/L (ref 14–54)
AST: 26 U/L (ref 15–41)
Albumin: 3.9 g/dL (ref 3.5–5.0)
Alkaline Phosphatase: 68 U/L (ref 38–126)
Anion gap: 12 (ref 5–15)
BUN: 50 mg/dL — AB (ref 6–20)
CHLORIDE: 97 mmol/L — AB (ref 101–111)
CO2: 27 mmol/L (ref 22–32)
CREATININE: 2.67 mg/dL — AB (ref 0.44–1.00)
Calcium: 10 mg/dL (ref 8.9–10.3)
GFR calc Af Amer: 18 mL/min — ABNORMAL LOW (ref >60)
GFR calc non Af Amer: 15 mL/min — ABNORMAL LOW (ref >60)
Glucose, Bld: 205 mg/dL — ABNORMAL HIGH (ref 65–99)
Potassium: 3.9 mmol/L (ref 3.5–5.1)
SODIUM: 136 mmol/L (ref 135–145)
Total Bilirubin: 0.6 mg/dL (ref 0.3–1.2)
Total Protein: 7.1 g/dL (ref 6.5–8.1)

## 2017-10-13 LAB — URINALYSIS, COMPLETE (UACMP) WITH MICROSCOPIC
Bilirubin Urine: NEGATIVE
Glucose, UA: NEGATIVE mg/dL
Hgb urine dipstick: NEGATIVE
Ketones, ur: NEGATIVE mg/dL
Nitrite: NEGATIVE
PH: 5 (ref 5.0–8.0)
PROTEIN: NEGATIVE mg/dL
SPECIFIC GRAVITY, URINE: 1.013 (ref 1.005–1.030)

## 2017-10-13 LAB — CBC WITH DIFFERENTIAL/PLATELET
Basophils Absolute: 0 10*3/uL (ref 0–0.1)
Basophils Relative: 1 %
Eosinophils Absolute: 0.1 10*3/uL (ref 0–0.7)
Eosinophils Relative: 2 %
HEMATOCRIT: 40.7 % (ref 35.0–47.0)
HEMOGLOBIN: 13.4 g/dL (ref 12.0–16.0)
LYMPHS ABS: 0.9 10*3/uL — AB (ref 1.0–3.6)
Lymphocytes Relative: 15 %
MCH: 28.3 pg (ref 26.0–34.0)
MCHC: 32.9 g/dL (ref 32.0–36.0)
MCV: 86.1 fL (ref 80.0–100.0)
MONOS PCT: 9 %
Monocytes Absolute: 0.6 10*3/uL (ref 0.2–0.9)
NEUTROS ABS: 4.4 10*3/uL (ref 1.4–6.5)
NEUTROS PCT: 73 %
Platelets: 200 10*3/uL (ref 150–440)
RBC: 4.73 MIL/uL (ref 3.80–5.20)
RDW: 15.1 % — ABNORMAL HIGH (ref 11.5–14.5)
WBC: 6 10*3/uL (ref 3.6–11.0)

## 2017-10-13 LAB — TROPONIN I: Troponin I: <0.03 ng/mL (ref <0.03)

## 2017-10-13 MED ORDER — CARVEDILOL 6.25 MG PO TABS
6.2500 mg | ORAL_TABLET | Freq: Two times a day (BID) | ORAL | Status: DC
  Administered 2017-10-14 – 2017-10-15 (×2): 6.25 mg via ORAL
  Filled 2017-10-13 (×3): qty 1

## 2017-10-13 MED ORDER — DEXTROSE 5 % IV SOLN
1.0000 g | INTRAVENOUS | Status: DC
  Administered 2017-10-14 – 2017-10-15 (×2): 1 g via INTRAVENOUS
  Filled 2017-10-13 (×2): qty 10

## 2017-10-13 MED ORDER — GABAPENTIN 300 MG PO CAPS
300.0000 mg | ORAL_CAPSULE | Freq: Two times a day (BID) | ORAL | Status: DC
  Administered 2017-10-14 – 2017-10-15 (×4): 300 mg via ORAL
  Filled 2017-10-13 (×4): qty 1

## 2017-10-13 MED ORDER — ONDANSETRON HCL 4 MG PO TABS
4.0000 mg | ORAL_TABLET | Freq: Four times a day (QID) | ORAL | Status: DC | PRN
  Administered 2017-10-14: 4 mg via ORAL
  Filled 2017-10-13: qty 1

## 2017-10-13 MED ORDER — SODIUM CHLORIDE 0.9 % IV SOLN
INTRAVENOUS | Status: AC
  Administered 2017-10-14 (×2): via INTRAVENOUS

## 2017-10-13 MED ORDER — ISOSORBIDE MONONITRATE ER 30 MG PO TB24
60.0000 mg | ORAL_TABLET | Freq: Every day | ORAL | Status: DC
  Administered 2017-10-14 – 2017-10-15 (×2): 60 mg via ORAL
  Filled 2017-10-13 (×2): qty 2

## 2017-10-13 MED ORDER — VITAMIN B-12 1000 MCG PO TABS
1000.0000 ug | ORAL_TABLET | Freq: Every day | ORAL | Status: DC
  Administered 2017-10-14 – 2017-10-15 (×2): 1000 ug via ORAL
  Filled 2017-10-13 (×2): qty 1

## 2017-10-13 MED ORDER — ACETAMINOPHEN 500 MG PO TABS
1000.0000 mg | ORAL_TABLET | Freq: Three times a day (TID) | ORAL | Status: DC | PRN
  Administered 2017-10-14 – 2017-10-15 (×2): 1000 mg via ORAL
  Filled 2017-10-13 (×2): qty 2

## 2017-10-13 MED ORDER — HEPARIN SODIUM (PORCINE) 5000 UNIT/ML IJ SOLN
5000.0000 [IU] | Freq: Three times a day (TID) | INTRAMUSCULAR | Status: DC
  Administered 2017-10-14 – 2017-10-15 (×5): 5000 [IU] via SUBCUTANEOUS
  Filled 2017-10-13 (×5): qty 1

## 2017-10-13 MED ORDER — ONDANSETRON HCL 4 MG/2ML IJ SOLN
4.0000 mg | Freq: Four times a day (QID) | INTRAMUSCULAR | Status: DC | PRN
  Filled 2017-10-13: qty 2

## 2017-10-13 MED ORDER — ATORVASTATIN CALCIUM 20 MG PO TABS
20.0000 mg | ORAL_TABLET | Freq: Every day | ORAL | Status: DC
  Administered 2017-10-14: 20 mg via ORAL
  Filled 2017-10-13: qty 1

## 2017-10-13 MED ORDER — ONDANSETRON HCL 4 MG/2ML IJ SOLN
4.0000 mg | Freq: Once | INTRAMUSCULAR | Status: AC
  Administered 2017-10-13: 4 mg via INTRAVENOUS
  Filled 2017-10-13: qty 2

## 2017-10-13 MED ORDER — MOMETASONE FURO-FORMOTEROL FUM 200-5 MCG/ACT IN AERO
2.0000 | INHALATION_SPRAY | Freq: Two times a day (BID) | RESPIRATORY_TRACT | Status: DC
  Administered 2017-10-14 – 2017-10-15 (×2): 2 via RESPIRATORY_TRACT
  Filled 2017-10-13: qty 8.8

## 2017-10-13 MED ORDER — POLYETHYLENE GLYCOL 3350 17 G PO PACK: 17.0000 g | PACK | Freq: Every day | ORAL | Status: DC | PRN

## 2017-10-13 MED ORDER — SODIUM CHLORIDE 0.9 % IV BOLUS (SEPSIS)
1000.0000 mL | Freq: Once | INTRAVENOUS | Status: AC
  Administered 2017-10-13: 1000 mL via INTRAVENOUS

## 2017-10-13 MED ORDER — DOCUSATE SODIUM 100 MG PO CAPS
100.0000 mg | ORAL_CAPSULE | Freq: Two times a day (BID) | ORAL | Status: DC
  Administered 2017-10-14 – 2017-10-15 (×4): 100 mg via ORAL
  Filled 2017-10-13 (×4): qty 1

## 2017-10-13 MED ORDER — CEFTRIAXONE SODIUM IN DEXTROSE 20 MG/ML IV SOLN
1.0000 g | Freq: Once | INTRAVENOUS | Status: AC
  Administered 2017-10-13: 1 g via INTRAVENOUS
  Filled 2017-10-13: qty 50

## 2017-10-13 MED ORDER — ASPIRIN EC 81 MG PO TBEC
81.0000 mg | DELAYED_RELEASE_TABLET | Freq: Every day | ORAL | Status: DC
  Administered 2017-10-14 – 2017-10-15 (×2): 81 mg via ORAL
  Filled 2017-10-13 (×2): qty 1

## 2017-10-13 NOTE — ED Provider Notes (Signed)
[facility] Emergency Department Provider Note  ____________________________________________  Time seen: Approximately 7:15 PM  I have reviewed the triage vital signs and the nursing notes.   HISTORY  Chief Complaint Emesis   HPI [name] is a 83 y.o. female with h/o CKD, COPD, DM, HTN, HLD, sick sinus syndrome status post pacemaker who presents for evaluation of nausea and vomiting. Patient reports that she has not felt well since Thanksgiving when she was diagnosed with pneumonia. She reports that her cough has resolved however she still feeling pretty weak. Yesterday she started vomiting and has had 4 episodes of nonbloody nonbilious emesis. She is complaining of constant nausea since yesterday evening. No diarrhea or constipation, no abdominal pain, chest pain, cough, shortness of breath, fever, chills, dysuria or hematuria. Her nausea is severe and constant. Received 4mg  of zofran per EMS and reports improvement of her nausea.    Past Medical History:  Diagnosis Date  . Cervical disc disease   . Chronic kidney disease    stage 3  . COPD (chronic obstructive pulmonary disease) (HCC)   . Cystitis   . Degenerative joint disease   . Diabetes mellitus without complication (HCC)    DM type 2  . Diabetic retinopathy associated with type 2 diabetes mellitus, without macular edema   . Diverticulosis of colon    without mention of hemorrhage  . Gastric ulcer   . GERD (gastroesophageal reflux disease)   . Hyperlipemia   . Hypertension   . Mitral valve prolapse   . Premature atrial contraction   . Sick sinus syndrome (HCC)   . Sinoatrial node dysfunction [facility])     Patient Active Problem List   Diagnosis Date Noted  . CAP (community acquired pneumonia) 09/19/2017  . History of peptic ulcer disease 09/16/2017  . Gait instability 09/16/2017  . Obesity (BMI 30-39.9) 03/10/2017  . Trochanteric bursitis of left hip 03/10/2017  . CKD (chronic  kidney disease) stage 3, GFR 30-59 ml/min (HCC) 12/31/2016  . Moderate tricuspid insufficiency 12/31/2016  . B12 deficiency 05/20/2016  . Moderate mitral insufficiency 12/25/2015  . Bilateral carotid artery stenosis 11/27/2015  . Chronic venous insufficiency 10/09/2015  . LVH (left ventricular hypertrophy) due to hypertensive disease, with heart failure (HCC) 10/09/2015  . Controlled type 2 diabetes mellitus with stage 3 chronic kidney disease (HCC) 04/06/2015  . Edema leg 04/05/2015  . Hypertensive left ventricular hypertrophy 04/05/2015  . DDD (degenerative disc disease), lumbar 12/13/2014  . Neuritis or radiculitis due to rupture of lumbar intervertebral disc 12/13/2014  . Breathlessness on exertion 10/17/2014  . Hyperlipidemia due to type 2 diabetes mellitus (HCC) 08/15/2014  . COPD, mild (HCC) 08/14/2014  . Bladder infection, chronic 07/05/2014  . Incomplete bladder emptying 07/05/2014  . Neuralgia neuritis, sciatic nerve 07/05/2014  . Urge incontinence 07/05/2014  . FOM (frequency of micturition) 07/05/2014  . Benign essential HTN 06/14/2014  . Lumbar canal stenosis 06/14/2014  . SA node dysfunction (HCC) 06/14/2014  . Hypertension associated with diabetes (HCC) 06/14/2014    Past Surgical History:  Procedure Laterality Date  . ABDOMINAL HYSTERECTOMY    . BACK SURGERY     lumbar back surgery  . COLONOSCOPY    . ESOPHAGOGASTRODUODENOSCOPY    . gastric ulcer surgery    . KIDNEY SURGERY    . pacemaker     insertion dual chamber pacemaker generator  . PARATHYROIDECTOMY    . THYROIDECTOMY, PARTIAL     lobectomy partial thyroid    Prior  to Admission medications   Medication Sig Start Date End Date Taking? Authorizing Provider  acetaminophen (TYLENOL) 500 MG tablet Take 1,000 mg by mouth every 8 (eight) hours as needed.    [provider]  aspirin EC 81 MG tablet Take 81 mg by mouth daily.    [provider]  atorvastatin (LIPITOR) 20 MG tablet Take 20  mg by mouth daily.    [provider]  budesonide-formoterol (SYMBICORT) 160-4.5 MCG/ACT inhaler Inhale 1 puff into the lungs 2 (two) times daily as needed. 07/17/17   [provider]  carboxymethylcellulose (REFRESH TEARS) 0.5 % SOLN Apply to eye.    [provider]  carvedilol (COREG) 3.125 MG tablet Take 6.25 mg by mouth 2 (two) times daily with a meal.    [provider]  furosemide (LASIX) 20 MG tablet Take 20 mg by mouth daily.    [provider]  gabapentin (NEURONTIN) 300 MG capsule Take 1 capsule (300 mg total) by mouth 2 (two) times daily. 09/16/17   Plonk, Chrissie Noa, MD  glipiZIDE (GLUCOTROL XL) 5 MG 24 hr tablet Take 10 mg by mouth 2 (two) times daily.     [provider]  glucose blood (PRECISION QID TEST) test strip Use 2 (two) times daily. True metrix meter. Diagnosis: E11.29 06/01/17 06/01/18  [provider]  isosorbide mononitrate (IMDUR) 60 MG 24 hr tablet Take 60 mg by mouth daily.     [provider]  losartan (COZAAR) 100 MG tablet Take 100 mg by mouth daily.  03/20/17 03/20/18  [provider]  meloxicam (MOBIC) 7.5 MG tablet Take 1 tablet by mouth daily. 09/11/17   [provider]  pioglitazone (ACTOS) 30 MG tablet Take 30 mg by mouth daily.    [provider]  vitamin B-12 (CYANOCOBALAMIN) 1000 MCG tablet Take 1,000 mcg by mouth daily.    [provider]    Allergies Darvon [propoxyphene]; Hydrocodone; Janumet [sitagliptin-metformin hcl]; Motrin [ibuprofen]; and Tramadol  Family History  Problem Relation Age of Onset  . Breast cancer Mother 61  . Breast cancer Other   . Breast cancer Maternal Aunt        2 mat aunts    Social History Social History   Tobacco Use  . Smoking status: Never Smoker  . Smokeless tobacco: Never Used  Substance Use Topics  . Alcohol use: No    Alcohol/week: 0.0 oz  . Drug use: No    Review of Systems  Constitutional: Negative for  fever. + Generalize weakness Eyes: Negative for visual changes. ENT: Negative for sore throat. Neck: No neck pain  Cardiovascular: Negative for chest pain. Respiratory: Negative for shortness of breath. Gastrointestinal: Negative for abdominal pain, diarrhea. + N/V Genitourinary: Negative for dysuria. Musculoskeletal: Negative for back pain. Skin: Negative for rash. Neurological: Negative for headaches, weakness or numbness. Psych: No SI or HI  ____________________________________________   PHYSICAL EXAM:  VITAL SIGNS: ED Triage Vitals  Enc Vitals Group     BP 10/13/17 1847 110/63     Pulse Rate 10/13/17 1847 74     Resp 10/13/17 1847 20     Temp 10/13/17 1847 98.1 F (36.7 C)     Temp Source 10/13/17 1847 Oral     SpO2 10/13/17 1847 94 %     Weight 10/13/17 1846 169 lb (76.7 kg)     Height 10/13/17 1846 5\' 4"  (1.626 m)     Head Circumference --      Peak Flow --  Pain Score --      Pain Loc --      Pain Edu? --      Excl. in GC? --     Constitutional: Alert and oriented. Well appearing and in no apparent distress. HEENT:      Head: Normocephalic and atraumatic.         Eyes: Conjunctivae are normal. Sclera is non-icteric.       Mouth/Throat: Mucous membranes are dry.       Neck: Supple with no signs of meningismus. Cardiovascular: Regular rate and rhythm. No murmurs, gallops, or rubs. 2+ symmetrical distal pulses are present in all extremities. No JVD. Respiratory: Normal respiratory effort. Lungs are clear to auscultation bilaterally. No wheezes, crackles, or rhonchi.  Gastrointestinal: Soft, non tender, and non distended with positive bowel sounds. No rebound or guarding. Musculoskeletal: Nontender with normal range of motion in all extremities. No edema, cyanosis, or erythema of extremities. Neurologic: Normal speech and language. Face is symmetric. Moving all extremities. No gross focal neurologic deficits are appreciated. Skin: Skin is warm, dry and intact.  No rash noted. Psychiatric: Mood and affect are normal. Speech and behavior are normal.  ____________________________________________   LABS (all labs ordered are listed, but only abnormal results are displayed)  Labs Reviewed  CBC WITH DIFFERENTIAL/PLATELET - Abnormal; Notable for the following components:      Result Value   RDW 15.1 (*)    Lymphs Abs 0.9 (*)    All other components within normal limits  URINALYSIS, COMPLETE (UACMP) WITH MICROSCOPIC - Abnormal; Notable for the following components:   Color, Urine YELLOW (*)    APPearance CLOUDY (*)    Leukocytes, UA TRACE (*)    Bacteria, UA MANY (*)    Squamous Epithelial / LPF 6-30 (*)    All other components within normal limits  COMPREHENSIVE METABOLIC PANEL - Abnormal; Notable for the following components:   Chloride 97 (*)    Glucose, Bld 205 (*)    BUN 50 (*)    Creatinine, Ser 2.67 (*)    GFR calc non Af Amer 15 (*)    GFR calc Af Amer 18 (*)    All other components within normal limits  URINE CULTURE  TROPONIN I   ____________________________________________  EKG  ED ECG REPORT I, Nita Sicklearolina Euretha Najarro, the attending physician, personally viewed and interpreted this ECG.  Atrial paced rhythm rate of 81, normal QTC, right bundle branch block, no ST elevations or depressions. No recent prior for comparison  ____________________________________________  RADIOLOGY  CXR:  Low volumes, bibasilar atelectasis. ____________________________________________   PROCEDURES  Procedure(s) performed:yes Angiocath insertion Date/Time: 10/13/2017 7:20 PM Performed by: Nita SickleVeronese, [place], MD Authorized by: Nita SickleVeronese, [place], MD  Consent: Verbal consent obtained. Risks and benefits: risks, benefits and alternatives were discussed Consent given by: patient Patient identity confirmed: hospital-assigned identification number Preparation: Patient was prepped and draped in the usual sterile fashion. Local anesthesia used:  no  Anesthesia: Local anesthesia used: no  Sedation: Patient sedated: no  Patient tolerance: Patient tolerated the procedure well with no immediate complications Comments: US guided placement of 20 gauge IV in the L proximal arm.  Blood return and flushed easily.       Critical Care performed:  None ____________________________________________   INITIAL IMPRESSION / ASSESSMENT AND PLAN / ED COURSE   83 y.o. female with h/o CKD, COPD, DM, HTN, HLD, sick sinus syndrome status post pacemaker who presents for evaluation of nausea and vomiting since last night. Patient looks  dry on exam with normal vital signs, physical exam and no acute findings. We'll check CBC, CMP, and urinalysis to rule out DKA versus infection versus acute on chronic kidney injury versus anemia. We'll repeat chest x-ray to make sure her pneumonia has cleared. EKG shows no ischemic changes. We'll give IV fluids.    _________________________ 8:57 PM on 10/13/2017 ----------------------------------------- Labs consistent with acute on chronic kidney injury and a UTI for which patient was given Rocephin. She has received a liter of normal saline. I will admit her to the hospitalist service for IV hydration and further management.    As part of my medical decision making, I reviewed the following data within the electronic MEDICAL RECORD NUMBER Nursing notes reviewed and incorporated, Labs reviewed , EKG interpreted , Old EKG reviewed, Radiograph reviewed , Discussed with admitting physician , Notes from prior ED visits and [place] Controlled Substance Database    Pertinent labs & imaging results that were available during my care of the patient were reviewed by me and considered in my medical decision making (see chart for details).    ____________________________________________   FINAL CLINICAL IMPRESSION(S) / ED DIAGNOSES  Final diagnoses:  Acute renal failure superimposed on chronic kidney disease, unspecified  CKD stage, unspecified acute renal failure type (HCC)  Acute cystitis with hematuria  Nausea and vomiting, intractability of vomiting not specified, unspecified vomiting type      NEW MEDICATIONS STARTED DURING THIS VISIT:  ED Discharge Orders    None       Note:  This document was prepared using Dragon voice recognition software and may include unintentional dictation errors.    Nita SickleVeronese, [place], MD 10/13/17 [phone]

## 2017-10-13 NOTE — ED Notes (Signed)
Pt brought in via ems from home with n/v  Sx for several weeks, but worse this week.  Vomited x 1 today.  No diarrhea.  No abd pain.  No chest pain.  Pt states I feel weak.  zofran given by ems en route.

## 2017-10-13 NOTE — ED Triage Notes (Signed)
Pt brought in via ems from home with nausea and vomiting.  Vomited x 1  No diarrhea.  Pt alert.  zofran 4mg  given IM by ems.

## 2017-10-13 NOTE — ED Notes (Signed)
AMY RN, aware of recollect of light green

## 2017-10-13 NOTE — H&P (Signed)
[facility]   PATIENT NAME: [name]    MR#:  [id number]  DATE OF BIRTH:  1931-04-02  DATE OF ADMISSION:  10/13/2017  PRIMARY CARE PHYSICIAN: [name], MD   REQUESTING/REFERRING PHYSICIAN:  Dr. Nita Sickle  CHIEF COMPLAINT:   Chief Complaint  Patient presents with  . Emesis    HISTORY OF PRESENT ILLNESS:  Smantha Boakye  is a 83 y.o. female with multiple medical problems including CK D stage III with baseline creatinine of 1.5, diabetes, COPD not on home oxygen, hypertension, sick sinus syndrome status post pacemaker, arthritis presents to hospital secondary to extreme weakness and nausea and vomiting that started yesterday. Patient was in the hospital last month for pneumonia. She has recovered well from that. She was discharged on Levaquin at the time. She received a pneumonia shot as an outpatient couple of days ago. Since then started feeling weak. Has decreased oral intake especially fluid intake. She is on Lasix at home. She had nausea and vomiting since last night and so presented to the emergency room. Urine analysis revealed significant UTI. And also lab work with acute on chronic renal failure noted.  PAST MEDICAL HISTORY:   Past Medical History:  Diagnosis Date  . Cervical disc disease   . Chronic kidney disease    stage 3  . COPD (chronic obstructive pulmonary disease) (HCC)   . Cystitis   . Degenerative joint disease   . Diabetes mellitus without complication (HCC)    DM type 2  . Diabetic retinopathy associated with type 2 diabetes mellitus, without macular edema   . Diverticulosis of colon    without mention of hemorrhage  . Gastric ulcer   . GERD (gastroesophageal reflux disease)   . Hyperlipemia   . Hypertension   . Mitral valve prolapse   . Premature atrial contraction   . Sick sinus syndrome (HCC)   . Sinoatrial node dysfunction (HCC)     PAST SURGICAL HISTORY:   Past Surgical History:    Procedure Laterality Date  . ABDOMINAL HYSTERECTOMY    . BACK SURGERY     lumbar back surgery  . COLONOSCOPY    . ESOPHAGOGASTRODUODENOSCOPY    . gastric ulcer surgery    . KIDNEY SURGERY    . pacemaker     insertion dual chamber pacemaker generator  . PARATHYROIDECTOMY    . THYROIDECTOMY, PARTIAL     lobectomy partial thyroid    SOCIAL HISTORY:   Social History   Tobacco Use  . Smoking status: Never Smoker  . Smokeless tobacco: Never Used  Substance Use Topics  . Alcohol use: No    Alcohol/week: 0.0 oz    FAMILY HISTORY:   Family History  Problem Relation Age of Onset  . Breast cancer Mother 44  . Breast cancer Other   . Breast cancer Maternal Aunt        2 mat aunts    DRUG ALLERGIES:   Allergies  Allergen Reactions  . Darvon [Propoxyphene] Nausea And Vomiting  . Hydrocodone Nausea And Vomiting  . Janumet [Sitagliptin-Metformin Hcl] Nausea And Vomiting  . Motrin [Ibuprofen] Nausea And Vomiting  . Tramadol Nausea And Vomiting    Can tolerate this medication in small doses (takes half a pill).    REVIEW OF SYSTEMS:   Review of Systems  Constitutional: Positive for malaise/fatigue. Negative for chills, fever and weight loss.  HENT: Negative for ear discharge, ear pain, hearing loss and nosebleeds.  Eyes: Negative for blurred vision, double vision and photophobia.  Respiratory: Negative for cough, hemoptysis, shortness of breath and wheezing.   Cardiovascular: Negative for chest pain, palpitations, orthopnea and leg swelling.  Gastrointestinal: Positive for nausea. Negative for abdominal pain, constipation, diarrhea, heartburn, melena and vomiting.  Genitourinary: Negative for dysuria, frequency and urgency.  Musculoskeletal: Positive for back pain. Negative for myalgias and neck pain.  Skin: Negative for rash.  Neurological: Negative for dizziness, tingling, tremors, sensory change, speech change, focal weakness and headaches.  Endo/Heme/Allergies: Does  not bruise/bleed easily.  Psychiatric/Behavioral: Negative for depression.    MEDICATIONS AT HOME:   Prior to Admission medications   Medication Sig Start Date End Date Taking? Authorizing Provider  acetaminophen (TYLENOL) 500 MG tablet Take 1,000 mg by mouth every 8 (eight) hours as needed.    [provider]  aspirin EC 81 MG tablet Take 81 mg by mouth daily.    [provider]  atorvastatin (LIPITOR) 20 MG tablet Take 20 mg by mouth daily.    [provider]  budesonide-formoterol (SYMBICORT) 160-4.5 MCG/ACT inhaler Inhale 1 puff into the lungs 2 (two) times daily as needed. 07/17/17   [provider]  carboxymethylcellulose (REFRESH TEARS) 0.5 % SOLN Apply to eye.    [provider]  carvedilol (COREG) 3.125 MG tablet Take 6.25 mg by mouth 2 (two) times daily with a meal.    [provider]  furosemide (LASIX) 20 MG tablet Take 20 mg by mouth daily.    [provider]  gabapentin (NEURONTIN) 300 MG capsule Take 1 capsule (300 mg total) by mouth 2 (two) times daily. 09/16/17   Plonk, Chrissie NoaWilliam, MD  glipiZIDE (GLUCOTROL XL) 5 MG 24 hr tablet Take 10 mg by mouth 2 (two) times daily.     [provider]  glucose blood (PRECISION QID TEST) test strip Use 2 (two) times daily. True metrix meter. Diagnosis: E11.29 06/01/17 06/01/18  [provider]  isosorbide mononitrate (IMDUR) 60 MG 24 hr tablet Take 60 mg by mouth daily.     [provider]  losartan (COZAAR) 100 MG tablet Take 100 mg by mouth daily.  03/20/17 03/20/18  [provider]  meloxicam (MOBIC) 7.5 MG tablet Take 1 tablet by mouth daily. 09/11/17   [provider]  pioglitazone (ACTOS) 30 MG tablet Take 30 mg by mouth daily.    [provider]  vitamin B-12 (CYANOCOBALAMIN) 1000 MCG tablet Take 1,000 mcg by mouth daily.    [provider]      VITAL SIGNS:  Blood pressure (!) 109/53, pulse 60, temperature 98.1 F  (36.7 C), temperature source Oral, resp. rate 14, height 5\' 4"  (1.626 m), weight 76.7 kg (169 lb), SpO2 97 %.  PHYSICAL EXAMINATION:   Physical Exam  GENERAL:  83 y.o.-year-old patient lying in the bed with no acute distress.  EYES: Pupils equal, round, reactive to light and accommodation. No scleral icterus. Extraocular muscles intact.  HEENT: Head atraumatic, normocephalic. Oropharynx and nasopharynx clear.  NECK:  Supple, no jugular venous distention. No thyroid enlargement, no tenderness.  LUNGS: Normal breath sounds bilaterally, no wheezing, rales,rhonchi or crepitation. No use of accessory muscles of respiration.  CARDIOVASCULAR: S1, S2 normal. No murmurs, rubs, or gallops.  ABDOMEN: Soft, nontender, nondistended. Bowel sounds present. No organomegaly or mass.  EXTREMITIES: No pedal edema, cyanosis, or clubbing.  NEUROLOGIC: Cranial nerves II through XII are intact. Muscle strength 5/5 in all extremities. Sensation intact. Gait not checked. Global weakness  PSYCHIATRIC: The patient is alert and oriented x 3.  SKIN: No obvious rash, lesion, or ulcer.   LABORATORY PANEL:   CBC Recent Labs  Lab 10/13/17 1851  WBC 6.0  HGB 13.4  HCT 40.7  PLT 200   ------------------------------------------------------------------------------------------------------------------  Chemistries  Recent Labs  Lab 10/13/17 1936  NA 136  K 3.9  CL 97*  CO2 27  GLUCOSE 205*  BUN 50*  CREATININE 2.67*  CALCIUM 10.0  AST 26  ALT 26  ALKPHOS 68  BILITOT 0.6   ------------------------------------------------------------------------------------------------------------------  Cardiac Enzymes Recent Labs  Lab 10/13/17 1936  TROPONINI <0.03   ------------------------------------------------------------------------------------------------------------------  RADIOLOGY:  Dg Chest 2 View  Result Date: 10/13/2017 CLINICAL DATA:  Nausea, vomiting, generalized weakness EXAM: CHEST  2 VIEW  COMPARISON:  09/19/2017 FINDINGS: Left pacer remains in place, unchanged. Low lung volumes with bibasilar atelectasis. Heart is borderline in size. No effusions or acute bony abnormality. IMPRESSION: Low volumes, bibasilar atelectasis. Electronically Signed   By: Charlett NoseKevin  Dover M.D.   On: 10/13/2017 19:54    EKG:   Orders placed or performed during the hospital encounter of 10/13/17  . ED EKG  . ED EKG    IMPRESSION AND PLAN:   Rondel BatonLouise Cooperman  is a 83 y.o. female with multiple medical problems including CK D stage III with baseline creatinine of 1.5, diabetes, COPD not on home oxygen, hypertension, sick sinus syndrome status post pacemaker, arthritis presents to hospital secondary to extreme weakness and nausea and vomiting that started yesterday.  1. Acute renal failure on CK D stage III-baseline creatinine of 1.5, likely prerenal as a tenia. -IV fluids and monitor. Avoid nephrotoxins. Hold Lasix and meloxicam. - urine output monitoring  2. Acute cystitis-follow-up urine cultures. Continue Rocephin.  3. Diabetes mellitus-hold oral medications Actos and glipizide. Sliding scale insulin for now. Follow-up A1c  4. Hypertension-continue home medications. Patient on carvedilol, Imdur. Hold losartan  5. Subcutaneous heparin for DVT prophylaxis  Physical therapy consulted for weakness-    All the records are reviewed and case discussed with ED provider. Management plans discussed with the patient, family and they are in agreement.  CODE STATUS: Full Code  TOTAL TIME TAKING CARE OF THIS PATIENT: 50 minutes.    Enid BaasKALISETTI,Shuayb Schepers M.D on 10/13/2017 at 9:14 PM  Between 7am to 6pm - Pager - [phone]  After 6pm go to [email or web address] - Social research officer, governmentpassword EPAS [email or web address]  [facility]  Office  [phone]  CC: Primary care physician; Danella PentonMiller, Mark F, MD

## 2017-10-13 NOTE — ED Notes (Signed)
Report called to carly rn floor nurse  

## 2017-10-13 NOTE — Progress Notes (Signed)
Pharmacy Antibiotic Note  [name] is a 83 y.o. female admitted on 10/13/2017 with UTI.  Pharmacy has been consulted for ceftriaxone dosing.  Plan: Will start patient on ceftriaxone 1g IV daily for 5 days total  Height: 5\' 4"  (162.6 cm) Weight: 169 lb (76.7 kg) IBW/kg (Calculated) : 54.7  Temp (24hrs), Avg:97.7 F (36.5 C), Min:97.3 F (36.3 C), Max:98.1 F (36.7 C)  Recent Labs  Lab 10/13/17 1851 10/13/17 1936  WBC 6.0  --   CREATININE  --  2.67*    Estimated Creatinine Clearance: 15.7 mL/min (A) (by C-G formula based on SCr of 2.67 mg/dL (H)).    Allergies  Allergen Reactions  . Darvon [Propoxyphene] Nausea And Vomiting  . Hydrocodone Nausea And Vomiting  . Janumet [Sitagliptin-Metformin Hcl] Nausea And Vomiting  . Motrin [Ibuprofen] Nausea And Vomiting  . Tramadol Nausea And Vomiting    Can tolerate this medication in small doses (takes half a pill).    Thank you for allowing pharmacy to be a part of this patient's care.  Thomasene Rippleavid Ivah Girardot, PharmD, BCPS Clinical Pharmacist 10/13/2017

## 2017-10-13 NOTE — ED Notes (Signed)
primedoc in with pt for admission.  

## 2017-10-14 ENCOUNTER — Telehealth: Payer: Self-pay | Admitting: Internal Medicine

## 2017-10-14 ENCOUNTER — Other Ambulatory Visit: Payer: Self-pay

## 2017-10-14 LAB — BASIC METABOLIC PANEL
Anion gap: 9 (ref 5–15)
BUN: 48 mg/dL — AB (ref 6–20)
CHLORIDE: 103 mmol/L (ref 101–111)
CO2: 23 mmol/L (ref 22–32)
Calcium: 9.3 mg/dL (ref 8.9–10.3)
Creatinine, Ser: 2.41 mg/dL — ABNORMAL HIGH (ref 0.44–1.00)
GFR calc Af Amer: 20 mL/min — ABNORMAL LOW (ref >60)
GFR calc non Af Amer: 17 mL/min — ABNORMAL LOW (ref >60)
GLUCOSE: 150 mg/dL — AB (ref 65–99)
POTASSIUM: 3.7 mmol/L (ref 3.5–5.1)
Sodium: 135 mmol/L (ref 135–145)

## 2017-10-14 LAB — CBC
HEMATOCRIT: 34.9 % — AB (ref 35.0–47.0)
Hemoglobin: 11.3 g/dL — ABNORMAL LOW (ref 12.0–16.0)
MCH: 27.9 pg (ref 26.0–34.0)
MCHC: 32.4 g/dL (ref 32.0–36.0)
MCV: 86.3 fL (ref 80.0–100.0)
Platelets: 147 10*3/uL — ABNORMAL LOW (ref 150–440)
RBC: 4.04 MIL/uL (ref 3.80–5.20)
RDW: 14.3 % (ref 11.5–14.5)
WBC: 4.6 10*3/uL (ref 3.6–11.0)

## 2017-10-14 LAB — GLUCOSE, CAPILLARY
GLUCOSE-CAPILLARY: 188 mg/dL — AB (ref 65–99)
Glucose-Capillary: 125 mg/dL — ABNORMAL HIGH (ref 65–99)

## 2017-10-14 LAB — HEMOGLOBIN A1C
Hgb A1c MFr Bld: 8.5 % — ABNORMAL HIGH (ref 4.8–5.6)
Mean Plasma Glucose: 197.25 mg/dL

## 2017-10-14 MED ORDER — INSULIN ASPART 100 UNIT/ML ~~LOC~~ SOLN: 0.0000 [IU] | Freq: Every day | SUBCUTANEOUS | Status: DC

## 2017-10-14 MED ORDER — INSULIN ASPART 100 UNIT/ML ~~LOC~~ SOLN
0.0000 [IU] | Freq: Three times a day (TID) | SUBCUTANEOUS | Status: DC
  Administered 2017-10-14: 2 [IU] via SUBCUTANEOUS
  Administered 2017-10-15: 1 [IU] via SUBCUTANEOUS
  Filled 2017-10-14 (×3): qty 1

## 2017-10-14 MED ORDER — PREMIER PROTEIN SHAKE
11.0000 [oz_av] | Freq: Two times a day (BID) | ORAL | Status: DC
  Administered 2017-10-14 – 2017-10-15 (×3): 11 [oz_av] via ORAL

## 2017-10-14 NOTE — Progress Notes (Signed)
[employer] - [place] [facility]                                                                                                                                                                                  Patient Demographics   [name], is a 83 y.o. female, DOB - 03-Apr-1931, ONG:[id number]  Admit date - 10/13/2017   Admitting Physician Enid Baas, MD  Outpatient Primary MD for the patient is [name], MD   LOS - 1  Subjective: Patient still feeling very weak.    Review of Systems:   CONSTITUTIONAL: No documented fever. Positive fatigue, positive weakness. No weight gain, no weight loss.  EYES: No blurry or double vision.  ENT: No tinnitus. No postnasal drip. No redness of the oropharynx.  RESPIRATORY: No cough, no wheeze, no hemoptysis. No dyspnea.  CARDIOVASCULAR: No chest pain. No orthopnea. No palpitations. No syncope.  GASTROINTESTINAL: No nausea, no vomiting or diarrhea. No abdominal pain. No melena or hematochezia.  GENITOURINARY: No dysuria or hematuria.  ENDOCRINE: No polyuria or nocturia. No heat or cold intolerance.  HEMATOLOGY: No anemia. No bruising. No bleeding.  INTEGUMENTARY: No rashes. No lesions.  MUSCULOSKELETAL: No arthritis. No swelling. No gout.  NEUROLOGIC: No numbness, tingling, or ataxia. No seizure-type activity.  PSYCHIATRIC: No anxiety. No insomnia. No ADD.    Vitals:   Vitals:   10/13/17 2200 10/13/17 2252 10/14/17 0501 10/14/17 1406  BP: 111/61 (!) 116/52 (!) 115/54 (!) 99/50  Pulse:  66 62 60  Resp: 15 18 18 16   Temp:  (!) 97.3 F (36.3 C) 98 F (36.7 C) 97.7 F (36.5 C)  TempSrc:  Oral Oral Oral  SpO2:  93% 99% 95%  Weight:      Height:        Wt Readings from Last 3 Encounters:  10/13/17 169 lb (76.7 kg)  09/19/17 140 lb (63.5 kg)  09/16/17 180 lb 4.8 oz (81.8 kg)     Intake/Output Summary (Last 24 hours) at 10/14/2017 1604 Last data filed at 10/14/2017 1502 Gross per 24 hour  Intake  1471.75 ml  Output -  Net 1471.75 ml    Physical Exam:   GENERAL: Pleasant-appearing in no apparent distress.  HEAD, EYES, EARS, NOSE AND THROAT: Atraumatic, normocephalic. Extraocular muscles are intact. Pupils equal and reactive to light. Sclerae anicteric. No conjunctival injection. No oro-pharyngeal erythema.  NECK: Supple. There is no jugular venous distention. No bruits, no lymphadenopathy, no thyromegaly.  HEART: Regular rate and rhythm,. No murmurs, no rubs, no clicks.  LUNGS: Clear to auscultation bilaterally. No rales or rhonchi. No wheezes.  ABDOMEN: Soft, flat, nontender, nondistended. Has good bowel sounds.  No hepatosplenomegaly appreciated.  EXTREMITIES: No evidence of any cyanosis, clubbing, or peripheral edema.  +2 pedal and radial pulses bilaterally.  NEUROLOGIC: The patient is alert, awake, and oriented x3 with no focal motor or sensory deficits appreciated bilaterally.  SKIN: Moist and warm with no rashes appreciated.  Psych: Not anxious, depressed LN: No inguinal LN enlargement    Antibiotics   Anti-infectives (From admission, onward)   Start     Dose/Rate Route Frequency Ordered Stop   10/14/17 1000  cefTRIAXone (ROCEPHIN) 1 g in dextrose 5 % 50 mL IVPB     1 g 100 mL/hr over 30 Minutes Intravenous Every 24 hours 10/13/17 2303     10/13/17 2000  cefTRIAXone (ROCEPHIN) 1 g in dextrose 5 % 50 mL IVPB - Premix     1 g 100 mL/hr over 30 Minutes Intravenous  Once 10/13/17 1946 10/13/17 2123      Medications   Scheduled Meds: . aspirin EC  81 mg Oral Daily  . atorvastatin  20 mg Oral Daily  . carvedilol  6.25 mg Oral BID WC  . docusate sodium  100 mg Oral BID  . gabapentin  300 mg Oral BID  . heparin  5,000 Units Subcutaneous Q8H  . insulin aspart  0-5 Units Subcutaneous QHS  . insulin aspart  0-9 Units Subcutaneous TID WC  . isosorbide mononitrate  60 mg Oral Daily  . mometasone-formoterol  2 puff Inhalation BID  . protein supplement shake  11 oz Oral BID  BM  . vitamin B-12  1,000 mcg Oral Daily   Continuous Infusions: . sodium chloride 75 mL/hr at 10/14/17 1500  . cefTRIAXone (ROCEPHIN) IVPB 1 gram/50 mL D5W Stopped (10/14/17 1129)   PRN Meds:.acetaminophen, ondansetron **OR** ondansetron (ZOFRAN) IV, polyethylene glycol   Data Review:   Micro Results No results found for this or any previous visit (from the past 240 hour(s)).  Radiology Reports Dg Chest 2 View  Result Date: 10/13/2017 CLINICAL DATA:  Nausea, vomiting, generalized weakness EXAM: CHEST  2 VIEW COMPARISON:  09/19/2017 FINDINGS: Left pacer remains in place, unchanged. Low lung volumes with bibasilar atelectasis. Heart is borderline in size. No effusions or acute bony abnormality. IMPRESSION: Low volumes, bibasilar atelectasis. Electronically Signed   By: Charlett NoseKevin  Dover M.D.   On: 10/13/2017 19:54   Ct Head Wo Contrast  Result Date: 09/19/2017 CLINICAL DATA:  Unwitnessed fall today. Found down. Unexplained altered level consciousness. EXAM: CT HEAD WITHOUT CONTRAST TECHNIQUE: Contiguous axial images were obtained from the base of the skull through the vertex without intravenous contrast. COMPARISON:  None. FINDINGS: Brain: No evidence of acute infarction, hemorrhage, hydrocephalus, extra-axial collection, or mass lesion/mass effect. Mild diffuse cerebral atrophy and chronic small vessel disease. Vascular:  No hyperdense vessel or other acute findings. Skull: No evidence of fracture or other significant bone abnormality. Sinuses/Orbits: No acute findings. Mild chronic mucosal thickening involving bilateral ethmoid sinuses. Other: None. IMPRESSION: No acute intracranial abnormality. Mild cerebral atrophy and chronic small vessel disease. Electronically Signed   By: Myles RosenthalJohn  Stahl M.D.   On: 09/19/2017 19:32   Dg Chest Port 1 View  Result Date: 09/19/2017 CLINICAL DATA:  Code sepsis.  Weakness and confusion. EXAM: PORTABLE CHEST 1 VIEW COMPARISON:  03/05/2016 FINDINGS: Stable  cardiomegaly with aortic atherosclerosis. Left-sided pacemaker apparatus with right atrial and right ventricular leads. Surgical clips are seen at the GE juncture. Confluent opacity in the right upper lobe suspicious for pneumonia. No overt pulmonary edema. No effusion. No acute osseous abnormality.  IMPRESSION: 1. Pneumonic consolidation in the right upper lobe. 2. Stable cardiomegaly with aortic atherosclerosis. Electronically Signed   By: Tollie Ethavid  Kwon M.D.   On: 09/19/2017 19:16     CBC Recent Labs  Lab 10/13/17 1851 10/14/17 1015  WBC 6.0 4.6  HGB 13.4 11.3*  HCT 40.7 34.9*  PLT 200 147*  MCV 86.1 86.3  MCH 28.3 27.9  MCHC 32.9 32.4  RDW 15.1* 14.3  LYMPHSABS 0.9*  --   MONOABS 0.6  --   EOSABS 0.1  --   BASOSABS 0.0  --     Chemistries  Recent Labs  Lab 10/13/17 1936 10/14/17 0500  NA 136 135  K 3.9 3.7  CL 97* 103  CO2 27 23  GLUCOSE 205* 150*  BUN 50* 48*  CREATININE 2.67* 2.41*  CALCIUM 10.0 9.3  AST 26  --   ALT 26  --   ALKPHOS 68  --   BILITOT 0.6  --    ------------------------------------------------------------------------------------------------------------------ estimated creatinine clearance is 17.4 mL/min (A) (by C-G formula based on SCr of 2.41 mg/dL (H)). ------------------------------------------------------------------------------------------------------------------ Recent Labs    10/14/17 1015  HGBA1C 8.5*   ------------------------------------------------------------------------------------------------------------------ No results for input(s): CHOL, HDL, LDLCALC, TRIG, CHOLHDL, LDLDIRECT in the last 72 hours. ------------------------------------------------------------------------------------------------------------------ No results for input(s): TSH, T4TOTAL, T3FREE, THYROIDAB in the last 72 hours.  Invalid input(s):  FREET3 ------------------------------------------------------------------------------------------------------------------ No results for input(s): VITAMINB12, FOLATE, FERRITIN, TIBC, IRON, RETICCTPCT in the last 72 hours.  Coagulation profile No results for input(s): INR, PROTIME in the last 168 hours.  No results for input(s): DDIMER in the last 72 hours.  Cardiac Enzymes Recent Labs  Lab 10/13/17 1936  TROPONINI <0.03   ------------------------------------------------------------------------------------------------------------------ Invalid input(s): POCBNP    Assessment & Plan   Rondel BatonLouise Bjorkman  is a 83 y.o. female with multiple medical problems including CK D stage III with baseline creatinine of 1.5, diabetes, COPD not on home oxygen, hypertension, sick sinus syndrome status post pacemaker, arthritis presents to hospital secondary to extreme weakness and nausea and vomiting that started yesterday.  1. Acute renal failure on CK D stage III-baseline creatinine of 1.5,  Patient renal function is somewhat improved with IV fluids continue to monitor Avoid nephrotoxins. Hold Lasix and meloxicam. - urine output monitoring  2. Acute cystitis-continue antibiotics urine cultures pending  3. Diabetes mellitus-hold oral medications Actos and glipizide. Sliding scale insulin for now. Follow-up A1c  4. Hypertension-continue home medications. Patient on carvedilol, Imdur. Hold losartan  5. Subcutaneous heparin for DVT prophylaxis         Code Status Orders  (From admission, onward)        Start     Ordered   10/13/17 2243  Full code  Continuous     10/13/17 2242    Code Status History    Date Active Date Inactive Code Status Order ID Comments User Context   09/19/2017 22:58 09/21/2017 17:02 Full Code [id number]  SalaryEvelena Asa, Montell D, MD Inpatient           Consults none   DVT Prophylaxis  Lovenox   Lab Results  Component Value Date   PLT 147 (L) 10/14/2017      Time Spent in minutes 35 minutes Greater than 50% of time spent in care coordination and counseling patient regarding the condition and plan of care.   Auburn BilberryPATEL, Tylee Newby M.D on 10/14/2017 at 4:04 PM  Between 7am to 6pm - Pager - [phone]  After 6pm go to [email or web address] - password [email or web address]  [facility]   Office  [phone]

## 2017-10-14 NOTE — Progress Notes (Signed)
Initial Nutrition Assessment  DOCUMENTATION CODES:   Obesity unspecified  INTERVENTION:   Premier Protein BID, each supplement provides 160 kcal and 30 grams of protein.   Liberalize fat restriction from diet as this restricts protein as well  NUTRITION DIAGNOSIS:   Inadequate oral intake related to acute illness as evidenced by per patient/family report.  GOAL:   Patient will meet greater than or equal to 90% of their needs  MONITOR:   PO intake, Supplement acceptance, Labs, Weight trends, I & O's  REASON FOR ASSESSMENT:   Malnutrition Screening Tool    ASSESSMENT:   83 y.o. female with multiple medical problems including CKD stage III with baseline creatinine of 1.5, diabetes, COPD not on home oxygen, hypertension, sick sinus syndrome status post pacemaker, arthritis presents to hospital secondary to extreme weakness and nausea and vomiting  Met with pt in room today. Pt reports poor appetite and oral intake for 1 week pta. Pt reports her appetite is improved today; pt ate 90% of her breakfast. Per chart, pt with 11lb(6%) wt loss over the past month; this is significant given the time frame. Pt with recent pneumonia x 1 month ago. Pt prefers chocolate or strawberry supplements; RD will order Premier protein   Medications reviewed and include: aspirin, colace, heparin, B12, NaCl _0 /hr, ceftriaxone, zofran  Labs reviewed: BUN 48(H), creat 2.41(H) cbgs- 205, 150 x 24 hrs  Nutrition-Focused physical exam completed. Findings are no fat depletion, no muscle depletion, and no edema.   Diet Order:  Diet Carb Modified Fluid consistency: Thin; Room service appropriate? Yes  EDUCATION NEEDS:   Education needs have been addressed  Skin:  Reviewed RN Assessment  Last BM:  12/18  Height:   Ht Readings from Last 1 Encounters:  10/13/17 _1  (1.626 m)    Weight:   Wt Readings from Last 1 Encounters:  10/13/17 169 lb (76.7 kg)    Ideal Body Weight:  54.5  kg  BMI:  Body mass index is 29.01 kg/m.  Estimated Nutritional Needs:   Kcal:  1500-1700kcal/day   Protein:  77-85g/day   Fluid:  >1.6L/day   Koleen Distance MS, RD, LDN Pager #[phone] After Hours Pager: [phone]

## 2017-10-14 NOTE — Telephone Encounter (Signed)
Called pt to sched for AWV with Nurse Health Advisor.  C/b #  [phone] on Skype [email or web address] if you have questions ° °

## 2017-10-14 NOTE — Progress Notes (Signed)
   10/14/17 0900  Clinical Encounter Type  Visited With Patient and family together  Visit Type Initial;Other (Comment) (HCPOA)  Referral From Nurse;Patient  Spiritual Encounters  Spiritual Needs Literature;Other (Comment) (HCPOA)  HCPOA/AD materials dropped off with patient.  CH available to review as needed.

## 2017-10-14 NOTE — Evaluation (Signed)
Physical Therapy Evaluation Patient Details Name: [name] MRN: [id number] DOB: 02/25/1931 Today's Date: 10/14/2017   History of Present Illness  [name]  is a 83 y.o. female with multiple medical problems including CK D stage III with baseline creatinine of 1.5, diabetes, COPD not on home oxygen, hypertension, sick sinus syndrome status post pacemaker, arthritis presents to hospital secondary to extreme weakness and nausea and vomiting that started yesterday.    Clinical Impression  Pt presents to PT with generalized weakness and decreased balance and would benefit from acute PT services to address objective findings.  Pt lives alone but her daughter will be staying with her after discharge.  Pt very independent at baseline but this is her second hospitalization in one month and pt has not returned to baseline since first admission (end of Nov for pneumonia).  Recommend HHPT and 24/7 supervision for post acute follow up.     Follow Up Recommendations Home health PT;Supervision/Assistance - 24 hour    Equipment Recommendations  Rolling walker with 5" wheels    Recommendations for Other Services       Precautions / Restrictions Precautions Precautions: Fall Restrictions Weight Bearing Restrictions: No      Mobility  Bed Mobility Overal bed mobility: Modified Independent      General bed mobility comments: Rising easily to sitting, no c/o dizziness, able to manage sheets and blankets independently  Transfers Overall transfer level: Modified independent Equipment used: None      General transfer comment: Sit<>stand rising slowly with good balance and safety awareness, no c/o dizziness.  Ambulation/Gait Ambulation/Gait assistance: Supervision Ambulation Distance (Feet): 150 Feet(x2) Assistive device: None Gait Pattern/deviations: WFL(Within Functional Limits)     General Gait Details: Initially unsteady and wobbly, able to maintain balance without  physical assist, using IV pole or funriture for comfort.  Stairs     Wheelchair Mobility    Modified Rankin (Stroke Patients Only)       Balance Overall balance assessment: Modified Independent          Pertinent Vitals/Pain Pain Assessment: No/denies pain    Home Living Family/patient expects to be discharged to:: Private residence Living Arrangements: Alone Available Help at Discharge: Family(son nearby, helps with transportation) Type of Home: House Home Access: Stairs to enter Entrance Stairs-Rails: None(planning on getting rails installed) Entrance Stairs-Number of Steps: 2 Home Layout: One level Home Equipment: Cane - single point;Walker - standard      Prior Function Level of Independence: Independent         Comments: fully indpendent without assitive device     Hand Dominance   Dominant Hand: Right    Extremity/Trunk Assessment   Upper Extremity Assessment Upper Extremity Assessment: Overall WFL for tasks assessed    Lower Extremity Assessment Lower Extremity Assessment: Overall WFL for tasks assessed       Communication   Communication: No difficulties  Cognition Arousal/Alertness: Awake/alert Behavior During Therapy: WFL for tasks assessed/performed Overall Cognitive Status: Within Functional Limits for tasks assessed        General Comments General comments (skin integrity, edema, etc.): all visible areas intact    Exercises Other Exercises Other Exercises: Ambulation for 150' x 2    Assessment/Plan    PT Assessment Patient needs continued PT services  PT Problem List Decreased strength;Decreased activity tolerance;Decreased balance;Decreased mobility;Decreased knowledge of use of DME       PT Treatment Interventions DME instruction;Gait training;Functional mobility training;Therapeutic activities;Therapeutic exercise;Balance training;Patient/family education    PT Goals (Current  goals can be found in the Care Plan section)   Acute Rehab PT Goals Patient Stated Goal: To get stronger. PT Goal Formulation: With patient Time For Goal Achievement: 10/21/17 Potential to Achieve Goals: Good    Frequency Min 2X/week   Barriers to discharge Decreased caregiver support lives alone    Co-evaluation        AM-PAC PT "6 Clicks" Daily Activity  Outcome Measure Difficulty turning over in bed (including adjusting bedclothes, sheets and blankets)?: None Difficulty moving from lying on back to sitting on the side of the bed? : None Difficulty sitting down on and standing up from a chair with arms (e.g., wheelchair, bedside commode, etc,.)?: A Little Help needed moving to and from a bed to chair (including a wheelchair)?: None Help needed walking in hospital room?: A Little Help needed climbing 3-5 steps with a railing? : A Little 6 Click Score: 21    End of Session Equipment Utilized During Treatment: Gait belt Activity Tolerance: Patient tolerated treatment well Patient left: in bed;with call bell/phone within reach;with family/visitor present Nurse Communication: Mobility status PT Visit Diagnosis: Unsteadiness on feet (R26.81);Muscle weakness (generalized) (M62.81)    Time: [id number] PT Time Calculation (min) (ACUTE ONLY): 28 min   Charges:   PT Evaluation $PT Eval Low Complexity: 1 Low PT Treatments $Therapeutic Exercise: 8-22 mins   PT G Codes:   PT G-Codes **NOT FOR INPATIENT CLASS** Functional Assessment Tool Used: AM-PAC 6 Clicks Basic Mobility Functional Limitation: Mobility: Walking and moving around Mobility: Walking and Moving Around Current Status ([id number](G8978): At least 20 percent but less than 40 percent impaired, limited or restricted Mobility: Walking and Moving Around Goal Status [phone](G8979): At least 1 percent but less than 20 percent impaired, limited or restricted    Pulte Homesisele A Lanay Zinda, PT 10/14/2017, 3:28 PM

## 2017-10-15 LAB — RENAL FUNCTION PANEL
ANION GAP: 8 (ref 5–15)
Albumin: 2.8 g/dL — ABNORMAL LOW (ref 3.5–5.0)
BUN: 43 mg/dL — ABNORMAL HIGH (ref 6–20)
CALCIUM: 8.3 mg/dL — AB (ref 8.9–10.3)
CHLORIDE: 104 mmol/L (ref 101–111)
CO2: 25 mmol/L (ref 22–32)
Creatinine, Ser: 2.19 mg/dL — ABNORMAL HIGH (ref 0.44–1.00)
GFR calc Af Amer: 23 mL/min — ABNORMAL LOW (ref >60)
GFR calc non Af Amer: 19 mL/min — ABNORMAL LOW (ref >60)
GLUCOSE: 137 mg/dL — AB (ref 65–99)
POTASSIUM: 3.8 mmol/L (ref 3.5–5.1)
Phosphorus: 4.1 mg/dL (ref 2.5–4.6)
SODIUM: 137 mmol/L (ref 135–145)

## 2017-10-15 LAB — URINE CULTURE

## 2017-10-15 LAB — GLUCOSE, CAPILLARY: Glucose-Capillary: 134 mg/dL — ABNORMAL HIGH (ref 65–99)

## 2017-10-15 MED ORDER — CEFUROXIME AXETIL 500 MG PO TABS
500.0000 mg | ORAL_TABLET | Freq: Every day | ORAL | Status: DC
  Filled 2017-10-15: qty 1

## 2017-10-15 MED ORDER — CEFUROXIME AXETIL 500 MG PO TABS: 500.0000 mg | ORAL_TABLET | Freq: Two times a day (BID) | ORAL | Status: DC

## 2017-10-15 NOTE — Discharge Summary (Signed)
[facility] [place] at [facility], 83 y.o., DOB 13-May-1931, MRN [id number]. Admission date: 10/13/2017 Discharge Date 10/15/2017 Primary MD [name], MD Admitting Physician Enid Baas, MD  Admission Diagnosis  Acute cystitis with hematuria [N30.01] Nausea and vomiting, intractability of vomiting not specified, unspecified vomiting type [R11.2] Acute renal failure superimposed on chronic kidney disease, unspecified CKD stage, unspecified acute renal failure type (HCC) [N17.9, N18.9]  Discharge Diagnosis   Active Problems: Acute renal failure on chronic kidney disease stage III due to dehydration Acute cystitis Diabetes type 2 Hypertension Nausea vomiting unclear etiology if persists needs to have gastric emptying study: Now resolved      Hospital Course Currieis a86 y.o.femalewith multiple medical problems including CK D stage III with baseline creatinine of 1.5, diabetes, COPD not on home oxygen, hypertension, sick sinus syndrome status post pacemaker, arthritis presents to hospital secondary to extreme weakness and nausea and vomiting that day prior.  Patient was seen in the emergency room and was noted to have acute on chronic renal failure and was admitted for IV fluids.  She also was noted to have cystitis urine culture did not grow any bacteria.  She was treated with antibiotics she is doing much better she was seen by PT recommended home health which is currently being arranged.   Patient symptoms continue to persist her primary care provider needs to arrange for a gastric emptying study.           Consults  None  Significant Tests:  See full reports for all details     Dg Chest 2 View  Result Date: 10/13/2017 CLINICAL DATA:  Nausea, vomiting, generalized weakness EXAM: CHEST  2 VIEW COMPARISON:  09/19/2017 FINDINGS: Left pacer remains in place, unchanged. Low lung volumes with bibasilar atelectasis. Heart is  borderline in size. No effusions or acute bony abnormality. IMPRESSION: Low volumes, bibasilar atelectasis. Electronically Signed   By: Charlett Nose M.D.   On: 10/13/2017 19:54   Ct Head Wo Contrast  Result Date: 09/19/2017 CLINICAL DATA:  Unwitnessed fall today. Found down. Unexplained altered level consciousness. EXAM: CT HEAD WITHOUT CONTRAST TECHNIQUE: Contiguous axial images were obtained from the base of the skull through the vertex without intravenous contrast. COMPARISON:  None. FINDINGS: Brain: No evidence of acute infarction, hemorrhage, hydrocephalus, extra-axial collection, or mass lesion/mass effect. Mild diffuse cerebral atrophy and chronic small vessel disease. Vascular:  No hyperdense vessel or other acute findings. Skull: No evidence of fracture or other significant bone abnormality. Sinuses/Orbits: No acute findings. Mild chronic mucosal thickening involving bilateral ethmoid sinuses. Other: None. IMPRESSION: No acute intracranial abnormality. Mild cerebral atrophy and chronic small vessel disease. Electronically Signed   By: Myles Rosenthal M.D.   On: 09/19/2017 19:32   Dg Chest Port 1 View  Result Date: 09/19/2017 CLINICAL DATA:  Code sepsis.  Weakness and confusion. EXAM: PORTABLE CHEST 1 VIEW COMPARISON:  03/05/2016 FINDINGS: Stable cardiomegaly with aortic atherosclerosis. Left-sided pacemaker apparatus with right atrial and right ventricular leads. Surgical clips are seen at the GE juncture. Confluent opacity in the right upper lobe suspicious for pneumonia. No overt pulmonary edema. No effusion. No acute osseous abnormality. IMPRESSION: 1. Pneumonic consolidation in the right upper lobe. 2. Stable cardiomegaly with aortic atherosclerosis. Electronically Signed   By: Tollie Eth M.D.   On: 09/19/2017 19:16       Today   Subjective:   [name] patient feels well wants to go home  Objective:   Blood  pressure 136/62, pulse 68, temperature 98.1 F (36.7 C), temperature  source Oral, resp. rate 20, height 5\' 4"  (1.626 m), weight 169 lb (76.7 kg), SpO2 94 %.  .  Intake/Output Summary (Last 24 hours) at 10/15/2017 1602 Last data filed at 10/15/2017 1054 Gross per 24 hour  Intake 1409 ml  Output 500 ml  Net 909 ml    Exam VITAL SIGNS: Blood pressure 136/62, pulse 68, temperature 98.1 F (36.7 C), temperature source Oral, resp. rate 20, height 5\' 4"  (1.626 m), weight 169 lb (76.7 kg), SpO2 94 %.  GENERAL:  83 y.o.-year-old patient lying in the bed with no acute distress.  EYES: Pupils equal, round, reactive to light and accommodation. No scleral icterus. Extraocular muscles intact.  HEENT: Head atraumatic, normocephalic. Oropharynx and nasopharynx clear.  NECK:  Supple, no jugular venous distention. No thyroid enlargement, no tenderness.  LUNGS: Normal breath sounds bilaterally, no wheezing, rales,rhonchi or crepitation. No use of accessory muscles of respiration.  CARDIOVASCULAR: S1, S2 normal. No murmurs, rubs, or gallops.  ABDOMEN: Soft, nontender, nondistended. Bowel sounds present. No organomegaly or mass.  EXTREMITIES: No pedal edema, cyanosis, or clubbing.  NEUROLOGIC: Cranial nerves II through XII are intact. Muscle strength 5/5 in all extremities. Sensation intact. Gait not checked.  PSYCHIATRIC: The patient is alert and oriented x 3.  SKIN: No obvious rash, lesion, or ulcer.   Data Review     CBC w Diff:  Lab Results  Component Value Date   WBC 4.6 10/14/2017   HGB 11.3 (L) 10/14/2017   HGB 14.0 09/21/2012   HCT 34.9 (L) 10/14/2017   HCT 42.4 09/21/2012   PLT 147 (L) 10/14/2017   PLT 190 09/21/2012   LYMPHOPCT 15 10/13/2017   LYMPHOPCT 27.1 09/21/2012   MONOPCT 9 10/13/2017   MONOPCT 9.2 09/21/2012   EOSPCT 2 10/13/2017   EOSPCT 2.9 09/21/2012   BASOPCT 1 10/13/2017   BASOPCT 0.9 09/21/2012   CMP:  Lab Results  Component Value Date   NA 137 10/15/2017   NA 139 09/21/2012   K 3.8 10/15/2017   K 3.7 09/21/2012   CL 104  10/15/2017   CL 104 09/21/2012   CO2 25 10/15/2017   CO2 30 09/21/2012   BUN 43 (H) 10/15/2017   BUN 20 (H) 09/21/2012   CREATININE 2.19 (H) 10/15/2017   CREATININE 1.23 09/21/2012   PROT 7.1 10/13/2017   ALBUMIN 2.8 (L) 10/15/2017   BILITOT 0.6 10/13/2017   ALKPHOS 68 10/13/2017   AST 26 10/13/2017   ALT 26 10/13/2017  .  Micro Results Recent Results (from the past 240 hour(s))  Urine Culture     Status: Abnormal   Collection Time: 10/13/17  6:51 PM  Result Value Ref Range Status   Specimen Description URINE, RANDOM  Final   Special Requests NONE  Final   Culture MULTIPLE SPECIES PRESENT, SUGGEST RECOLLECTION (A)  Final   Report Status 10/15/2017 FINAL  Final     Code Status History    Date Active Date Inactive Code Status Order ID Comments User Context   10/13/2017 22:42 10/15/2017 16:00 Full Code [id number]  Enid BaasKalisetti, Radhika, MD Inpatient   09/19/2017 22:58 09/21/2017 17:02 Full Code [id number]  SalaryEvelena Asa, Montell D, MD Inpatient          Follow-up Information    Danella PentonMiller, Mark F, MD. Go on 10/26/2017.   Specialty:  Internal Medicine Why:  Monday at 10:45am for hoptial f/u Contact information: [address] [place] [address] [phone]  Discharge Medications   Allergies as of 10/15/2017      Reactions   Darvon [propoxyphene] Nausea And Vomiting   Hydrocodone Nausea And Vomiting   Janumet [sitagliptin-metformin Hcl] Nausea And Vomiting   Motrin [ibuprofen] Nausea And Vomiting   Tramadol Nausea And Vomiting   Can tolerate this medication in small doses (takes half a pill).      Medication List    STOP taking these medications   furosemide 20 MG tablet Commonly known as:  LASIX   pioglitazone 30 MG tablet Commonly known as:  ACTOS     TAKE these medications   acetaminophen 500 MG tablet Commonly known as:  TYLENOL Take 1,000 mg by mouth every 8 (eight) hours as needed.   aspirin EC 81 MG tablet Take 81 mg by mouth  daily.   atorvastatin 20 MG tablet Commonly known as:  LIPITOR Take 20 mg by mouth daily.   carvedilol 3.125 MG tablet Commonly known as:  COREG Take 3.125 mg by mouth 2 (two) times daily with a meal.   gabapentin 300 MG capsule Commonly known as:  NEURONTIN Take 1 capsule (300 mg total) by mouth 2 (two) times daily. What changed:  when to take this   glipiZIDE 5 MG 24 hr tablet Commonly known as:  GLUCOTROL XL Take 10 mg by mouth 2 (two) times daily.   isosorbide mononitrate 60 MG 24 hr tablet Commonly known as:  IMDUR Take 60 mg by mouth daily.   losartan 100 MG tablet Commonly known as:  COZAAR Take 100 mg by mouth daily.   meloxicam 7.5 MG tablet Commonly known as:  MOBIC Take 1 tablet by mouth daily.   pantoprazole 40 MG tablet Commonly known as:  PROTONIX Take 40 mg by mouth daily.   PRECISION QID TEST test strip Generic drug:  glucose blood Use 2 (two) times daily. True metrix meter. Diagnosis: E11.29   SYMBICORT 160-4.5 MCG/ACT inhaler Generic drug:  budesonide-formoterol Inhale 2 puffs into the lungs 2 (two) times daily as needed.          Total Time in preparing paper work, data evaluation and todays exam - 35 minutes  Auburn BilberryPATEL, Tailynn Armetta M.D on 10/15/2017 at 4:02 [employer] [employer]CM  [facility]   Office  [phone]

## 2017-10-15 NOTE — Progress Notes (Signed)
Discharge teaching given to patient, patient verbalized understanding and had no questions. Patient IV removed. Patient will be transported home by family. All patient belongings gathered prior to leaving. Patient will have a wheelchair delivered to room prior to leaving hospital.

## 2017-10-15 NOTE — Progress Notes (Signed)
Pharmacist-Provider Communication:  Order for ceftin 500 mg PO BID was changed to 500 mg PO daily given renal function and indication. Per protocol.  Cindi CarbonMary M Rejina Odle, PharmD, BCPS Clinical Pharmacist 10/15/17 11:57 AM

## 2017-10-15 NOTE — Care Management (Signed)
Patient admitted for acute cystitis.  Patient lives at home alone.  Adult children live locally for support.  Son provides transportation.  PCP Bethann PunchesMark Miller.  Patient open with [facility].  PT has assessed patient and recommends home health PT. Resumption orders have been placed.  Jermaine with [facility] notified of discharge.  RW to be delivered to room prior to discharge.  RNCM signing off.

## 2017-10-15 NOTE — Care Management Important Message (Signed)
Important Message  Patient Details  Name: [name] MRN: [id number] Date of Birth: 03/31/1931   Medicare Important Message Given:  N/A - LOS <3 / Initial given by admissions    Chapman FitchBOWEN, Aniesha Haughn T, RN 10/15/2017, 11:08 AM

## 2017-10-16 ENCOUNTER — Other Ambulatory Visit: Payer: Self-pay

## 2017-10-21 ENCOUNTER — Ambulatory Visit: Payer: Medicare Other | Admitting: Family Medicine

## 2017-10-23 ENCOUNTER — Ambulatory Visit (INDEPENDENT_AMBULATORY_CARE_PROVIDER_SITE_OTHER): Payer: Medicare Other | Admitting: Vascular Surgery

## 2017-12-02 ENCOUNTER — Ambulatory Visit
Admission: RE | Admit: 2017-12-02 | Discharge: 2017-12-02 | Disposition: A | Discharge status: Home / Self Care | Payer: Medicare Other | Source: Ambulatory Visit | Attending: Physician Assistant | Admitting: Physician Assistant

## 2017-12-02 ENCOUNTER — Other Ambulatory Visit: Payer: Self-pay | Admitting: Physician Assistant

## 2017-12-02 DIAGNOSIS — Z981 Arthrodesis status: Secondary | ICD-10-CM | POA: Insufficient documentation

## 2017-12-02 DIAGNOSIS — M545 Low back pain, unspecified: Secondary | ICD-10-CM

## 2017-12-02 DIAGNOSIS — M48061 Spinal stenosis, lumbar region without neurogenic claudication: Secondary | ICD-10-CM | POA: Diagnosis not present

## 2017-12-02 DIAGNOSIS — I700 Atherosclerosis of aorta: Secondary | ICD-10-CM | POA: Diagnosis not present

## 2017-12-02 DIAGNOSIS — M5126 Other intervertebral disc displacement, lumbar region: Secondary | ICD-10-CM | POA: Insufficient documentation

## 2018-01-07 ENCOUNTER — Encounter: Payer: Self-pay | Admitting: Emergency Medicine

## 2018-01-07 ENCOUNTER — Other Ambulatory Visit: Payer: Self-pay

## 2018-01-07 ENCOUNTER — Ambulatory Visit
Admission: EM | Admit: 2018-01-07 | Discharge: 2018-01-07 | Disposition: A | Discharge status: Home / Self Care | Payer: Medicare Other | Attending: Family Medicine | Admitting: Family Medicine

## 2018-01-07 ENCOUNTER — Ambulatory Visit
Admit: 2018-01-07 | Discharge: 2018-01-07 | Disposition: A | Discharge status: Home / Self Care | Payer: Medicare Other | Attending: Family Medicine | Admitting: Family Medicine

## 2018-01-07 ENCOUNTER — Ambulatory Visit: Payer: Medicare Other

## 2018-01-07 DIAGNOSIS — I341 Nonrheumatic mitral (valve) prolapse: Secondary | ICD-10-CM | POA: Diagnosis not present

## 2018-01-07 DIAGNOSIS — Z6827 Body mass index (BMI) 27.0-27.9, adult: Secondary | ICD-10-CM | POA: Diagnosis not present

## 2018-01-07 DIAGNOSIS — Z950 Presence of cardiac pacemaker: Secondary | ICD-10-CM | POA: Insufficient documentation

## 2018-01-07 DIAGNOSIS — Z7984 Long term (current) use of oral hypoglycemic drugs: Secondary | ICD-10-CM | POA: Diagnosis not present

## 2018-01-07 DIAGNOSIS — S0101XA Laceration without foreign body of scalp, initial encounter: Secondary | ICD-10-CM

## 2018-01-07 DIAGNOSIS — Z7982 Long term (current) use of aspirin: Secondary | ICD-10-CM | POA: Diagnosis not present

## 2018-01-07 DIAGNOSIS — E785 Hyperlipidemia, unspecified: Secondary | ICD-10-CM | POA: Insufficient documentation

## 2018-01-07 DIAGNOSIS — W19XXXA Unspecified fall, initial encounter: Secondary | ICD-10-CM | POA: Insufficient documentation

## 2018-01-07 DIAGNOSIS — E669 Obesity, unspecified: Secondary | ICD-10-CM | POA: Insufficient documentation

## 2018-01-07 DIAGNOSIS — Z79899 Other long term (current) drug therapy: Secondary | ICD-10-CM | POA: Diagnosis not present

## 2018-01-07 DIAGNOSIS — I509 Heart failure, unspecified: Secondary | ICD-10-CM | POA: Insufficient documentation

## 2018-01-07 DIAGNOSIS — E11319 Type 2 diabetes mellitus with unspecified diabetic retinopathy without macular edema: Secondary | ICD-10-CM | POA: Insufficient documentation

## 2018-01-07 DIAGNOSIS — J449 Chronic obstructive pulmonary disease, unspecified: Secondary | ICD-10-CM | POA: Diagnosis not present

## 2018-01-07 DIAGNOSIS — S61511A Laceration without foreign body of right wrist, initial encounter: Secondary | ICD-10-CM | POA: Diagnosis not present

## 2018-01-07 DIAGNOSIS — Z886 Allergy status to analgesic agent status: Secondary | ICD-10-CM | POA: Diagnosis not present

## 2018-01-07 DIAGNOSIS — Z888 Allergy status to other drugs, medicaments and biological substances status: Secondary | ICD-10-CM | POA: Diagnosis not present

## 2018-01-07 DIAGNOSIS — Z885 Allergy status to narcotic agent status: Secondary | ICD-10-CM | POA: Insufficient documentation

## 2018-01-07 DIAGNOSIS — E1122 Type 2 diabetes mellitus with diabetic chronic kidney disease: Secondary | ICD-10-CM | POA: Insufficient documentation

## 2018-01-07 DIAGNOSIS — S0990XA Unspecified injury of head, initial encounter: Secondary | ICD-10-CM

## 2018-01-07 DIAGNOSIS — W01198A Fall on same level from slipping, tripping and stumbling with subsequent striking against other object, initial encounter: Secondary | ICD-10-CM | POA: Diagnosis not present

## 2018-01-07 DIAGNOSIS — I130 Hypertensive heart and chronic kidney disease with heart failure and stage 1 through stage 4 chronic kidney disease, or unspecified chronic kidney disease: Secondary | ICD-10-CM | POA: Insufficient documentation

## 2018-01-07 DIAGNOSIS — Y92009 Unspecified place in unspecified non-institutional (private) residence as the place of occurrence of the external cause: Secondary | ICD-10-CM

## 2018-01-07 DIAGNOSIS — N183 Chronic kidney disease, stage 3 (moderate): Secondary | ICD-10-CM | POA: Diagnosis not present

## 2018-01-07 NOTE — ED Triage Notes (Signed)
Patient states she fell last night and injured her right wrist and hit her head.

## 2018-01-07 NOTE — Discharge Instructions (Signed)
Rest.  Tylenol as needed.   Staples out in 7 days.  Take care  Dr. Stacyann Mcconaughy  

## 2018-01-07 NOTE — ED Provider Notes (Signed)
[facility]    CSN: [id number] Arrival date & time: 01/07/18  1258  History   Chief Complaint Chief Complaint  Patient presents with  . Fall   HPI  84 year old female presents for evaluation after suffering a fall last night.  Patient states that she was outside yesterday evening.  She states that it was before dark.  She states that she fell and hit her head on a table.  She states that she injured her right wrist in the process as well.  She apparently suffered a scalp laceration as well as a skin tear (right wrist).  Patient states that she is unsure of exactly how she fell.  She denies loss of consciousness.  Patient states that she did not tell her family members.  Patient states that she cleaned her right wrist wound thoroughly with soap and water.  She states that she applied ice to her head and bleeding subsequently stopped.  Patient states that she is feeling well but wanted to be evaluated.  No other reported symptoms.  No other complaints or concerns at this time.  Past Medical History:  Diagnosis Date  . Cervical disc disease   . Chronic kidney disease    stage 3  . COPD (chronic obstructive pulmonary disease) (HCC)   . Cystitis   . Degenerative joint disease   . Diabetes mellitus without complication (HCC)    DM type 2  . Diabetic retinopathy associated with type 2 diabetes mellitus, without macular edema   . Diverticulosis of colon    without mention of hemorrhage  . Gastric ulcer   . GERD (gastroesophageal reflux disease)   . Hyperlipemia   . Hypertension   . Mitral valve prolapse   . Premature atrial contraction   . Sick sinus syndrome (HCC)   . Sinoatrial node dysfunction [facility](HCC)    Patient Active Problem List   Diagnosis Date Noted  . History of peptic ulcer disease 09/16/2017  . Gait instability 09/16/2017  . Obesity (BMI 30-39.9) 03/10/2017  . Trochanteric bursitis of left hip 03/10/2017  . CKD (chronic kidney disease) stage 3, GFR 30-59  ml/min (HCC) 12/31/2016  . Moderate tricuspid insufficiency 12/31/2016  . B12 deficiency 05/20/2016  . Moderate mitral insufficiency 12/25/2015  . Bilateral carotid artery stenosis 11/27/2015  . Chronic venous insufficiency 10/09/2015  . LVH (left ventricular hypertrophy) due to hypertensive disease, with heart failure (HCC) 10/09/2015  . Controlled type 2 diabetes mellitus with stage 3 chronic kidney disease (HCC) 04/06/2015  . Edema leg 04/05/2015  . Hypertensive left ventricular hypertrophy 04/05/2015  . DDD (degenerative disc disease), lumbar 12/13/2014  . Neuritis or radiculitis due to rupture of lumbar intervertebral disc 12/13/2014  . Hyperlipidemia due to type 2 diabetes mellitus (HCC) 08/15/2014  . COPD, mild (HCC) 08/14/2014  . Incomplete bladder emptying 07/05/2014  . Neuralgia neuritis, sciatic nerve 07/05/2014  . Urge incontinence 07/05/2014  . FOM (frequency of micturition) 07/05/2014  . Benign essential HTN 06/14/2014  . Lumbar canal stenosis 06/14/2014  . SA node dysfunction (HCC) 06/14/2014  . Hypertension associated with diabetes (HCC) 06/14/2014    Past Surgical History:  Procedure Laterality Date  . ABDOMINAL HYSTERECTOMY    . BACK SURGERY     lumbar back surgery  . COLONOSCOPY    . ESOPHAGOGASTRODUODENOSCOPY    . gastric ulcer surgery    . KIDNEY SURGERY    . pacemaker     insertion dual chamber pacemaker generator  . PARATHYROIDECTOMY    . THYROIDECTOMY,  PARTIAL     lobectomy partial thyroid    OB History    No data available       Home Medications    Prior to Admission medications   Medication Sig Start Date End Date Taking? Authorizing Provider  acetaminophen (TYLENOL) 500 MG tablet Take 1,000 mg by mouth every 8 (eight) hours as needed.   Yes [provider]  aspirin EC 81 MG tablet Take 81 mg by mouth daily.   Yes [provider]  atorvastatin (LIPITOR) 20 MG tablet Take 20 mg by mouth daily.   Yes [provider]  budesonide-formoterol (SYMBICORT) 160-4.5 MCG/ACT inhaler Inhale 2 puffs into the lungs 2 (two) times daily as needed.  07/17/17  Yes [provider]  carvedilol (COREG) 3.125 MG tablet Take 3.125 mg by mouth 2 (two) times daily with a meal.    Yes [provider]  gabapentin (NEURONTIN) 300 MG capsule Take 1 capsule (300 mg total) by mouth 2 (two) times daily. Patient taking differently: Take 300 mg by mouth daily.  09/16/17  Yes Plonk, Chrissie Noa, MD  glipiZIDE (GLUCOTROL XL) 5 MG 24 hr tablet Take 10 mg by mouth 2 (two) times daily.    Yes [provider]  isosorbide mononitrate (IMDUR) 60 MG 24 hr tablet Take 60 mg by mouth daily.    Yes [provider]  losartan (COZAAR) 100 MG tablet Take 100 mg by mouth daily.  03/20/17 03/20/18 Yes [provider]  meloxicam (MOBIC) 7.5 MG tablet Take 1 tablet by mouth daily. 09/11/17  Yes [provider]  pantoprazole (PROTONIX) 40 MG tablet Take 40 mg by mouth daily.   Yes [provider]  glucose blood (PRECISION QID TEST) test strip Use 2 (two) times daily. True metrix meter. Diagnosis: E11.29 06/01/17 06/01/18  [provider]    Family History Family History  Problem Relation Age of Onset  . Breast cancer Mother 54  . Breast cancer Other   . Breast cancer Maternal Aunt        2 mat aunts    Social History Social History   Tobacco Use  . Smoking status: Never Smoker  . Smokeless tobacco: Never Used  Substance Use Topics  . Alcohol use: No    Alcohol/week: 0.0 oz  . Drug use: No     Allergies   Darvon [propoxyphene]; Hydrocodone; Janumet [sitagliptin-metformin hcl]; Motrin [ibuprofen]; and Tramadol   Review of Systems Review of Systems  HENT:       Head injury.  Musculoskeletal:       Right wrist pain.  Skin: Positive for wound.  Neurological: Negative.   All other systems reviewed and are negative.  Physical Exam Triage Vital Signs ED Triage Vitals  Enc  Vitals Group     BP 01/07/18 1332 126/68     Pulse Rate 01/07/18 1332 67     Resp 01/07/18 1332 16     Temp 01/07/18 1332 99 F (37.2 C)     Temp Source 01/07/18 1332 Oral     SpO2 01/07/18 1332 97 %     Weight 01/07/18 1328 163 lb (73.9 kg)     Height 01/07/18 1328 5\' 4"  (1.626 m)     Head Circumference --      Peak Flow --      Pain Score 01/07/18 1328 0     Pain Loc --      Pain Edu? --      Excl. in GC? --  Updated Vital Signs BP 126/68 (BP Location: Left Arm)   Pulse 67   Temp 99 F (37.2 C) (Oral)   Resp 16   Ht 5\' 4"  (1.626 m)   Wt 163 lb (73.9 kg)   SpO2 97%   BMI 27.98 kg/m   Physical Exam  Constitutional: She appears well-developed. No distress.  HENT:  Nose: Nose normal.  Patient with evidence of a hematoma as well as a scalp laceration.  Eyes: Conjunctivae are normal. Right eye exhibits no discharge. Left eye exhibits no discharge.  Neck: Normal range of motion. Neck supple.  Cardiovascular: Normal rate and regular rhythm.  Murmur heard. Pulmonary/Chest: Effort normal and breath sounds normal. She has no wheezes. She has no rales.  Musculoskeletal:  Patient with tenderness in the anatomic snuffbox of the right wrist.  Normal range of motion.  Neurological:  Patient alert and oriented x3.  No apparent cranial nerve deficits.  Answers all questions properly.  Skin:  Approximately 2 cm linear laceration noted of the posterior scalp. Skin tear to the right wrist.  Psychiatric: She has a normal mood and affect. Her behavior is normal.  Nursing note and vitals reviewed.  UC Treatments / Results  Labs (all labs ordered are listed, but only abnormal results are displayed) Labs Reviewed - No data to display  EKG  EKG Interpretation None       Radiology Dg Wrist Complete Right  Result Date: 01/07/2018 CLINICAL DATA:  Right wrist injury after fall last night. EXAM: RIGHT WRIST - COMPLETE 3+ VIEW COMPARISON:  None. FINDINGS: No acute fracture or  dislocation. Diffuse osteopenia. Mild osteoarthritis of the first MCP joint. Soft tissues are unremarkable. IMPRESSION: Negative. Electronically Signed   By: Obie Dredge M.D.   On: 01/07/2018 14:41   Ct Head Wo Contrast  Result Date: 01/07/2018 CLINICAL DATA:  Status post fall yesterday evening with a blow to the head. Headache. EXAM: CT HEAD WITHOUT CONTRAST TECHNIQUE: Contiguous axial images were obtained from the base of the skull through the vertex without intravenous contrast. COMPARISON:  Head CT scan 09/19/2017. FINDINGS: Brain: No evidence of acute infarction, hemorrhage, hydrocephalus, extra-axial collection or mass lesion/mass effect. There is some cortical atrophy and chronic microvascular ischemic change. Vascular: Atherosclerosis noted. Skull: Intact. Sinuses/Orbits: Clear. Other: Scalp contusion near the vertex on the right is identified. Staples for laceration suturing noted. IMPRESSION: No acute intracranial abnormality.  No fracture. Scalp laceration on the right near the vertex. Atrophy and chronic microvascular ischemic change. Atherosclerosis. Electronically Signed   By: Drusilla Kanner M.D.   On: 01/07/2018 14:50    Procedures Laceration Repair Date/Time: 01/07/2018 2:30 PM Performed by: Tommie Sams, DO Authorized by: Tommie Sams, DO   Consent:    Consent obtained:  Verbal   Consent given by:  Patient Anesthesia (see MAR for exact dosages):    Anesthesia method:  Local infiltration   Local anesthetic:  Lidocaine 1% WITH epi Laceration details:    Location:  Scalp   Scalp location:  Crown   Length (cm):  2 Repair type:    Repair type:  Simple Pre-procedure details:    Preparation:  Patient was prepped and draped in usual sterile fashion Treatment:    Area cleansed with:  Saline Skin repair:    Repair method:  Staples   Number of staples:  3 Approximation:    Approximation:  Close   Vermilion border: well-aligned   Post-procedure details:    Patient  tolerance of procedure:  Tolerated well, no immediate complications    (including critical care time)  Medications Ordered in UC Medications - No data to display   Initial Impression / Assessment and Plan / UC Course  I have reviewed the triage vital signs and the nursing notes.  Pertinent labs & imaging results that were available during my care of the patient were reviewed by me and considered in my medical decision making (see chart for details).    84 year old female resents with a head injury and scalp laceration following a fall.  She also injured her right wrist.  Laceration repaired as above.  CT head was negative.  X-ray of the wrist was negative as well.  Tylenol as needed for pain.  Rest.  Needs close follow-up with primary care physician.  Staple removal in 7 days.  Final Clinical Impressions(s) / UC Diagnoses   Final diagnoses:  Fall in home, initial encounter  Injury of head, initial encounter    ED Discharge Orders    None     Controlled Substance Prescriptions [place] Controlled Substance Registry consulted? Not Applicable   Tommie Sams, DO 01/07/18 1501

## 2018-01-18 ENCOUNTER — Encounter: Payer: Self-pay | Admitting: Emergency Medicine

## 2018-01-18 ENCOUNTER — Other Ambulatory Visit: Payer: Self-pay

## 2018-01-18 ENCOUNTER — Ambulatory Visit
Admission: EM | Admit: 2018-01-18 | Discharge: 2018-01-18 | Disposition: A | Discharge status: Home / Self Care | Payer: Medicare Other | Attending: Family Medicine | Admitting: Family Medicine

## 2018-01-18 DIAGNOSIS — M545 Low back pain: Secondary | ICD-10-CM

## 2018-01-18 DIAGNOSIS — Z4802 Encounter for removal of sutures: Secondary | ICD-10-CM | POA: Diagnosis not present

## 2018-01-18 DIAGNOSIS — M533 Sacrococcygeal disorders, not elsewhere classified: Secondary | ICD-10-CM

## 2018-01-18 NOTE — ED Triage Notes (Signed)
Patient c/o back pain that started yesterday.  Patient denies injury or fall.  Patient also here for staple removal from her scalp.

## 2018-01-18 NOTE — Discharge Instructions (Signed)
Continue tylenol.   Stop the mobic.  Follow up with your neurosurgeon in [place].  Take care  Dr. Adriana Simasook

## 2018-01-18 NOTE — ED Provider Notes (Signed)
[facility]    CSN: [id number] Arrival date & time: 01/18/18  1053  History   Chief Complaint Chief Complaint  Patient presents with  . Back Pain  . Suture / Staple Removal   HPI  84 year old female presents for staple removal.  Patient also complains of low back pain.  Patient recently had a scalp laceration which I repaired.  She has had no issues with her wound.  She presents for suture removal today.  Additionally, patient has long-standing history of chronic back pain.  She reports that she had severe low back pain in the sacral region since yesterday.  She said no improvement with Tylenol or meloxicam.  She sees a neurosurgeon who gives her injections.  She is due to see him in a few days.  No known exacerbating relieving factors.  No other complaints or concerns at this time.  Past Medical History:  Diagnosis Date  . Cervical disc disease   . Chronic kidney disease    stage 3  . COPD (chronic obstructive pulmonary disease) (HCC)   . Cystitis   . Degenerative joint disease   . Diabetes mellitus without complication (HCC)    DM type 2  . Diabetic retinopathy associated with type 2 diabetes mellitus, without macular edema   . Diverticulosis of colon    without mention of hemorrhage  . Gastric ulcer   . GERD (gastroesophageal reflux disease)   . Hyperlipemia   . Hypertension   . Mitral valve prolapse   . Premature atrial contraction   . Sick sinus syndrome (HCC)   . Sinoatrial node dysfunction [facility])     Patient Active Problem List   Diagnosis Date Noted  . History of peptic ulcer disease 09/16/2017  . Gait instability 09/16/2017  . Obesity (BMI 30-39.9) 03/10/2017  . Trochanteric bursitis of left hip 03/10/2017  . CKD (chronic kidney disease) stage 3, GFR 30-59 ml/min (HCC) 12/31/2016  . Moderate tricuspid insufficiency 12/31/2016  . B12 deficiency 05/20/2016  . Moderate mitral insufficiency 12/25/2015  . Bilateral carotid artery stenosis  11/27/2015  . Chronic venous insufficiency 10/09/2015  . LVH (left ventricular hypertrophy) due to hypertensive disease, with heart failure (HCC) 10/09/2015  . Controlled type 2 diabetes mellitus with stage 3 chronic kidney disease (HCC) 04/06/2015  . Edema leg 04/05/2015  . Hypertensive left ventricular hypertrophy 04/05/2015  . DDD (degenerative disc disease), lumbar 12/13/2014  . Neuritis or radiculitis due to rupture of lumbar intervertebral disc 12/13/2014  . Hyperlipidemia due to type 2 diabetes mellitus (HCC) 08/15/2014  . COPD, mild (HCC) 08/14/2014  . Incomplete bladder emptying 07/05/2014  . Neuralgia neuritis, sciatic nerve 07/05/2014  . Urge incontinence 07/05/2014  . FOM (frequency of micturition) 07/05/2014  . Benign essential HTN 06/14/2014  . Lumbar canal stenosis 06/14/2014  . SA node dysfunction (HCC) 06/14/2014  . Hypertension associated with diabetes (HCC) 06/14/2014    Past Surgical History:  Procedure Laterality Date  . ABDOMINAL HYSTERECTOMY    . BACK SURGERY     lumbar back surgery  . COLONOSCOPY    . ESOPHAGOGASTRODUODENOSCOPY    . gastric ulcer surgery    . KIDNEY SURGERY    . pacemaker     insertion dual chamber pacemaker generator  . PARATHYROIDECTOMY    . THYROIDECTOMY, PARTIAL     lobectomy partial thyroid    OB History   None      Home Medications    Prior to Admission medications   Medication Sig Start Date End  Date Taking? Authorizing Provider  acetaminophen (TYLENOL) 500 MG tablet Take 1,000 mg by mouth every 8 (eight) hours as needed.   Yes [provider]  aspirin EC 81 MG tablet Take 81 mg by mouth daily.   Yes [provider]  atorvastatin (LIPITOR) 20 MG tablet Take 20 mg by mouth daily.   Yes [provider]  budesonide-formoterol (SYMBICORT) 160-4.5 MCG/ACT inhaler Inhale 2 puffs into the lungs 2 (two) times daily as needed.  07/17/17  Yes [provider]  gabapentin (NEURONTIN) 300 MG  capsule Take 1 capsule (300 mg total) by mouth 2 (two) times daily. Patient taking differently: Take 300 mg by mouth daily.  09/16/17  Yes Plonk, Chrissie NoaWilliam, MD  glipiZIDE (GLUCOTROL XL) 5 MG 24 hr tablet Take 10 mg by mouth 2 (two) times daily.    Yes [provider]  glucose blood (PRECISION QID TEST) test strip Use 2 (two) times daily. True metrix meter. Diagnosis: E11.29 06/01/17 06/01/18 Yes [provider]  isosorbide mononitrate (IMDUR) 60 MG 24 hr tablet Take 60 mg by mouth daily.    Yes [provider]  losartan (COZAAR) 100 MG tablet Take 100 mg by mouth daily.  03/20/17 03/20/18 Yes [provider]  pantoprazole (PROTONIX) 40 MG tablet Take 40 mg by mouth daily.   Yes [provider]  carvedilol (COREG) 3.125 MG tablet Take 3.125 mg by mouth 2 (two) times daily with a meal.     [provider]    Family History Family History  Problem Relation Age of Onset  . Breast cancer Mother 1482  . Breast cancer Other   . Breast cancer Maternal Aunt        2 mat aunts    Social History Social History   Tobacco Use  . Smoking status: Never Smoker  . Smokeless tobacco: Never Used  Substance Use Topics  . Alcohol use: No    Alcohol/week: 0.0 oz  . Drug use: No     Allergies   Darvon [propoxyphene]; Hydrocodone; Janumet [sitagliptin-metformin hcl]; Motrin [ibuprofen]; and Tramadol   Review of Systems Review of Systems  Musculoskeletal: Positive for back pain.  Skin: Positive for wound.   Physical Exam Triage Vital Signs ED Triage Vitals  Enc Vitals Group     BP 01/18/18 1131 (!) 153/78     Pulse Rate 01/18/18 1131 71     Resp 01/18/18 1131 16     Temp 01/18/18 1131 99.3 F (37.4 C)     Temp Source 01/18/18 1131 Oral     SpO2 01/18/18 1131 100 %     Weight 01/18/18 1129 166 lb (75.3 kg)     Height 01/18/18 1129 5\' 4"  (1.626 m)     Head Circumference --      Peak Flow --      Pain Score 01/18/18 1129 10     Pain Loc --       Pain Edu? --      Excl. in GC? --    Updated Vital Signs BP (!) 153/78 (BP Location: Left Arm)   Pulse 71   Temp 99.3 F (37.4 C) (Oral)   Resp 16   Ht 5\' 4"  (1.626 m)   Wt 166 lb (75.3 kg)   SpO2 100%   BMI 28.49 kg/m   Physical Exam  Constitutional: She is oriented to person, place, and time. She appears well-developed. No distress.  Cardiovascular: Normal rate and regular rhythm.  Pulmonary/Chest: Effort normal and  breath sounds normal.  Neurological: She is alert and oriented to person, place, and time.  Skin:  Laceration well-healed. No bleeding and drainage.  Psychiatric: She has a normal mood and affect. Her behavior is normal.  Nursing note and vitals reviewed.  UC Treatments / Results  Labs (all labs ordered are listed, but only abnormal results are displayed) Labs Reviewed - No data to display  EKG None Radiology No results found.  Procedures Procedures (including critical care time) 3 Staples removed in standard fashion without difficulty today.  Medications Ordered in UC Medications - No data to display   Initial Impression / Assessment and Plan / UC Course  I have reviewed the triage vital signs and the nursing notes.  Pertinent labs & imaging results that were available during my care of the patient were reviewed by me and considered in my medical decision making (see chart for details).    83 year old female presents for staple removal.  Staples removed without difficulty.  Wound is well-healed.  Patient complaining of low back pain.  This is chronic.  I advised continued use of Tylenol.  She should not use NSAIDs.  She also has allergies to pain medications.  I advised her to follow-up with her neurosurgeon.  She is in agreement.  Final Clinical Impressions(s) / UC Diagnoses   Final diagnoses:  Removal of staples  Sacral pain    ED Discharge Orders    None     Controlled Substance Prescriptions [place] Controlled Substance Registry consulted?  Not Applicable   Tommie Sams, DO 01/18/18 1234

## 2018-03-30 ENCOUNTER — Other Ambulatory Visit: Payer: Self-pay | Admitting: Neurosurgery

## 2018-03-30 ENCOUNTER — Ambulatory Visit
Admission: RE | Admit: 2018-03-30 | Discharge: 2018-03-30 | Disposition: A | Discharge status: Home / Self Care | Payer: Medicare Other | Source: Ambulatory Visit | Attending: Neurosurgery | Admitting: Neurosurgery

## 2018-03-30 DIAGNOSIS — M5418 Radiculopathy, sacral and sacrococcygeal region: Secondary | ICD-10-CM

## 2018-04-01 ENCOUNTER — Encounter: Payer: Self-pay | Admitting: Emergency Medicine

## 2018-04-01 ENCOUNTER — Other Ambulatory Visit: Payer: Self-pay

## 2018-04-01 ENCOUNTER — Ambulatory Visit
Admission: EM | Admit: 2018-04-01 | Discharge: 2018-04-01 | Disposition: A | Discharge status: Home / Self Care | Payer: Medicare Other | Attending: Family Medicine | Admitting: Family Medicine

## 2018-04-01 DIAGNOSIS — Z7982 Long term (current) use of aspirin: Secondary | ICD-10-CM | POA: Insufficient documentation

## 2018-04-01 DIAGNOSIS — Z79899 Other long term (current) drug therapy: Secondary | ICD-10-CM | POA: Diagnosis not present

## 2018-04-01 DIAGNOSIS — R739 Hyperglycemia, unspecified: Secondary | ICD-10-CM | POA: Diagnosis not present

## 2018-04-01 DIAGNOSIS — R300 Dysuria: Secondary | ICD-10-CM

## 2018-04-01 DIAGNOSIS — B373 Candidiasis of vulva and vagina: Secondary | ICD-10-CM | POA: Diagnosis not present

## 2018-04-01 DIAGNOSIS — E1165 Type 2 diabetes mellitus with hyperglycemia: Secondary | ICD-10-CM | POA: Diagnosis not present

## 2018-04-01 DIAGNOSIS — R7309 Other abnormal glucose: Secondary | ICD-10-CM

## 2018-04-01 DIAGNOSIS — B3731 Acute candidiasis of vulva and vagina: Secondary | ICD-10-CM

## 2018-04-01 LAB — BASIC METABOLIC PANEL
Anion gap: 11 (ref 5–15)
BUN: 18 mg/dL (ref 6–20)
CHLORIDE: 99 mmol/L — AB (ref 101–111)
CO2: 24 mmol/L (ref 22–32)
CREATININE: 1.41 mg/dL — AB (ref 0.44–1.00)
Calcium: 9.2 mg/dL (ref 8.9–10.3)
GFR calc Af Amer: 38 mL/min — ABNORMAL LOW (ref >60)
GFR calc non Af Amer: 33 mL/min — ABNORMAL LOW (ref >60)
Glucose, Bld: 464 mg/dL — ABNORMAL HIGH (ref 65–99)
POTASSIUM: 3.9 mmol/L (ref 3.5–5.1)
Sodium: 134 mmol/L — ABNORMAL LOW (ref 135–145)

## 2018-04-01 LAB — URINALYSIS, COMPLETE (UACMP) WITH MICROSCOPIC
Bilirubin Urine: NEGATIVE
GLUCOSE, UA: 500 mg/dL — AB
NITRITE: NEGATIVE
PH: 5 (ref 5.0–8.0)
PROTEIN: 100 mg/dL — AB
SPECIFIC GRAVITY, URINE: 1.015 (ref 1.005–1.030)

## 2018-04-01 LAB — CBC WITH DIFFERENTIAL/PLATELET
Basophils Absolute: 0 10*3/uL (ref 0–0.1)
Basophils Relative: 1 %
Eosinophils Absolute: 0 10*3/uL (ref 0–0.7)
Eosinophils Relative: 1 %
HEMATOCRIT: 41.1 % (ref 35.0–47.0)
HEMOGLOBIN: 13.3 g/dL (ref 12.0–16.0)
LYMPHS ABS: 0.8 10*3/uL — AB (ref 1.0–3.6)
Lymphocytes Relative: 20 %
MCH: 28.4 pg (ref 26.0–34.0)
MCHC: 32.5 g/dL (ref 32.0–36.0)
MCV: 87.4 fL (ref 80.0–100.0)
MONOS PCT: 9 %
Monocytes Absolute: 0.4 10*3/uL (ref 0.2–0.9)
NEUTROS ABS: 2.9 10*3/uL (ref 1.4–6.5)
NEUTROS PCT: 69 %
Platelets: 140 10*3/uL — ABNORMAL LOW (ref 150–440)
RBC: 4.7 MIL/uL (ref 3.80–5.20)
RDW: 15.6 % — ABNORMAL HIGH (ref 11.5–14.5)
WBC: 4.2 10*3/uL (ref 3.6–11.0)

## 2018-04-01 LAB — GLUCOSE, CAPILLARY: Glucose-Capillary: 467 mg/dL — ABNORMAL HIGH (ref 65–99)

## 2018-04-01 MED ORDER — CEPHALEXIN 250 MG PO CAPS: 250.0000 mg | ORAL_CAPSULE | Freq: Three times a day (TID) | ORAL | 0 refills | Status: AC

## 2018-04-01 MED ORDER — FLUCONAZOLE 150 MG PO TABS: 150.0000 mg | ORAL_TABLET | Freq: Once | ORAL | 0 refills | Status: AC

## 2018-04-01 NOTE — ED Provider Notes (Signed)
[facility] ____________________________________________  Time seen: Approximately 3:48 PM  I have reviewed the triage vital signs and the nursing notes.   HISTORY  Chief Complaint Dysuria  HPI [name] is a 84 y.o. female past medical history of COPD, diabetes, hypertension, chronic kidney disease presenting for evaluation of dysuria.  Patient states that she has had intermittent burning with urination off and on for 3 to 4 weeks.  States over the last several days she has had more burning with urination.  States continues to eat and drink well, but states that she does not drink enough water and drinks more sodas.  Denies any vaginal irritation, redness or discharge.  Denies any associated abdominal pain.  Does report urinary frequency.  States that she has been dealing with chronic low back pain, denies any acute changes.  No flank pain.  No fevers.  Denies vomiting or diarrhea.  Continues to have normal bowel movements.  Reports that she has had a urinary tract infection in the past with similar presentation.  Reports otherwise feels well.  Denies weakness.  Patient states "I feel fine it just burns ".  States some vaginal itching. Denies vaginal exam. Denies chest pain, shortness of breath, abdominal pain,  or rash. Denies recent sickness. Denies recent antibiotic use.   [name], MD: PCP   Past Medical History:  Diagnosis Date  . Cervical disc disease   . Chronic kidney disease    stage 3  . COPD (chronic obstructive pulmonary disease) (HCC)   . Cystitis   . Degenerative joint disease   . Diabetes mellitus without complication (HCC)    DM type 2  . Diabetic retinopathy associated with type 2 diabetes mellitus, without macular edema   . Diverticulosis of colon    without mention of hemorrhage  . Gastric ulcer   . GERD (gastroesophageal reflux disease)   . Hyperlipemia   . Hypertension   . Mitral valve prolapse   . Premature atrial contraction     . Sick sinus syndrome (HCC)   . Sinoatrial node dysfunction [facility])     Patient Active Problem List   Diagnosis Date Noted  . History of peptic ulcer disease 09/16/2017  . Gait instability 09/16/2017  . Obesity (BMI 30-39.9) 03/10/2017  . Trochanteric bursitis of left hip 03/10/2017  . CKD (chronic kidney disease) stage 3, GFR 30-59 ml/min (HCC) 12/31/2016  . Moderate tricuspid insufficiency 12/31/2016  . B12 deficiency 05/20/2016  . Moderate mitral insufficiency 12/25/2015  . Bilateral carotid artery stenosis 11/27/2015  . Chronic venous insufficiency 10/09/2015  . LVH (left ventricular hypertrophy) due to hypertensive disease, with heart failure (HCC) 10/09/2015  . Controlled type 2 diabetes mellitus with stage 3 chronic kidney disease (HCC) 04/06/2015  . Edema leg 04/05/2015  . Hypertensive left ventricular hypertrophy 04/05/2015  . DDD (degenerative disc disease), lumbar 12/13/2014  . Neuritis or radiculitis due to rupture of lumbar intervertebral disc 12/13/2014  . Hyperlipidemia due to type 2 diabetes mellitus (HCC) 08/15/2014  . COPD, mild (HCC) 08/14/2014  . Incomplete bladder emptying 07/05/2014  . Neuralgia neuritis, sciatic nerve 07/05/2014  . Urge incontinence 07/05/2014  . FOM (frequency of micturition) 07/05/2014  . Benign essential HTN 06/14/2014  . Lumbar canal stenosis 06/14/2014  . SA node dysfunction (HCC) 06/14/2014  . Hypertension associated with diabetes (HCC) 06/14/2014    Past Surgical History:  Procedure Laterality Date  . ABDOMINAL HYSTERECTOMY    . BACK SURGERY     lumbar back surgery  .  COLONOSCOPY    . ESOPHAGOGASTRODUODENOSCOPY    . gastric ulcer surgery    . KIDNEY SURGERY    . pacemaker     insertion dual chamber pacemaker generator  . PARATHYROIDECTOMY    . THYROIDECTOMY, PARTIAL     lobectomy partial thyroid     No current facility-administered medications for this encounter.   Current Outpatient Medications:  .  aspirin EC 81 MG  tablet, Take 81 mg by mouth daily., Disp: , Rfl:  .  atorvastatin (LIPITOR) 20 MG tablet, Take 20 mg by mouth daily., Disp: , Rfl:  .  budesonide-formoterol (SYMBICORT) 160-4.5 MCG/ACT inhaler, Inhale 2 puffs into the lungs 2 (two) times daily as needed. , Disp: , Rfl:  .  carvedilol (COREG) 3.125 MG tablet, Take 3.125 mg by mouth 2 (two) times daily with a meal. , Disp: , Rfl:  .  gabapentin (NEURONTIN) 300 MG capsule, Take 1 capsule (300 mg total) by mouth 2 (two) times daily. (Patient taking differently: Take 300 mg by mouth daily. ), Disp: 60 capsule, Rfl: 2 .  glipiZIDE (GLUCOTROL XL) 5 MG 24 hr tablet, Take 10 mg by mouth 2 (two) times daily. , Disp: , Rfl:  .  isosorbide mononitrate (IMDUR) 60 MG 24 hr tablet, Take 60 mg by mouth daily. , Disp: , Rfl:  .  losartan (COZAAR) 100 MG tablet, Take 100 mg by mouth daily. , Disp: , Rfl:  .  pantoprazole (PROTONIX) 40 MG tablet, Take 40 mg by mouth daily., Disp: , Rfl:  .  cephALEXin (KEFLEX) 250 MG capsule, Take 1 capsule (250 mg total) by mouth 3 (three) times daily for 7 days., Disp: 21 capsule, Rfl: 0 .  fluconazole (DIFLUCAN) 150 MG tablet, Take 1 tablet (150 mg total) by mouth once for 1 dose. Take one pill orally, then Repeat in one week as needed., Disp: 1 tablet, Rfl: 0 .  glucose blood (PRECISION QID TEST) test strip, Use 2 (two) times daily. True metrix meter. Diagnosis: E11.29, Disp: , Rfl:   Allergies Darvon [propoxyphene]; Hydrocodone; Janumet [sitagliptin-metformin hcl]; Motrin [ibuprofen]; and Tramadol  Family History  Problem Relation Age of Onset  . Breast cancer Mother 78  . Breast cancer Other   . Breast cancer Maternal Aunt        2 mat aunts    Social History Social History   Tobacco Use  . Smoking status: Never Smoker  . Smokeless tobacco: Never Used  Substance Use Topics  . Alcohol use: No    Alcohol/week: 0.0 oz  . Drug use: No    Review of Systems Constitutional: No fever/chills Cardiovascular: Denies  chest pain. Respiratory: Denies shortness of breath. Gastrointestinal: No abdominal pain.  No nausea, no vomiting.  No diarrhea.  No constipation. Genitourinary: positive for dysuria. Musculoskeletal: Negative for back pain. Skin: Negative for rash. Neurological: Negative for headaches, focal weakness or numbness.   ____________________________________________   PHYSICAL EXAM:  VITAL SIGNS: ED Triage Vitals  Enc Vitals Group     BP 04/01/18 1512 (!) 152/77     Pulse Rate 04/01/18 1512 90     Resp 04/01/18 1512 16     Temp 04/01/18 1512 98.1 F (36.7 C)     Temp Source 04/01/18 1512 Oral     SpO2 04/01/18 1512 99 %     Weight 04/01/18 1511 166 lb (75.3 kg)     Height 04/01/18 1511 5\' 4"  (1.626 m)     Head Circumference --  Peak Flow --      Pain Score 04/01/18 1511 0     Pain Loc --      Pain Edu? --      Excl. in GC? --     Constitutional: Alert and oriented. Well appearing and in no acute distress. ENT      Head: Normocephalic and atraumatic.      Mouth/Throat: Mucous membranes are moist.Oropharynx non-erythematous. Cardiovascular: Normal rate, regular rhythm. Grossly normal heart sounds.  Good peripheral circulation. Respiratory: Normal respiratory effort without tachypnea nor retractions. Breath sounds are clear and equal bilaterally. No wheezes, rales, rhonchi. Gastrointestinal: Soft and nontender. Normal Bowel sounds. No CVA tenderness. Musculoskeletal:  Steady gait.  Neurologic:  Normal speech and language. Speech is normal. No gait instability.  Skin:  Skin is warm, dry and intact. No rash noted. Psychiatric: Mood and affect are normal. Speech and behavior are normal. Patient exhibits appropriate insight and judgment   ___________________________________________   LABS (all labs ordered are listed, but only abnormal results are displayed)  Labs Reviewed  URINALYSIS, COMPLETE (UACMP) WITH MICROSCOPIC - Abnormal; Notable for the following components:       Result Value   APPearance CLOUDY (*)    Glucose, UA 500 (*)    Hgb urine dipstick MODERATE (*)    Ketones, ur TRACE (*)    Protein, ur 100 (*)    Leukocytes, UA TRACE (*)    Bacteria, UA FEW (*)    All other components within normal limits  GLUCOSE, CAPILLARY - Abnormal; Notable for the following components:   Glucose-Capillary 467 (*)    All other components within normal limits  CBC WITH DIFFERENTIAL/PLATELET - Abnormal; Notable for the following components:   RDW 15.6 (*)    Platelets 140 (*)    Lymphs Abs 0.8 (*)    All other components within normal limits  BASIC METABOLIC PANEL - Abnormal; Notable for the following components:   Sodium 134 (*)    Chloride 99 (*)    Glucose, Bld 464 (*)    Creatinine, Ser 1.41 (*)    GFR calc non Af Amer 33 (*)    GFR calc Af Amer 38 (*)    All other components within normal limits  URINE CULTURE    Labs reviewed.  Most recent creatinine in January 2019 1.4. Creatinine clearance today calculated 29.  PROCEDURES Procedures     INITIAL IMPRESSION / ASSESSMENT AND PLAN / ED COURSE  Pertinent labs & imaging results that were available during my care of the patient were reviewed by me and considered in my medical decision making (see chart for details).  Patient with extensive comorbidities.  Patient with dysuria complaints, urinalysis reviewed with positive bacteria, cloudy as well as yeast present.  Patient does ulcer report vaginal itching.  Suspect patient blood sugar uncontrolled due to recent steroid injection.  Patient labs evaluated and appears consistent with chronic.  Will treat patient for UTI as well as vaginitis yeast with single Diflucan as well as renal dose Keflex.  Discussed importance of close follow-up with PCP to further manage blood sugar.  Recommend for patient to be seen by primary care tomorrow.  Strict follow-up and return parameters including proceeding directly to the ER for vomiting, fevers, abdominal pain or  worsening concerns.   Discussed follow up and return parameters including no resolution or any worsening concerns. Patient verbalized understanding and agreed to plan.   ____________________________________________   FINAL CLINICAL IMPRESSION(S) / ED DIAGNOSES  Final  diagnoses:  Dysuria  Elevated glucose  Yeast vaginitis     ED Discharge Orders        Ordered    fluconazole (DIFLUCAN) 150 MG tablet   Once     04/01/18 1717    cephALEXin (KEFLEX) 250 MG capsule  3 times daily     04/01/18 1717       Note: This dictation was prepared with Dragon dictation along with smaller phrase technology. Any transcriptional errors that result from this process are unintentional.         Renford Dills, NP 04/01/18 1723

## 2018-04-01 NOTE — Discharge Instructions (Addendum)
Take medication as prescribed. Rest. Drink plenty of fluids.  Monitor your blood sugar closely and keep a journal.  Close follow-up is very important.  You need to have follow-up with your primary care tomorrow to further manage her blood sugar.  Return to Urgent care or emergency room for fever, abdominal pain, vomiting, new or worsening concerns.

## 2018-04-01 NOTE — ED Triage Notes (Signed)
Patient in today c/o dysuria off and on x 1 month. Patient denies fever. She states she had a UTI last November.

## 2018-04-03 LAB — URINE CULTURE

## 2018-04-23 ENCOUNTER — Other Ambulatory Visit: Payer: Self-pay

## 2018-04-23 ENCOUNTER — Encounter: Payer: Self-pay | Admitting: Emergency Medicine

## 2018-04-23 ENCOUNTER — Ambulatory Visit
Admission: EM | Admit: 2018-04-23 | Discharge: 2018-04-23 | Disposition: A | Discharge status: Home / Self Care | Payer: Medicare Other | Attending: Internal Medicine | Admitting: Internal Medicine

## 2018-04-23 DIAGNOSIS — M533 Sacrococcygeal disorders, not elsewhere classified: Secondary | ICD-10-CM

## 2018-04-23 MED ORDER — GABAPENTIN 300 MG PO CAPS: 300.0000 mg | ORAL_CAPSULE | Freq: Two times a day (BID) | ORAL | 2 refills | Status: DC

## 2018-04-23 NOTE — ED Provider Notes (Signed)
[facility]    CSN: [id number] Arrival date & time: 04/23/18  1129     History   Chief Complaint Chief Complaint  Patient presents with  . Back Pain    HPI [name] is a 84 y.o. female.   She presents today with persistent pain in her low back, she says that she has had this for years.  She is currently taking Aleve for this, but says she is not supposed to.  She is also taking gabapentin once daily.  She follows with a pain management doctor in [place], and also a physiatrist at [facility].  She has an appointment for pain management follow-up on July 8.  She would like a refill on gabapentin so that she can take it twice daily. No new difficulty with weakness or clumsiness in her legs, says she is chronically a little unstable on her feet.  No change in bowel or bladder function.  She was able to walk into the urgent care independently, without assistive device.  No fever, no malaise.    HPI  Past Medical History:  Diagnosis Date  . Cervical disc disease   . Chronic kidney disease    stage 3  . COPD (chronic obstructive pulmonary disease) (HCC)   . Cystitis   . Degenerative joint disease   . Diabetes mellitus without complication (HCC)    DM type 2  . Diabetic retinopathy associated with type 2 diabetes mellitus, without macular edema   . Diverticulosis of colon    without mention of hemorrhage  . Gastric ulcer   . GERD (gastroesophageal reflux disease)   . Hyperlipemia   . Hypertension   . Mitral valve prolapse   . Premature atrial contraction   . Sick sinus syndrome (HCC)   . Sinoatrial node dysfunction [facility](HCC)     Patient Active Problem List   Diagnosis Date Noted  . History of peptic ulcer disease 09/16/2017  . Gait instability 09/16/2017  . Obesity (BMI 30-39.9) 03/10/2017  . Trochanteric bursitis of left hip 03/10/2017  . CKD (chronic kidney disease) stage 3, GFR 30-59 ml/min (HCC) 12/31/2016  . Moderate tricuspid  insufficiency 12/31/2016  . B12 deficiency 05/20/2016  . Moderate mitral insufficiency 12/25/2015  . Bilateral carotid artery stenosis 11/27/2015  . Chronic venous insufficiency 10/09/2015  . LVH (left ventricular hypertrophy) due to hypertensive disease, with heart failure (HCC) 10/09/2015  . Controlled type 2 diabetes mellitus with stage 3 chronic kidney disease (HCC) 04/06/2015  . Edema leg 04/05/2015  . Hypertensive left ventricular hypertrophy 04/05/2015  . DDD (degenerative disc disease), lumbar 12/13/2014  . Neuritis or radiculitis due to rupture of lumbar intervertebral disc 12/13/2014  . Hyperlipidemia due to type 2 diabetes mellitus (HCC) 08/15/2014  . COPD, mild (HCC) 08/14/2014  . Incomplete bladder emptying 07/05/2014  . Neuralgia neuritis, sciatic nerve 07/05/2014  . Urge incontinence 07/05/2014  . FOM (frequency of micturition) 07/05/2014  . Benign essential HTN 06/14/2014  . Lumbar canal stenosis 06/14/2014  . SA node dysfunction (HCC) 06/14/2014  . Hypertension associated with diabetes (HCC) 06/14/2014    Past Surgical History:  Procedure Laterality Date  . ABDOMINAL HYSTERECTOMY    . BACK SURGERY     lumbar back surgery  . COLONOSCOPY    . ESOPHAGOGASTRODUODENOSCOPY    . gastric ulcer surgery    . KIDNEY SURGERY    . pacemaker     insertion dual chamber pacemaker generator  . PARATHYROIDECTOMY    . THYROIDECTOMY, PARTIAL  lobectomy partial thyroid     Home Medications    Prior to Admission medications   Medication Sig Start Date End Date Taking? Authorizing Provider  aspirin EC 81 MG tablet Take 81 mg by mouth daily.   Yes [provider]  atorvastatin (LIPITOR) 20 MG tablet Take 20 mg by mouth daily.   Yes [provider]  budesonide-formoterol (SYMBICORT) 160-4.5 MCG/ACT inhaler Inhale 2 puffs into the lungs 2 (two) times daily as needed.  07/17/17  Yes [provider]  carvedilol (COREG) 3.125 MG tablet Take 3.125 mg by  mouth 2 (two) times daily with a meal.    Yes [provider]  glipiZIDE (GLUCOTROL XL) 5 MG 24 hr tablet Take 10 mg by mouth 2 (two) times daily.    Yes [provider]  glucose blood (PRECISION QID TEST) test strip Use 2 (two) times daily. True metrix meter. Diagnosis: E11.29 06/01/17 06/01/18 Yes [provider]  isosorbide mononitrate (IMDUR) 60 MG 24 hr tablet Take 60 mg by mouth daily.    Yes [provider]  losartan (COZAAR) 100 MG tablet Take 100 mg by mouth daily.  03/20/17 04/23/18 Yes [provider]  pantoprazole (PROTONIX) 40 MG tablet Take 40 mg by mouth daily.   Yes [provider]  gabapentin (NEURONTIN) 300 MG capsule Take 1 capsule (300 mg total) by mouth 2 (two) times daily. 04/23/18   Isa Rankin, MD    Family History Family History  Problem Relation Age of Onset  . Breast cancer Mother 80  . Breast cancer Other   . Breast cancer Maternal Aunt        2 mat aunts    Social History Social History   Tobacco Use  . Smoking status: Never Smoker  . Smokeless tobacco: Never Used  Substance Use Topics  . Alcohol use: No    Alcohol/week: 0.0 oz  . Drug use: No     Allergies   Darvon [propoxyphene]; Hydrocodone; Janumet [sitagliptin-metformin hcl]; Motrin [ibuprofen]; and Tramadol   Review of Systems Review of Systems  All other systems reviewed and are negative.    Physical Exam Triage Vital Signs ED Triage Vitals  Enc Vitals Group     BP 04/23/18 1137 (!) 142/98     Pulse Rate 04/23/18 1137 86     Resp 04/23/18 1137 18     Temp 04/23/18 1137 97.9 F (36.6 C)     Temp Source 04/23/18 1137 Oral     SpO2 04/23/18 1137 98 %     Weight 04/23/18 1138 172 lb (78 kg)     Height 04/23/18 1138 5\' 4"  (1.626 m)     Pain Score 04/23/18 1137 10     Pain Loc --    Updated Vital Signs BP (!) 142/98 (BP Location: Right Arm)   Pulse 86   Temp 97.9 F (36.6 C) (Oral)   Resp 18   Ht 5\' 4"  (1.626 m)   Wt  172 lb (78 kg)   SpO2 98%   BMI 29.52 kg/m  Physical Exam  Constitutional: She is oriented to person, place, and time. No distress.  Alert, nicely groomed  HENT:  Head: Atraumatic.  Eyes:  Conjugate gaze, no eye redness/drainage  Neck: Neck supple.  Cardiovascular: Normal rate.  Pulmonary/Chest: No respiratory distress.  Lungs clear, symmetric breath sounds  Abdominal: She exhibits no distension.  Musculoskeletal: Normal range of motion.  No leg swelling No rash, no focal swelling/erythema/bruising over the low  back.  The sacrum appears to be the area of most discomfort, not particularly tender to palpation.  Neurological: She is alert and oriented to person, place, and time.  Skin: Skin is warm and dry.  No cyanosis  Nursing note and vitals reviewed.    UC Treatments / Results  Labs (all labs ordered are listed, but only abnormal results are displayed) Labs Reviewed - No data to display  EKG None  Radiology No results found.  Procedures Procedures (including critical care time)  Medications Ordered in UC Medications - No data to display  Final Clinical Impressions(s) / UC Diagnoses   Final diagnoses:  Coccydynia     Discharge Instructions     Please try increasing your gabapentin to twice daily, for back pain.  Prescription sent to [facility] pharmacy in [place].  Followup with your pain medicine doctor as planned in July.   ED Prescriptions    Medication Sig Dispense Auth. Provider   gabapentin (NEURONTIN) 300 MG capsule Take 1 capsule (300 mg total) by mouth 2 (two) times daily. 60 capsule Isa Rankin, MD       Isa Rankin, MD 04/27/18 [phone]

## 2018-04-23 NOTE — Discharge Instructions (Addendum)
Please try increasing your gabapentin to twice daily, for back pain.  Prescription sent to [facility] pharmacy in [place].  Followup with your pain medicine doctor as planned in July.

## 2018-04-23 NOTE — ED Triage Notes (Signed)
Patient c/o lower back pain x 2-3 months. Patient requesting medication for pain.

## 2018-07-10 IMAGING — US US EXTREM LOW VENOUS*L*
1 series · 13 of 24 positions shown · non-contrast
Comparison: None.

CLINICAL DATA: 85-year-old female with left leg pain and swelling
for the past month.

EXAM:
Left LOWER EXTREMITY VENOUS DOPPLER ULTRASOUND
TECHNIQUE: Gray-scale sonography with graded compression, as well as color
Doppler and duplex ultrasound were performed to evaluate the lower
extremity deep venous systems from the level of the common femoral
vein and including the common femoral, femoral, profunda femoral,
popliteal and calf veins including the posterior tibial, peroneal
and gastrocnemius veins when visible. Spectral Doppler was utilized
to evaluate flow at rest and with distal augmentation maneuvers in
the common femoral, femoral and popliteal veins.

[Series 1: us extrem low venous*left* · 0.07mm/px · 13 of 34 slices shown]
[im 1/34]
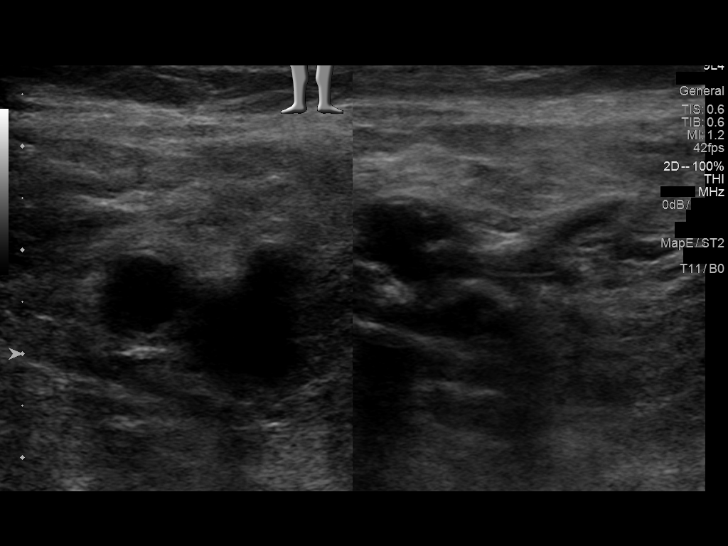
[im 3/34]
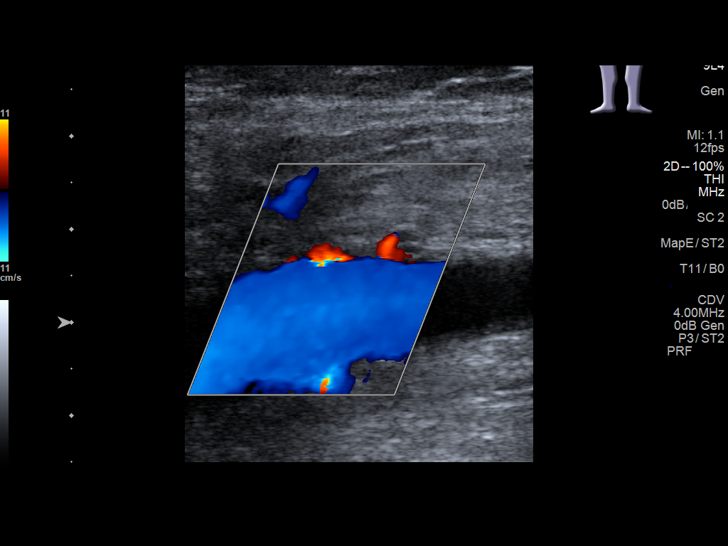
[im 6/34]
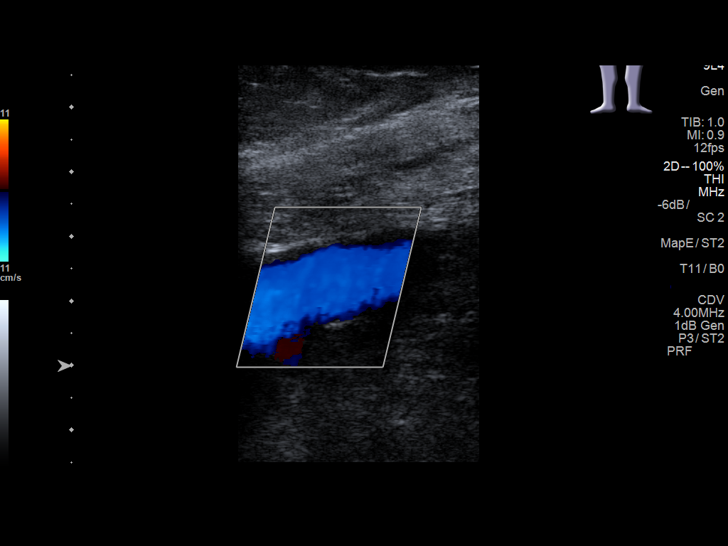
[im 9/34]
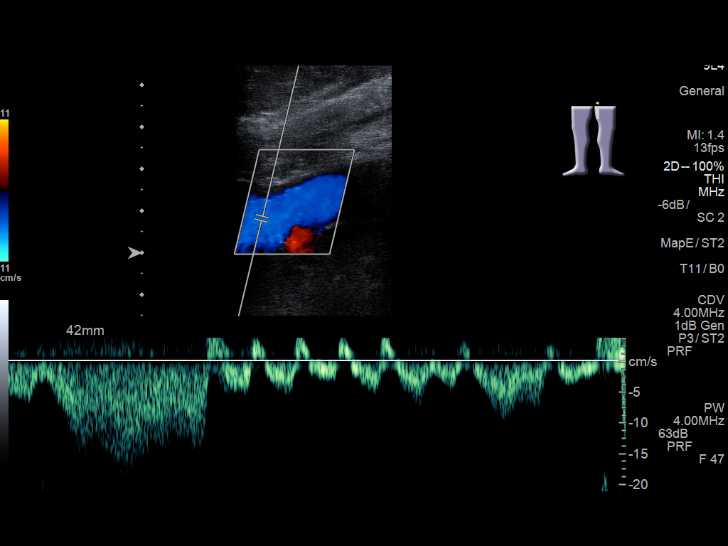
[im 12/34]
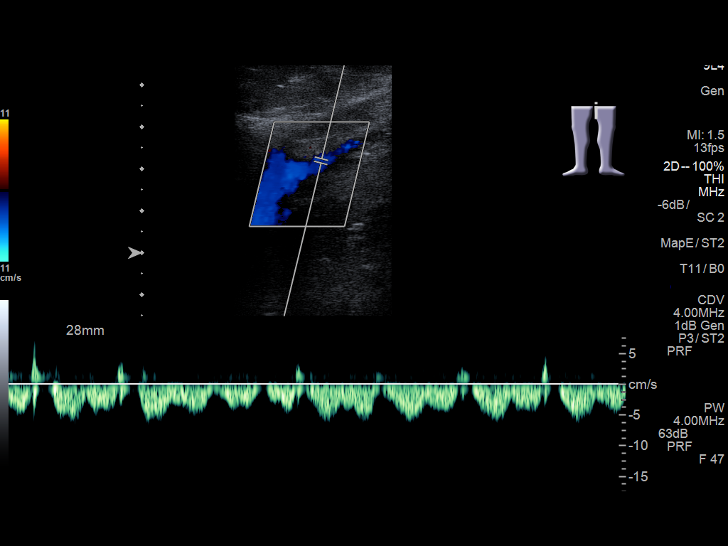
[im 15/34]
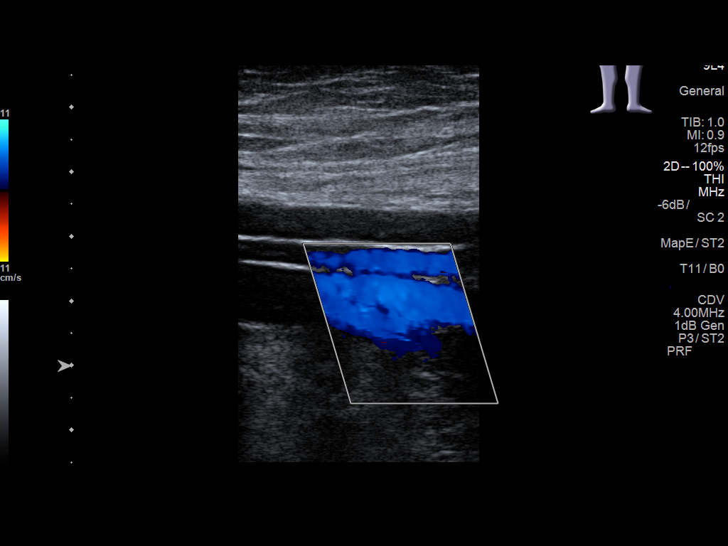
[im 18/34]
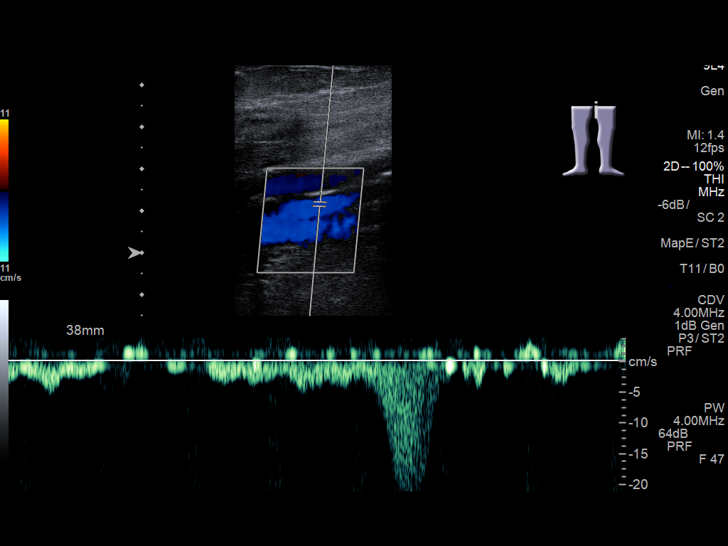
[im 19/34]
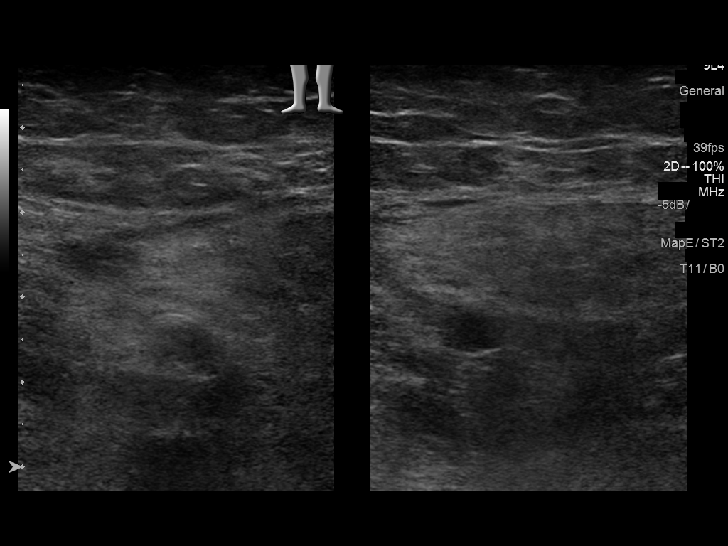
[im 22/34]
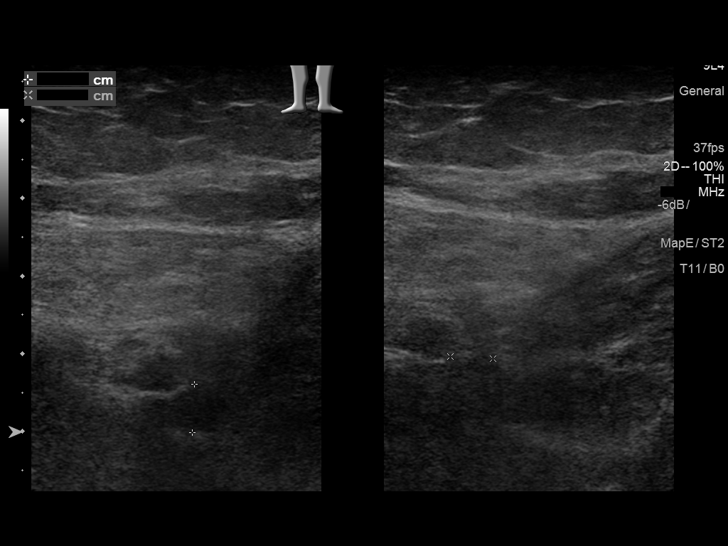
[im 25/34]
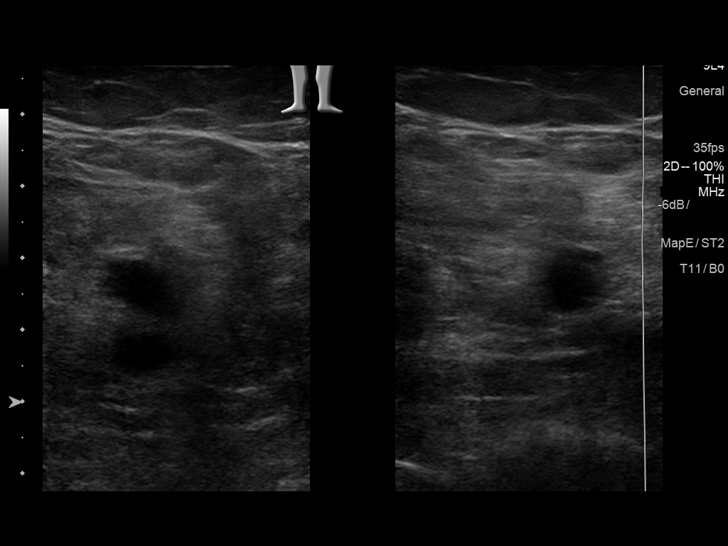
[im 28/34]
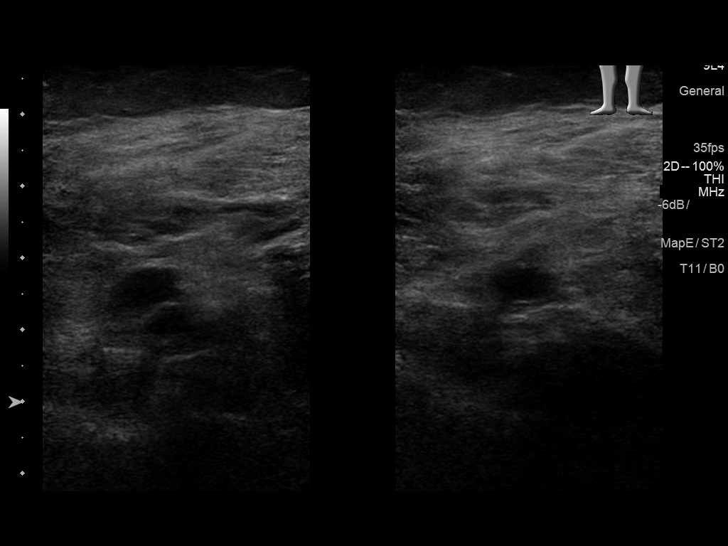
[im 31/34]
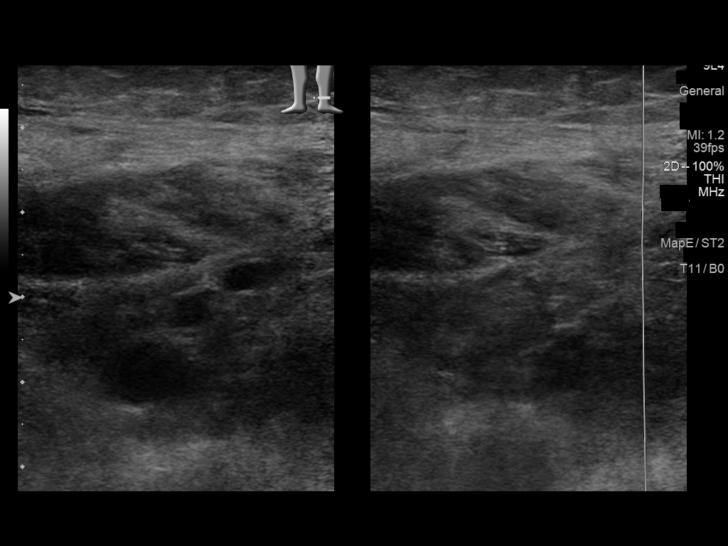
[im 34/34]
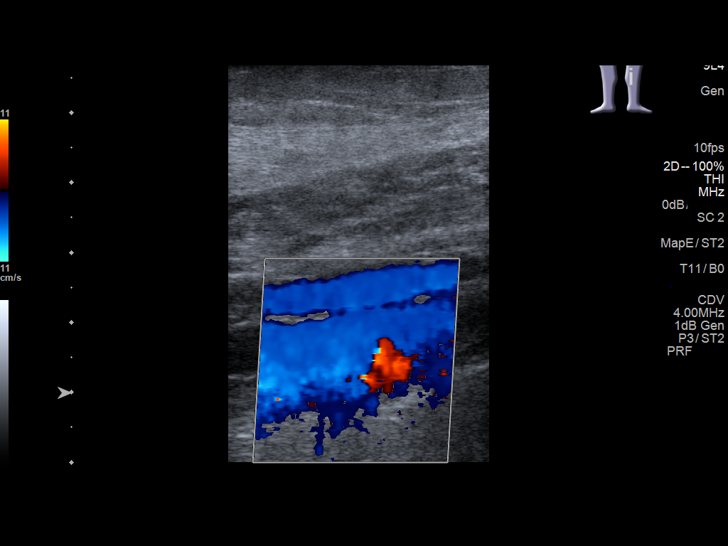

[13 of 24 positions shown; findings below may reference images not displayed]

FINDINGS: Contralateral Common Femoral Vein: Respiratory phasicity is normal
and symmetric with the symptomatic side. No evidence of thrombus.
Normal compressibility.

Common Femoral Vein: No evidence of thrombus. Normal
compressibility, respiratory phasicity and response to augmentation.

Saphenofemoral Junction: No evidence of thrombus. Normal
compressibility and flow on color Doppler imaging.

Profunda Femoral Vein: No evidence of thrombus. Normal
compressibility and flow on color Doppler imaging.

Femoral Vein: No evidence of thrombus. Normal compressibility,
respiratory phasicity and response to augmentation.

Popliteal Vein: No evidence of thrombus. Normal compressibility,
respiratory phasicity and response to augmentation.

Calf Veins: No evidence of thrombus. Normal compressibility and flow
on color Doppler imaging.
IMPRESSION: No evidence of left-sided deep venous thrombosis.

## 2018-07-12 ENCOUNTER — Other Ambulatory Visit: Payer: Self-pay | Admitting: Physician Assistant

## 2018-07-12 ENCOUNTER — Ambulatory Visit
Admission: RE | Admit: 2018-07-12 | Discharge: 2018-07-12 | Disposition: A | Discharge status: Home / Self Care | Payer: Medicare Other | Source: Ambulatory Visit | Attending: Physician Assistant | Admitting: Physician Assistant

## 2018-07-12 DIAGNOSIS — S3210XG Unspecified fracture of sacrum, subsequent encounter for fracture with delayed healing: Secondary | ICD-10-CM | POA: Diagnosis present

## 2018-07-12 DIAGNOSIS — M5136 Other intervertebral disc degeneration, lumbar region: Secondary | ICD-10-CM | POA: Insufficient documentation

## 2018-07-12 DIAGNOSIS — X58XXXD Exposure to other specified factors, subsequent encounter: Secondary | ICD-10-CM | POA: Diagnosis not present

## 2018-08-13 ENCOUNTER — Other Ambulatory Visit: Payer: Self-pay | Admitting: Physician Assistant

## 2018-08-13 ENCOUNTER — Ambulatory Visit
Admission: RE | Admit: 2018-08-13 | Discharge: 2018-08-13 | Disposition: A | Discharge status: Home / Self Care | Payer: Medicare Other | Source: Ambulatory Visit | Attending: Physician Assistant | Admitting: Physician Assistant

## 2018-08-13 DIAGNOSIS — Z794 Long term (current) use of insulin: Secondary | ICD-10-CM | POA: Diagnosis not present

## 2018-08-13 DIAGNOSIS — R42 Dizziness and giddiness: Secondary | ICD-10-CM | POA: Insufficient documentation

## 2018-08-13 DIAGNOSIS — E1165 Type 2 diabetes mellitus with hyperglycemia: Secondary | ICD-10-CM | POA: Insufficient documentation

## 2018-08-13 DIAGNOSIS — G44311 Acute post-traumatic headache, intractable: Secondary | ICD-10-CM | POA: Insufficient documentation

## 2018-08-13 DIAGNOSIS — W19XXXD Unspecified fall, subsequent encounter: Secondary | ICD-10-CM | POA: Diagnosis not present

## 2018-08-20 DIAGNOSIS — M8448XA Pathological fracture, other site, initial encounter for fracture: Secondary | ICD-10-CM | POA: Insufficient documentation

## 2018-10-24 ENCOUNTER — Ambulatory Visit
Admission: EM | Admit: 2018-10-24 | Discharge: 2018-10-24 | Disposition: A | Discharge status: Home / Self Care | Payer: Medicare Other | Source: Ambulatory Visit

## 2019-05-03 ENCOUNTER — Other Ambulatory Visit: Payer: Self-pay | Admitting: Internal Medicine

## 2019-05-03 ENCOUNTER — Ambulatory Visit
Admission: RE | Admit: 2019-05-03 | Discharge: 2019-05-03 | Disposition: A | Discharge status: Home / Self Care | Payer: Medicare Other | Source: Ambulatory Visit | Attending: Internal Medicine | Admitting: Internal Medicine

## 2019-05-03 ENCOUNTER — Other Ambulatory Visit: Payer: Self-pay

## 2019-05-03 ENCOUNTER — Ambulatory Visit: Payer: Medicare Other

## 2019-05-03 DIAGNOSIS — I639 Cerebral infarction, unspecified: Secondary | ICD-10-CM | POA: Insufficient documentation

## 2019-05-03 LAB — POCT I-STAT CREATININE: Creatinine, Ser: 1.2 mg/dL — ABNORMAL HIGH (ref 0.44–1.00)

## 2019-05-03 MED ORDER — IOHEXOL 300 MG/ML  SOLN
75.0000 mL | Freq: Once | INTRAMUSCULAR | Status: AC | PRN
  Administered 2019-05-03: 75 mL via INTRAVENOUS

## 2019-05-25 DIAGNOSIS — Z8673 Personal history of transient ischemic attack (TIA), and cerebral infarction without residual deficits: Secondary | ICD-10-CM | POA: Insufficient documentation

## 2020-02-17 ENCOUNTER — Other Ambulatory Visit: Payer: Self-pay

## 2020-02-17 ENCOUNTER — Emergency Department: Payer: Medicare Other

## 2020-02-17 ENCOUNTER — Observation Stay
Admission: EM | Admit: 2020-02-17 | Discharge: 2020-02-18 | Disposition: A | Discharge status: Home / Self Care | Payer: Medicare Other | Attending: Internal Medicine | Admitting: Internal Medicine

## 2020-02-17 DIAGNOSIS — R531 Weakness: Secondary | ICD-10-CM | POA: Insufficient documentation

## 2020-02-17 DIAGNOSIS — I110 Hypertensive heart disease with heart failure: Secondary | ICD-10-CM | POA: Insufficient documentation

## 2020-02-17 DIAGNOSIS — Z7982 Long term (current) use of aspirin: Secondary | ICD-10-CM | POA: Insufficient documentation

## 2020-02-17 DIAGNOSIS — I495 Sick sinus syndrome: Secondary | ICD-10-CM | POA: Diagnosis not present

## 2020-02-17 DIAGNOSIS — I700 Atherosclerosis of aorta: Secondary | ICD-10-CM | POA: Insufficient documentation

## 2020-02-17 DIAGNOSIS — K219 Gastro-esophageal reflux disease without esophagitis: Secondary | ICD-10-CM | POA: Diagnosis not present

## 2020-02-17 DIAGNOSIS — Z20822 Contact with and (suspected) exposure to covid-19: Secondary | ICD-10-CM | POA: Diagnosis not present

## 2020-02-17 DIAGNOSIS — E872 Acidosis, unspecified: Secondary | ICD-10-CM | POA: Diagnosis present

## 2020-02-17 DIAGNOSIS — Z888 Allergy status to other drugs, medicaments and biological substances status: Secondary | ICD-10-CM | POA: Insufficient documentation

## 2020-02-17 DIAGNOSIS — G9341 Metabolic encephalopathy: Secondary | ICD-10-CM | POA: Diagnosis present

## 2020-02-17 DIAGNOSIS — Z950 Presence of cardiac pacemaker: Secondary | ICD-10-CM | POA: Diagnosis not present

## 2020-02-17 DIAGNOSIS — E1122 Type 2 diabetes mellitus with diabetic chronic kidney disease: Secondary | ICD-10-CM | POA: Diagnosis not present

## 2020-02-17 DIAGNOSIS — N390 Urinary tract infection, site not specified: Secondary | ICD-10-CM | POA: Diagnosis present

## 2020-02-17 DIAGNOSIS — Z7984 Long term (current) use of oral hypoglycemic drugs: Secondary | ICD-10-CM | POA: Insufficient documentation

## 2020-02-17 DIAGNOSIS — I130 Hypertensive heart and chronic kidney disease with heart failure and stage 1 through stage 4 chronic kidney disease, or unspecified chronic kidney disease: Secondary | ICD-10-CM | POA: Diagnosis not present

## 2020-02-17 DIAGNOSIS — N179 Acute kidney failure, unspecified: Secondary | ICD-10-CM | POA: Insufficient documentation

## 2020-02-17 DIAGNOSIS — I509 Heart failure, unspecified: Secondary | ICD-10-CM | POA: Insufficient documentation

## 2020-02-17 DIAGNOSIS — N3000 Acute cystitis without hematuria: Secondary | ICD-10-CM | POA: Diagnosis not present

## 2020-02-17 DIAGNOSIS — F0390 Unspecified dementia without behavioral disturbance: Secondary | ICD-10-CM | POA: Insufficient documentation

## 2020-02-17 DIAGNOSIS — N1831 Acute kidney failure, unspecified: Secondary | ICD-10-CM | POA: Diagnosis present

## 2020-02-17 DIAGNOSIS — I10 Essential (primary) hypertension: Secondary | ICD-10-CM | POA: Diagnosis present

## 2020-02-17 DIAGNOSIS — E11319 Type 2 diabetes mellitus with unspecified diabetic retinopathy without macular edema: Secondary | ICD-10-CM | POA: Diagnosis not present

## 2020-02-17 DIAGNOSIS — Z886 Allergy status to analgesic agent status: Secondary | ICD-10-CM | POA: Insufficient documentation

## 2020-02-17 DIAGNOSIS — Z7951 Long term (current) use of inhaled steroids: Secondary | ICD-10-CM | POA: Diagnosis not present

## 2020-02-17 DIAGNOSIS — I341 Nonrheumatic mitral (valve) prolapse: Secondary | ICD-10-CM | POA: Insufficient documentation

## 2020-02-17 DIAGNOSIS — R4182 Altered mental status, unspecified: Secondary | ICD-10-CM | POA: Diagnosis not present

## 2020-02-17 DIAGNOSIS — E1129 Type 2 diabetes mellitus with other diabetic kidney complication: Secondary | ICD-10-CM | POA: Diagnosis present

## 2020-02-17 DIAGNOSIS — Z885 Allergy status to narcotic agent status: Secondary | ICD-10-CM | POA: Insufficient documentation

## 2020-02-17 DIAGNOSIS — J449 Chronic obstructive pulmonary disease, unspecified: Secondary | ICD-10-CM | POA: Diagnosis not present

## 2020-02-17 DIAGNOSIS — E785 Hyperlipidemia, unspecified: Secondary | ICD-10-CM | POA: Diagnosis present

## 2020-02-17 DIAGNOSIS — Z79899 Other long term (current) drug therapy: Secondary | ICD-10-CM | POA: Insufficient documentation

## 2020-02-17 LAB — CBC WITH DIFFERENTIAL/PLATELET
Abs Immature Granulocytes: 0.01 10*3/uL (ref 0.00–0.07)
Basophils Absolute: 0 10*3/uL (ref 0.0–0.1)
Basophils Relative: 1 %
Eosinophils Absolute: 0.1 10*3/uL (ref 0.0–0.5)
Eosinophils Relative: 2 %
HCT: 38.6 % (ref 36.0–46.0)
Hemoglobin: 12.4 g/dL (ref 12.0–15.0)
Immature Granulocytes: 0 %
Lymphocytes Relative: 24 %
Lymphs Abs: 1 10*3/uL (ref 0.7–4.0)
MCH: 26.3 pg (ref 26.0–34.0)
MCHC: 32.1 g/dL (ref 30.0–36.0)
MCV: 81.8 fL (ref 80.0–100.0)
Monocytes Absolute: 0.3 10*3/uL (ref 0.1–1.0)
Monocytes Relative: 7 %
Neutro Abs: 2.8 10*3/uL (ref 1.7–7.7)
Neutrophils Relative %: 66 %
Platelets: 152 10*3/uL (ref 150–400)
RBC: 4.72 MIL/uL (ref 3.87–5.11)
RDW: 14.6 % (ref 11.5–15.5)
WBC: 4.2 10*3/uL (ref 4.0–10.5)
nRBC: 0 % (ref 0.0–0.2)

## 2020-02-17 LAB — LACTIC ACID, PLASMA
Lactic Acid, Venous: 2 mmol/L (ref 0.5–1.9)
Lactic Acid, Venous: 2.5 mmol/L (ref 0.5–1.9)
Lactic Acid, Venous: 3.3 mmol/L (ref 0.5–1.9)
Lactic Acid, Venous: 5.1 mmol/L (ref 0.5–1.9)

## 2020-02-17 LAB — HEMOGLOBIN A1C
Hgb A1c MFr Bld: 6.3 % — ABNORMAL HIGH (ref 4.8–5.6)
Mean Plasma Glucose: 134.11 mg/dL

## 2020-02-17 LAB — URINALYSIS, COMPLETE (UACMP) WITH MICROSCOPIC
Bilirubin Urine: NEGATIVE
Glucose, UA: NEGATIVE mg/dL
Hgb urine dipstick: NEGATIVE
Ketones, ur: NEGATIVE mg/dL
Nitrite: NEGATIVE
Protein, ur: NEGATIVE mg/dL
Specific Gravity, Urine: 1.017 (ref 1.005–1.030)
pH: 5 (ref 5.0–8.0)

## 2020-02-17 LAB — BASIC METABOLIC PANEL
Anion gap: 10 (ref 5–15)
BUN: 23 mg/dL (ref 8–23)
CO2: 24 mmol/L (ref 22–32)
Calcium: 9.7 mg/dL (ref 8.9–10.3)
Chloride: 105 mmol/L (ref 98–111)
Creatinine, Ser: 1.66 mg/dL — ABNORMAL HIGH (ref 0.44–1.00)
GFR calc Af Amer: 32 mL/min — ABNORMAL LOW (ref >60)
GFR calc non Af Amer: 28 mL/min — ABNORMAL LOW (ref >60)
Glucose, Bld: 206 mg/dL — ABNORMAL HIGH (ref 70–99)
Potassium: 4.2 mmol/L (ref 3.5–5.1)
Sodium: 139 mmol/L (ref 135–145)

## 2020-02-17 LAB — GLUCOSE, CAPILLARY: Glucose-Capillary: 114 mg/dL — ABNORMAL HIGH (ref 70–99)

## 2020-02-17 LAB — MAGNESIUM: Magnesium: 1.8 mg/dL (ref 1.7–2.4)

## 2020-02-17 MED ORDER — ONDANSETRON HCL 4 MG/2ML IJ SOLN
4.0000 mg | Freq: Three times a day (TID) | INTRAMUSCULAR | Status: DC | PRN
  Administered 2020-02-17: 21:00:00 4 mg via INTRAVENOUS
  Filled 2020-02-17: qty 2

## 2020-02-17 MED ORDER — FE FUMARATE-B12-VIT C-FA-IFC PO CAPS
1.0000 | ORAL_CAPSULE | Freq: Every day | ORAL | Status: DC
  Administered 2020-02-18: 09:00:00 1 via ORAL
  Filled 2020-02-17: qty 1

## 2020-02-17 MED ORDER — INSULIN ASPART 100 UNIT/ML ~~LOC~~ SOLN: 0.0000 [IU] | Freq: Every day | SUBCUTANEOUS | Status: DC

## 2020-02-17 MED ORDER — MOMETASONE FURO-FORMOTEROL FUM 200-5 MCG/ACT IN AERO
2.0000 | INHALATION_SPRAY | Freq: Two times a day (BID) | RESPIRATORY_TRACT | Status: DC
  Administered 2020-02-17 – 2020-02-18 (×2): 2 via RESPIRATORY_TRACT
  Filled 2020-02-17: qty 8.8

## 2020-02-17 MED ORDER — ALBUTEROL SULFATE (2.5 MG/3ML) 0.083% IN NEBU: 2.5000 mg | INHALATION_SOLUTION | RESPIRATORY_TRACT | Status: DC | PRN

## 2020-02-17 MED ORDER — ACETAMINOPHEN 325 MG PO TABS: 650.0000 mg | ORAL_TABLET | Freq: Four times a day (QID) | ORAL | Status: DC | PRN

## 2020-02-17 MED ORDER — VITAMIN B-12 1000 MCG PO TABS
1000.0000 ug | ORAL_TABLET | Freq: Every day | ORAL | Status: DC
  Administered 2020-02-18: 09:00:00 1000 ug via ORAL
  Filled 2020-02-17: qty 1

## 2020-02-17 MED ORDER — PANTOPRAZOLE SODIUM 40 MG PO TBEC: 40.0000 mg | DELAYED_RELEASE_TABLET | Freq: Every day | ORAL | Status: DC | PRN

## 2020-02-17 MED ORDER — LACTATED RINGERS IV BOLUS: 1000.0000 mL | Freq: Once | INTRAVENOUS | Status: DC

## 2020-02-17 MED ORDER — ASPIRIN EC 81 MG PO TBEC
81.0000 mg | DELAYED_RELEASE_TABLET | Freq: Every day | ORAL | Status: DC
  Administered 2020-02-18: 09:00:00 81 mg via ORAL
  Filled 2020-02-17: qty 1

## 2020-02-17 MED ORDER — ENOXAPARIN SODIUM 30 MG/0.3ML ~~LOC~~ SOLN
30.0000 mg | SUBCUTANEOUS | Status: DC
  Administered 2020-02-17: 30 mg via SUBCUTANEOUS
  Filled 2020-02-17: qty 0.3

## 2020-02-17 MED ORDER — LACTATED RINGERS IV BOLUS
1000.0000 mL | Freq: Once | INTRAVENOUS | Status: AC
  Administered 2020-02-17: 12:00:00 1000 mL via INTRAVENOUS

## 2020-02-17 MED ORDER — ENOXAPARIN SODIUM 40 MG/0.4ML ~~LOC~~ SOLN: 40.0000 mg | SUBCUTANEOUS | Status: DC

## 2020-02-17 MED ORDER — SODIUM CHLORIDE 0.9 % IV SOLN
1.0000 g | Freq: Once | INTRAVENOUS | Status: AC
  Administered 2020-02-17: 12:00:00 1 g via INTRAVENOUS

## 2020-02-17 MED ORDER — SODIUM CHLORIDE 0.9 % IV SOLN
1.0000 g | INTRAVENOUS | Status: DC
  Administered 2020-02-18: 12:00:00 1 g via INTRAVENOUS
  Filled 2020-02-17: qty 1
  Filled 2020-02-17: qty 10

## 2020-02-17 MED ORDER — SODIUM CHLORIDE 0.9 % IV SOLN: INTRAVENOUS | Status: DC

## 2020-02-17 MED ORDER — AMLODIPINE BESYLATE 5 MG PO TABS
10.0000 mg | ORAL_TABLET | Freq: Every day | ORAL | Status: DC
  Administered 2020-02-17 – 2020-02-18 (×2): 10 mg via ORAL
  Filled 2020-02-17 (×2): qty 2

## 2020-02-17 MED ORDER — HYDRALAZINE HCL 20 MG/ML IJ SOLN
5.0000 mg | INTRAMUSCULAR | Status: DC | PRN
  Administered 2020-02-17: 21:00:00 5 mg via INTRAVENOUS
  Filled 2020-02-17: qty 1

## 2020-02-17 MED ORDER — HYDRALAZINE HCL 20 MG/ML IJ SOLN: 10.0000 mg | Freq: Once | INTRAMUSCULAR | Status: DC

## 2020-02-17 MED ORDER — ISOSORBIDE MONONITRATE ER 30 MG PO TB24
60.0000 mg | ORAL_TABLET | Freq: Every day | ORAL | Status: DC
  Administered 2020-02-17 – 2020-02-18 (×2): 60 mg via ORAL
  Filled 2020-02-17 (×2): qty 2

## 2020-02-17 MED ORDER — LACTATED RINGERS IV BOLUS
500.0000 mL | Freq: Once | INTRAVENOUS | Status: AC
  Administered 2020-02-17: 23:00:00 500 mL via INTRAVENOUS

## 2020-02-17 MED ORDER — HYDRALAZINE HCL 20 MG/ML IJ SOLN
10.0000 mg | Freq: Once | INTRAMUSCULAR | Status: AC
  Administered 2020-02-17: 23:00:00 10 mg via INTRAVENOUS
  Filled 2020-02-17: qty 1

## 2020-02-17 MED ORDER — TOBRAMYCIN 0.3 % OP SOLN
1.0000 [drp] | Freq: Every day | OPHTHALMIC | Status: DC
  Administered 2020-02-17: 23:00:00 1 [drp] via OPHTHALMIC
  Filled 2020-02-17: qty 5

## 2020-02-17 MED ORDER — LACTATED RINGERS IV SOLN: INTRAVENOUS | Status: DC

## 2020-02-17 MED ORDER — HYDRALAZINE HCL 20 MG/ML IJ SOLN
INTRAMUSCULAR | Status: AC
  Administered 2020-02-17: 5 mg via INTRAVENOUS
  Filled 2020-02-17: qty 1

## 2020-02-17 MED ORDER — ATORVASTATIN CALCIUM 20 MG PO TABS: 40.0000 mg | ORAL_TABLET | Freq: Every day | ORAL | Status: DC

## 2020-02-17 MED ORDER — INSULIN ASPART 100 UNIT/ML ~~LOC~~ SOLN
0.0000 [IU] | Freq: Three times a day (TID) | SUBCUTANEOUS | Status: DC
  Administered 2020-02-18: 1 [IU] via SUBCUTANEOUS

## 2020-02-17 MED ORDER — CLOPIDOGREL BISULFATE 75 MG PO TABS
75.0000 mg | ORAL_TABLET | Freq: Every day | ORAL | Status: DC
  Administered 2020-02-18: 09:00:00 75 mg via ORAL
  Filled 2020-02-17: qty 1

## 2020-02-17 MED ORDER — IPRATROPIUM BROMIDE 0.03 % NA SOLN
1.0000 | Freq: Two times a day (BID) | NASAL | Status: DC | PRN
  Filled 2020-02-17: qty 30

## 2020-02-17 NOTE — ED Notes (Signed)
Secure chat Dr. Clyde Lundborg regarding pt hypertension despite PRN hypertension meds.

## 2020-02-17 NOTE — ED Notes (Signed)
Date and time results received: 02/17/20 12:04 PM   Test: lactic Critical Value: 2.0  Name of Provider Notified: Dr. Larinda Buttery

## 2020-02-17 NOTE — ED Triage Notes (Addendum)
To ED via [facility] from home c/o weakness. Daughter reports pt slumped over in chair this AM after eating breakfast. EMS reports foul smelling urine.   CBG 123, 98.6 temp, 160/100 BP

## 2020-02-17 NOTE — ED Notes (Signed)
Report to Kelly, RN.

## 2020-02-17 NOTE — ED Provider Notes (Signed)
[facility] Emergency Department Provider Note   ____________________________________________   First MD Initiated Contact with Patient 02/17/20 1117     (approximate)  I have reviewed the triage vital signs and the nursing notes.   HISTORY  Chief Complaint Weakness    HPI [name] is a 86 y.o. female with past medical history of COPD, hypertension, sick sinus syndrome status post pacemaker, diabetes, and CKD who presents to the ED for weakness.  History is limited due to confusion.  Per EMS, daughter found patient's urine to be dark and foul-smelling over the past couple of days.  She became more concerned this morning, when she found the patient slumped over at the kitchen table after eating breakfast.  Afterwards, patient was easily arousable and remained awake and alert throughout EMS transport.  Patient currently denies any complaints including abdominal pain, nausea, vomiting, dysuria, hematuria, or fever.  She has not had any recent cough, chest pain, or shortness of breath.  Patient is able to state her name, but believes she is in [place] and does not know the year.        Past Medical History:  Diagnosis Date  . Cervical disc disease   . Chronic kidney disease    stage 3  . COPD (chronic obstructive pulmonary disease) (HCC)   . Cystitis   . Degenerative joint disease   . Diabetes mellitus without complication (HCC)    DM type 2  . Diabetic retinopathy associated with type 2 diabetes mellitus, without macular edema   . Diverticulosis of colon    without mention of hemorrhage  . Gastric ulcer   . GERD (gastroesophageal reflux disease)   . Hyperlipemia   . Hypertension   . Mitral valve prolapse   . Premature atrial contraction   . Sick sinus syndrome (HCC)   . Sinoatrial node dysfunction [facility])     Patient Active Problem List   Diagnosis Date Noted  . Acute metabolic encephalopathy 02/17/2020  . UTI (urinary tract infection)  02/17/2020  . HTN (hypertension) 02/17/2020  . HLD (hyperlipidemia) 02/17/2020  . Type II diabetes mellitus with renal manifestations (HCC) 02/17/2020  . GERD (gastroesophageal reflux disease) 02/17/2020  . History of peptic ulcer disease 09/16/2017  . Gait instability 09/16/2017  . Obesity (BMI 30-39.9) 03/10/2017  . Trochanteric bursitis of left hip 03/10/2017  . Acute renal failure superimposed on stage 3a chronic kidney disease (HCC) 12/31/2016  . Moderate tricuspid insufficiency 12/31/2016  . B12 deficiency 05/20/2016  . Moderate mitral insufficiency 12/25/2015  . Bilateral carotid artery stenosis 11/27/2015  . Chronic venous insufficiency 10/09/2015  . LVH (left ventricular hypertrophy) due to hypertensive disease, with heart failure (HCC) 10/09/2015  . Controlled type 2 diabetes mellitus with stage 3 chronic kidney disease (HCC) 04/06/2015  . Edema leg 04/05/2015  . Hypertensive left ventricular hypertrophy 04/05/2015  . DDD (degenerative disc disease), lumbar 12/13/2014  . Neuritis or radiculitis due to rupture of lumbar intervertebral disc 12/13/2014  . Hyperlipidemia due to type 2 diabetes mellitus (HCC) 08/15/2014  . COPD, mild (HCC) 08/14/2014  . Incomplete bladder emptying 07/05/2014  . Neuralgia neuritis, sciatic nerve 07/05/2014  . Urge incontinence 07/05/2014  . FOM (frequency of micturition) 07/05/2014  . Benign essential HTN 06/14/2014  . Lumbar canal stenosis 06/14/2014  . SA node dysfunction (HCC) 06/14/2014  . Hypertension associated with diabetes (HCC) 06/14/2014    Past Surgical History:  Procedure Laterality Date  . ABDOMINAL HYSTERECTOMY    . BACK SURGERY  lumbar back surgery  . COLONOSCOPY    . ESOPHAGOGASTRODUODENOSCOPY    . gastric ulcer surgery    . KIDNEY SURGERY    . pacemaker     insertion dual chamber pacemaker generator  . PARATHYROIDECTOMY    . THYROIDECTOMY, PARTIAL     lobectomy partial thyroid    Prior to Admission medications    Medication Sig Start Date End Date Taking? Authorizing Provider  aspirin EC 81 MG tablet Take 81 mg by mouth daily.    [provider]  atorvastatin (LIPITOR) 20 MG tablet Take 20 mg by mouth daily.    [provider]  budesonide-formoterol (SYMBICORT) 160-4.5 MCG/ACT inhaler Inhale 2 puffs into the lungs 2 (two) times daily as needed.  07/17/17   [provider]  carvedilol (COREG) 3.125 MG tablet Take 3.125 mg by mouth 2 (two) times daily with a meal.     [provider]  gabapentin (NEURONTIN) 300 MG capsule Take 1 capsule (300 mg total) by mouth 2 (two) times daily. 04/23/18   Wynona Luna, MD  glipiZIDE (GLUCOTROL XL) 5 MG 24 hr tablet Take 10 mg by mouth 2 (two) times daily.     [provider]  isosorbide mononitrate (IMDUR) 60 MG 24 hr tablet Take 60 mg by mouth daily.     [provider]  losartan (COZAAR) 100 MG tablet Take 100 mg by mouth daily.  03/20/17 04/23/18  [provider]  pantoprazole (PROTONIX) 40 MG tablet Take 40 mg by mouth daily.    [provider]    Allergies Darvon [propoxyphene], Hydrocodone, Janumet [sitagliptin-metformin hcl], Motrin [ibuprofen], and Tramadol  Family History  Problem Relation Age of Onset  . Breast cancer Mother 32  . Breast cancer Other   . Breast cancer Maternal Aunt        2 mat aunts    Social History Social History   Tobacco Use  . Smoking status: Never Smoker  . Smokeless tobacco: Never Used  Substance Use Topics  . Alcohol use: No    Alcohol/week: 0.0 standard drinks  . Drug use: No    Review of Systems  Constitutional: No fever/chills.  Positive for confusion. Eyes: No visual changes. ENT: No sore throat. Cardiovascular: Denies chest pain. Respiratory: Denies shortness of breath. Gastrointestinal: No abdominal pain.  No nausea, no vomiting.  No diarrhea.  No constipation. Genitourinary: Negative for dysuria.  Positive for foul-smelling  urine. Musculoskeletal: Negative for back pain. Skin: Negative for rash. Neurological: Negative for headaches, focal weakness or numbness.  ____________________________________________   PHYSICAL EXAM:  VITAL SIGNS: ED Triage Vitals  Enc Vitals Group     BP      Pulse      Resp      Temp      Temp src      SpO2      Weight      Height      Head Circumference      Peak Flow      Pain Score      Pain Loc      Pain Edu?      Excl. in [place]?     Constitutional: Alert and oriented to person, but not place or time. Eyes: Conjunctivae are normal. Head: Atraumatic. Nose: No congestion/rhinnorhea. Mouth/Throat: Mucous membranes are moist. Neck: Normal ROM Cardiovascular: Normal rate, regular rhythm. Grossly normal heart sounds. Respiratory: Normal respiratory effort.  No retractions. Lungs CTAB. Gastrointestinal: Soft and nontender. No distention. Genitourinary:  deferred Musculoskeletal: No lower extremity tenderness nor edema. Neurologic:  Normal speech and language. No gross focal neurologic deficits are appreciated. Skin:  Skin is warm, dry and intact. No rash noted. Psychiatric: Mood and affect are normal. Speech and behavior are normal.  ____________________________________________   LABS (all labs ordered are listed, but only abnormal results are displayed)  Labs Reviewed  BASIC METABOLIC PANEL - Abnormal; Notable for the following components:      Result Value   Glucose, Bld 206 (*)    Creatinine, Ser 1.66 (*)    GFR calc non Af Amer 28 (*)    GFR calc Af Amer 32 (*)    All other components within normal limits  LACTIC ACID, PLASMA - Abnormal; Notable for the following components:   Lactic Acid, Venous 2.0 (*)    All other components within normal limits  LACTIC ACID, PLASMA - Abnormal; Notable for the following components:   Lactic Acid, Venous 2.5 (*)    All other components within normal limits  URINALYSIS, COMPLETE (UACMP) WITH MICROSCOPIC - Abnormal;  Notable for the following components:   Color, Urine YELLOW (*)    APPearance HAZY (*)    Leukocytes,Ua MODERATE (*)    Bacteria, UA MANY (*)    All other components within normal limits  URINE CULTURE  SARS CORONAVIRUS 2 (TAT 6-24 HRS)  CBC WITH DIFFERENTIAL/PLATELET   ____________________________________________  EKG  ED ECG REPORT I, Chesley Noon, the attending physician, personally viewed and interpreted this ECG.   Date: 02/17/2020  EKG Time: 11:21  Rate: 75  Rhythm: normal sinus rhythm  Axis: LAD  Intervals:right bundle branch block and left anterior fascicular block  ST&T Change: None   PROCEDURES  Procedure(s) performed (including Critical Care):  Procedures   ____________________________________________   INITIAL IMPRESSION / ASSESSMENT AND PLAN / ED COURSE       86 year old female with history of hypertension, diabetes, COPD, CKD, and sick sinus syndrome status post pacemaker who presents to the ED for foul-smelling urine over the past 2 days as well as an episode of weakness and somnolence this morning.  She does appear slightly confused at the moment, able to answer questions appropriately but is not oriented to place or time.  She has no focal neurologic deficits on exam and vital signs are not consistent with sepsis.  We will further assess with labs, chest x-ray, UA, and EKG.  EKG shows no evidence of arrhythmia or ischemia, chest x-ray also negative for acute process.  UA is concerning for UTI and lab work significant for mildly elevated lactic acid.  Vital signs do not appear concerning for sepsis, however with IV fluid hydration and recheck of lactate following Rocephin administration, it was noted to remain persistently elevated at 2.5.  Given this, case discussed with hospitalist for admission.      ____________________________________________   FINAL CLINICAL IMPRESSION(S) / ED DIAGNOSES  Final diagnoses:  Acute cystitis without hematuria    Altered mental status, unspecified altered mental status type     ED Discharge Orders    None       Note:  This document was prepared using Dragon voice recognition software and may include unintentional dictation errors.   Chesley Noon, MD 02/17/20 [phone]

## 2020-02-17 NOTE — ED Notes (Addendum)
Pt takes her BP meds at 1700 EDP aware of BP in ED

## 2020-02-17 NOTE — Progress Notes (Signed)
PHARMACIST - PHYSICIAN COMMUNICATION  CONCERNING:  Enoxaparin (Lovenox) for DVT Prophylaxis    RECOMMENDATION: Patient was prescribed enoxaparin 40mg  q24 hours for VTE prophylaxis.   Filed Weights   02/17/20 1953  Weight: 62.5 kg (137 lb 12.6 oz)    Body mass index is 23.65 kg/m.  CrCl cannot be calculated (Unknown ideal weight.).   Patient is candidate for enoxaparin 30mg  every 24 hours based on CrCl <57ml/min or Weight less then 45kg for women or <57kg for men   DESCRIPTION: Pharmacy has adjusted enoxaparin dose per [facility] policy.  Patient is now receiving enoxaparin 30mg  every 24 hours.  31m Pharmacy Resident 02/17/2020 9:12 PM

## 2020-02-17 NOTE — H&P (Signed)
History and Physical    [name] NKN:[id number] DOB: 1931-07-16 DOA: 02/17/2020  Referring MD/NP/PA:   PCP: [name], MD   Patient coming from:  The patient is coming from home.  At baseline, pt is partially dependent for most of ADL.        Chief Complaint: AMS  HPI: [name] is a 86 y.o. female with medical history significant of hypertension, hyperlipidemia, diabetes mellitus, COPD, GERD, SSS, pacemaker placement, mitral valve prolapse, gastric ulcer, CKD stage III, who presents with altered mental status.  Per her daughter, patient is normally orientated to place and person, sometimes to the time, but in the past several days, patient has been more confused.  She knows her name, but is not oriented to the place and time.  She moves all extremities.  No facial droop or slurred speech.  Patient does not have cough, shortness of breath or chest pain.  Patient had some nausea vomiting on Friday, which had resolved.  Currently no nausea, vomiting, diarrhea or abdominal pain. Daughter states that pt's urine is dark and foul-smelling over the past couple of days.   ED Course: pt was found to have WBC 4.2, pending COVID-19 PCR, positive urinalysis (hazy appearance, moderate amount of leukocyte, many bacteria, WBC 11-20), lactic acid 2.0 -->2.5, worsening renal function, temperature normal, blood pressure 183/73, heart rate 59, RR 21, oxygen saturation 100% on room air.  Chest x-ray negative.  Patient is placed on MedSurg bed for observation.  Review of Systems: Could not be reviewed accurately due to altered mental status.  Allergy:  Allergies  Allergen Reactions  . Darvon [Propoxyphene] Nausea And Vomiting  . Hydrocodone Nausea And Vomiting  . Janumet [Sitagliptin-Metformin Hcl] Nausea And Vomiting  . Motrin [Ibuprofen] Nausea And Vomiting  . Tramadol Nausea And Vomiting    Can tolerate this medication in small doses (takes half a pill).    Past Medical  History:  Diagnosis Date  . Cervical disc disease   . Chronic kidney disease    stage 3  . COPD (chronic obstructive pulmonary disease) (HCC)   . Cystitis   . Degenerative joint disease   . Diabetes mellitus without complication (HCC)    DM type 2  . Diabetic retinopathy associated with type 2 diabetes mellitus, without macular edema   . Diverticulosis of colon    without mention of hemorrhage  . Gastric ulcer   . GERD (gastroesophageal reflux disease)   . Hyperlipemia   . Hypertension   . Mitral valve prolapse   . Premature atrial contraction   . Sick sinus syndrome (HCC)   . Sinoatrial node dysfunction (HCC)     Past Surgical History:  Procedure Laterality Date  . ABDOMINAL HYSTERECTOMY    . BACK SURGERY     lumbar back surgery  . COLONOSCOPY    . ESOPHAGOGASTRODUODENOSCOPY    . gastric ulcer surgery    . KIDNEY SURGERY    . pacemaker     insertion dual chamber pacemaker generator  . PARATHYROIDECTOMY    . THYROIDECTOMY, PARTIAL     lobectomy partial thyroid    Social History:  reports that she has never smoked. She has never used smokeless tobacco. She reports that she does not drink alcohol or use drugs.  Family History:  Family History  Problem Relation Age of Onset  . Breast cancer Mother 33  . Breast cancer Other   . Breast cancer Maternal Aunt  2 mat aunts     Prior to Admission medications   Medication Sig Start Date End Date Taking? Authorizing Provider  aspirin EC 81 MG tablet Take 81 mg by mouth daily.    [provider]  atorvastatin (LIPITOR) 20 MG tablet Take 20 mg by mouth daily.    [provider]  budesonide-formoterol (SYMBICORT) 160-4.5 MCG/ACT inhaler Inhale 2 puffs into the lungs 2 (two) times daily as needed.  07/17/17   [provider]  carvedilol (COREG) 3.125 MG tablet Take 3.125 mg by mouth 2 (two) times daily with a meal.     [provider]  gabapentin (NEURONTIN) 300 MG capsule Take 1  capsule (300 mg total) by mouth 2 (two) times daily. 04/23/18   Isa Rankin, MD  glipiZIDE (GLUCOTROL XL) 5 MG 24 hr tablet Take 10 mg by mouth 2 (two) times daily.     [provider]  isosorbide mononitrate (IMDUR) 60 MG 24 hr tablet Take 60 mg by mouth daily.     [provider]  losartan (COZAAR) 100 MG tablet Take 100 mg by mouth daily.  03/20/17 04/23/18  [provider]  pantoprazole (PROTONIX) 40 MG tablet Take 40 mg by mouth daily.    [provider]    Physical Exam: Vitals:   02/17/20 1300 02/17/20 1500 02/17/20 1700 02/17/20 1808  BP: (!) 190/88 (!) 183/73 (!) 199/87 (!) 201/69  Pulse: (!) 59 (!) 59  (!) 57  Resp: 15 (!) 21 19 19   Temp:      TempSrc:      SpO2: 100% 100%  99%   General: Not in acute distress HEENT:       Eyes: PERRL, EOMI, no scleral icterus.       ENT: No discharge from the ears and nose, no pharynx injection, no tonsillar enlargement.        Neck: No JVD, no bruit, no mass felt. Heme: No neck lymph node enlargement. Cardiac: S1/S2, RRR, No murmurs, No gallops or rubs. Respiratory: No rales, wheezing, rhonchi or rubs. GI: Soft, nondistended, nontender, no organomegaly, BS present. GU: No hematuria Ext: No pitting leg edema bilaterally. 2+DP/PT pulse bilaterally. Musculoskeletal: No joint deformities, No joint redness or warmth, no limitation of ROM in spin. Skin: No rashes.  Neuro: confused, knows her own name, but is not oriented to place and time, cranial nerves II-XII grossly intact, moves all extremities  Psych: Patient is not psychotic, no suicidal or hemocidal ideation.  Labs on Admission: I have personally reviewed following labs and imaging studies  CBC: Recent Labs  Lab 02/17/20 1127  WBC 4.2  NEUTROABS 2.8  HGB 12.4  HCT 38.6  MCV 81.8  PLT 152   Basic Metabolic Panel: Recent Labs  Lab 02/17/20 1127  NA 139  K 4.2  CL 105  CO2 24  GLUCOSE 206*  BUN 23  CREATININE 1.66*  CALCIUM  9.7   GFR: CrCl cannot be calculated (Unknown ideal weight.). Liver Function Tests: No results for input(s): AST, ALT, ALKPHOS, BILITOT, PROT, ALBUMIN in the last 168 hours. No results for input(s): LIPASE, AMYLASE in the last 168 hours. No results for input(s): AMMONIA in the last 168 hours. Coagulation Profile: No results for input(s): INR, PROTIME in the last 168 hours. Cardiac Enzymes: No results for input(s): CKTOTAL, CKMB, CKMBINDEX, TROPONINI in the last 168 hours. BNP (last 3 results) No results for input(s): PROBNP in the last 8760 hours. HbA1C: No results for input(s): HGBA1C in  the last 72 hours. CBG: No results for input(s): GLUCAP in the last 168 hours. Lipid Profile: No results for input(s): CHOL, HDL, LDLCALC, TRIG, CHOLHDL, LDLDIRECT in the last 72 hours. Thyroid Function Tests: No results for input(s): TSH, T4TOTAL, FREET4, T3FREE, THYROIDAB in the last 72 hours. Anemia Panel: No results for input(s): VITAMINB12, FOLATE, FERRITIN, TIBC, IRON, RETICCTPCT in the last 72 hours. Urine analysis:    Component Value Date/Time   COLORURINE YELLOW (A) 02/17/2020 1119   APPEARANCEUR HAZY (A) 02/17/2020 1119   LABSPEC 1.017 02/17/2020 1119   PHURINE 5.0 02/17/2020 1119   GLUCOSEU NEGATIVE 02/17/2020 1119   HGBUR NEGATIVE 02/17/2020 1119   BILIRUBINUR NEGATIVE 02/17/2020 1119   KETONESUR NEGATIVE 02/17/2020 1119   PROTEINUR NEGATIVE 02/17/2020 1119   UROBILINOGEN 0.2 06/09/2007 1357   NITRITE NEGATIVE 02/17/2020 1119   LEUKOCYTESUR MODERATE (A) 02/17/2020 1119   Sepsis Labs: @LABRCNTIP (procalcitonin:4,lacticidven:4) )No results found for this or any previous visit (from the past 240 hour(s)).   Radiological Exams on Admission: DG Chest Portable 1 View  Result Date: 02/17/2020 CLINICAL DATA:  86 year old female with history of altered mental status. EXAM: PORTABLE CHEST 1 VIEW COMPARISON:  Chest x-ray 10/13/2017. FINDINGS: Lung volumes are normal. No consolidative  airspace disease. No pleural effusions. No pneumothorax. No pulmonary nodule or mass noted. Pulmonary vasculature and the cardiomediastinal silhouette are within normal limits. Aortic atherosclerosis. Left-sided pacemaker device in place with lead tips projecting over the expected location of the right atrium and right ventricle. IMPRESSION: 1.  No radiographic evidence of acute cardiopulmonary disease. 2. Aortic atherosclerosis. Electronically Signed   By: 10/15/2017 M.D.   On: 02/17/2020 11:59     EKG: Independently reviewed.Sinus QTC 484, bifascicular block, seem to have paced Marks in some leads  Assessment/Plan Principal Problem:   UTI (urinary tract infection) Active Problems:   COPD, mild (HCC)   Acute renal failure superimposed on stage 3a chronic kidney disease (HCC)   Acute metabolic encephalopathy   HTN (hypertension)   HLD (hyperlipidemia)   Type II diabetes mellitus with renal manifestations (HCC)   GERD (gastroesophageal reflux disease)   Lactic acidosis   UTI (urinary tract infection):  -place on med-surg bed for obs -IV rocephin -f/u Urine culture  Lactic acidosis: Lactic acid 2.0 --> 2.5.  Patient has not had fever.  No leukocytosis.  Clinically does not seem to be septic.  Likely due to dehydration -Trend lactic acid level -IV fluid: 1 L LR, followed by 75 cc/h -Hold Lasix  COPD, mild (HCC): Stable -Bronchodilators  Acute renal failure superimposed on stage 3a chronic kidney disease (HCC): Baseline Cre is 1.2, pt's Cre is 1.66 and BUN 23 on admission. Likely due to UTI, dehydration and continuation of Lasix and Benicar - IVF: 1 L LR, followed by 75 cc/h - Follow up renal function by BMP - Avoid using renal toxic medications, hypotension and contrast dye (or carefully use) - Hold Lasix, Benicar  Acute metabolic encephalopathy: Likely due to UTI.  No focal neurologic findings on physical examination -Frequent neuro check  HTN (hypertension) -As needed  hydralazine -Hold Lasix and Benicar -Start amlodipine 10 mg daily  HLD (hyperlipidemia) -Lipitor  Type II diabetes mellitus with renal manifestations (HCC): Most recent A1c 8.5, poorly controled. Patient is taking Metformin and glipizide at home -will decrease Lantus dose from   -SSI  GERD (gastroesophageal reflux disease) -Protonix     DVT ppx: SQ Lovenox Code Status: Full code Family Communication:  Yes, patient's  Daughter is  at bedside Disposition Plan:  Anticipate discharge back to previous home environment Consults called:  none Admission status: Med-surg bed for obs   Status is: Observation The patient remains OBS appropriate and will d/c before 2 midnights. Dispo: The patient is from: Home              Anticipated d/c is to: Home              Anticipated d/c date is: 1 day              Patient currently is not medically stable to d/c.            Date of Service 02/17/2020    [facility]   If 7PM-7AM, please contact night-coverage [email or web address] 02/17/2020, 6:58 PM

## 2020-02-17 NOTE — ED Notes (Signed)
Called for dietary tray

## 2020-02-17 NOTE — Progress Notes (Signed)
Paged NP Jon Billings re: patient's latest lactic acid 5.1 up from 3.3. BP 191/69 - administered PRN hydralazine and scheduled amlodipine. Patient had one episode of large emesis and one very large, soft, dark green bowel movement. No orders received. Will continue to monitor.

## 2020-02-18 DIAGNOSIS — G9341 Metabolic encephalopathy: Secondary | ICD-10-CM | POA: Diagnosis not present

## 2020-02-18 DIAGNOSIS — N3000 Acute cystitis without hematuria: Secondary | ICD-10-CM | POA: Diagnosis not present

## 2020-02-18 DIAGNOSIS — N1831 Chronic kidney disease, stage 3a: Secondary | ICD-10-CM

## 2020-02-18 DIAGNOSIS — N179 Acute kidney failure, unspecified: Secondary | ICD-10-CM | POA: Diagnosis not present

## 2020-02-18 LAB — BASIC METABOLIC PANEL
Anion gap: 8 (ref 5–15)
BUN: 21 mg/dL (ref 8–23)
CO2: 24 mmol/L (ref 22–32)
Calcium: 9.1 mg/dL (ref 8.9–10.3)
Chloride: 107 mmol/L (ref 98–111)
Creatinine, Ser: 1.17 mg/dL — ABNORMAL HIGH (ref 0.44–1.00)
GFR calc Af Amer: 49 mL/min — ABNORMAL LOW (ref >60)
GFR calc non Af Amer: 42 mL/min — ABNORMAL LOW (ref >60)
Glucose, Bld: 124 mg/dL — ABNORMAL HIGH (ref 70–99)
Potassium: 3.6 mmol/L (ref 3.5–5.1)
Sodium: 139 mmol/L (ref 135–145)

## 2020-02-18 LAB — CBC
HCT: 34.1 % — ABNORMAL LOW (ref 36.0–46.0)
Hemoglobin: 11.3 g/dL — ABNORMAL LOW (ref 12.0–15.0)
MCH: 27 pg (ref 26.0–34.0)
MCHC: 33.1 g/dL (ref 30.0–36.0)
MCV: 81.4 fL (ref 80.0–100.0)
Platelets: 152 10*3/uL (ref 150–400)
RBC: 4.19 MIL/uL (ref 3.87–5.11)
RDW: 14.6 % (ref 11.5–15.5)
WBC: 5.3 10*3/uL (ref 4.0–10.5)
nRBC: 0 % (ref 0.0–0.2)

## 2020-02-18 LAB — SARS CORONAVIRUS 2 (TAT 6-24 HRS): SARS Coronavirus 2: NEGATIVE

## 2020-02-18 LAB — LACTIC ACID, PLASMA
Lactic Acid, Venous: 1.3 mmol/L (ref 0.5–1.9)
Lactic Acid, Venous: 2.1 mmol/L (ref 0.5–1.9)

## 2020-02-18 LAB — GLUCOSE, CAPILLARY
Glucose-Capillary: 98 mg/dL (ref 70–99)
Glucose-Capillary: 128 mg/dL — ABNORMAL HIGH (ref 70–99)

## 2020-02-18 MED ORDER — CEFDINIR 300 MG PO CAPS
300.0000 mg | ORAL_CAPSULE | Freq: Two times a day (BID) | ORAL | 0 refills | Status: DC
Start: 2020-02-19 — End: 2020-02-18

## 2020-02-18 MED ORDER — CEFDINIR 300 MG PO CAPS
300.0000 mg | ORAL_CAPSULE | Freq: Two times a day (BID) | ORAL | 0 refills | Status: AC
Start: 2020-02-19 — End: 2020-02-22

## 2020-02-18 NOTE — Progress Notes (Signed)
Discharge instructions reviewed with the patient and her daughter. IV removed. Patient sent out via wheelchair to her daughters car.

## 2020-02-18 NOTE — Hospital Course (Signed)
[name] is a 86 y.o. female with medical history significant of hypertension, hyperlipidemia, diabetes mellitus, COPD, GERD, SSS, pacemaker placement, mitral valve prolapse, gastric ulcer, CKD stage III, who presented to the ED on 02/17/20 with altered mental status.  Patient has baseline dementia and is orientated to place and person, sometimes to the time, at her baseline.  Family report several days progressive confusion, oriented to self only, and had noted darker and malodorous urine.  In the ED, afebrile, no leukocytosis, hypertensive 183.73, HR 59, RR 21, O2 sat 100% on room air.  Labs otherwise notable for renal insufficiency, worsening lactic acidosis 2.0 -> 2.5.  Chest xray negative.  UA was consistent with infection with moderate leukocytes, many bacteria, wbc's 11-20.  Admitted to hospitalist service for further management of acute encephalopathy secondary to UTI.

## 2020-02-18 NOTE — Progress Notes (Signed)
Physical Therapy Evaluation Patient Details Name: [name] MRN: [id number] DOB: 08/11/1931 Today's Date: 02/18/2020   History of Present Illness  er MD note:[name] is a 86 y.o. female with medical history significant of hypertension, hyperlipidemia, diabetes mellitus, COPD, GERD, SSS, pacemaker placement, mitral valve prolapse, gastric ulcer, CKD stage III, who presents with altered mental status.  Clinical Impression  Patient is lying in bed with daugher present in room. She reports no pain. She is MI for bed mobility supine <> sit. She needs several attempts for sit to stand transfer due to blue mat under her feet. She stands for several minutes without static standing balance deficits. She ambulates 100 feet with RW with VC for getting closer to the AD. She is able to turn and navigate RW with MI. She has 3/5 strength BLE hip flex and knee extension. She has no skilled PT needs at this time and will be DC from PT.     Follow Up Recommendations No PT follow up    Equipment Recommendations  None recommended by PT    Recommendations for Other Services       Precautions / Restrictions Restrictions Weight Bearing Restrictions: No      Mobility  Bed Mobility Overal bed mobility: Modified Independent                Transfers Overall transfer level: Modified independent Equipment used: Rolling walker (2 wheeled)             General transfer comment: needs VC for safety  Ambulation/Gait Ambulation/Gait assistance: Modified independent (Device/Increase time) Gait Distance (Feet): 100 Feet Assistive device: Rolling walker (2 wheeled) Gait Pattern/deviations: Step-to pattern     General Gait Details: (decreased gait speed)  Stairs            Wheelchair Mobility    Modified Rankin (Stroke Patients Only)       Balance Overall balance assessment: Mild deficits observed, not formally tested                                            Pertinent Vitals/Pain Pain Assessment: No/denies pain    Home Living Family/patient expects to be discharged to:: Private residence Living Arrangements: Children Available Help at Discharge: Family Type of Home: House Home Access: Stairs to enter Entrance Stairs-Rails: Right Entrance Stairs-Number of Steps: 1 Home Layout: One level Home Equipment: Environmental consultant - 2 wheels;Bedside commode      Prior Function Level of Independence: Independent with assistive device(s)               Hand Dominance        Extremity/Trunk Assessment   Upper Extremity Assessment Upper Extremity Assessment: Overall WFL for tasks assessed    Lower Extremity Assessment Lower Extremity Assessment: Overall WFL for tasks assessed       Communication   Communication: No difficulties  Cognition Arousal/Alertness: Awake/alert Behavior During Therapy: WFL for tasks assessed/performed Overall Cognitive Status: Within Functional Limits for tasks assessed                                        General Comments      Exercises     Assessment/Plan    PT Assessment Patent does not need any further PT services  PT  Problem List         PT Treatment Interventions      PT Goals (Current goals can be found in the Care Plan section)  Acute Rehab PT Goals Patient Stated Goal: no goals stated PT Goal Formulation: All assessment and education complete, DC therapy    Frequency     Barriers to discharge        Co-evaluation               AM-PAC PT "6 Clicks" Mobility  Outcome Measure Help needed turning from your back to your side while in a flat bed without using bedrails?: None Help needed moving from lying on your back to sitting on the side of a flat bed without using bedrails?: None Help needed moving to and from a bed to a chair (including a wheelchair)?: A Little Help needed standing up from a chair using your arms (e.g., wheelchair or bedside  chair)?: A Little Help needed to walk in hospital room?: A Little Help needed climbing 3-5 steps with a railing? : A Little 6 Click Score: 20    End of Session Equipment Utilized During Treatment: Gait belt Activity Tolerance: Patient tolerated treatment well Patient left: in bed;with bed alarm set   PT Visit Diagnosis: Difficulty in walking, not elsewhere classified (R26.2)    Time: 1130-1145 PT Time Calculation (min) (ACUTE ONLY): 15 min   Charges:   PT Evaluation $PT Eval Low Complexity: 1 Low            Bowles, PT DPT 02/18/2020, 12:26 PM

## 2020-02-18 NOTE — Discharge Instructions (Signed)
Metformin - was held during admission and on discharge due to kidney function and mildly elevated lactic acid.   Primary care will resume metformin if appropriate, after follow up lab work can be done.

## 2020-02-18 NOTE — Discharge Summary (Signed)
Physician Discharge Summary  [name] NLZ:[id number] DOB: November 26, 1930 DOA: 02/17/2020  PCP: [name], MD  Admit date: 02/17/2020 Discharge date: 02/18/2020  Admitted From: home Disposition:  home  Recommendations for Outpatient Follow-up:  1. Follow up with PCP in 1-2 weeks 2. Please obtain BMP/CBC in one week 3. Patient's metformin was held due to mildly worsened renal function and lactic acidosis.  Please resume if appropriate after repeat labs are done in follow.   Home Health: No  Equipment/Devices: None   Discharge Condition: Stable  CODE STATUS: Full  Diet recommendation: Heart Healthy / Carb Modified   Discharge Diagnoses: Principal Problem:   UTI (urinary tract infection) Active Problems:   Acute metabolic encephalopathy   Lactic acidosis   COPD, mild (HCC)   Acute renal failure superimposed on stage 3a chronic kidney disease (HCC)   HTN (hypertension)   Type II diabetes mellitus with renal manifestations (HCC)   HLD (hyperlipidemia)   GERD (gastroesophageal reflux disease)    Summary of HPI and Hospital Course:  [name] is a 86 y.o. female with medical history significant of hypertension, hyperlipidemia, diabetes mellitus, COPD, GERD, SSS, pacemaker placement, mitral valve prolapse, gastric ulcer, CKD stage III, who presented to the ED on 02/17/20 with altered mental status.  Patient has baseline dementia and is orientated to place and person, sometimes to the time, at her baseline.  Family report several days progressive confusion, oriented to self only, and had noted darker and malodorous urine.  In the ED, afebrile, no leukocytosis, hypertensive 183.73, HR 59, RR 21, O2 sat 100% on room air.  Labs otherwise notable for renal insufficiency, worsening lactic acidosis 2.0 -> 2.5.  Chest xray negative.  UA was consistent with infection with moderate leukocytes, many bacteria, wbc's 11-20.  Admitted to hospitalist service for further management  of acute encephalopathy secondary to UTI.   Patient was treated during admission with IV Rocephin.  She clinically improved overnight and mental status was back to baseline the next morning.  Patient was evaluated by physical therapy prior to discharge and found to be at her functional baseline, without any needs for home health PT or equipment.  Patient's daughter was at bedside this morning, requested for discharge home.  She agrees to monitor patient closely.  Patient discharged with oral empiric Omnicef since culture data not back yet.  Daughter advised if culture returns with resistant bacteria, will call them to notify and call in different antibiotic.  She expressed understanding and agreement with the plan.    Metformin was held due to AKI and lactic acidosis.  Primary care to resume in follow up.   Discharge Instructions   Discharge Instructions    Call MD for:  extreme fatigue   Complete by: As directed    Call MD for:  persistant nausea and vomiting   Complete by: As directed    Call MD for:  severe uncontrolled pain   Complete by: As directed    Call MD for:  temperature >100.4   Complete by: As directed    Diet - low sodium heart healthy   Complete by: As directed    Discharge instructions   Complete by: As directed    Take antibiotics for 3 more days for urinary tract infection. Follow up with primary care within 1-2 weeks for follow up and repeat lab work.   Increase activity slowly   Complete by: As directed      Allergies as of 02/18/2020  Reactions   Darvon [propoxyphene] Nausea And Vomiting   Hydrocodone Nausea And Vomiting   Janumet [sitagliptin-metformin Hcl] Nausea And Vomiting   Motrin [ibuprofen] Nausea And Vomiting   Tramadol Nausea And Vomiting   Can tolerate this medication in small doses (takes half a pill).      Medication List    STOP taking these medications   metFORMIN 500 MG 24 hr tablet Commonly known as: GLUCOPHAGE-XR     TAKE these  medications   acetaminophen 500 MG tablet Commonly known as: TYLENOL Take by mouth.   aspirin EC 81 MG tablet Take 81 mg by mouth daily.   atorvastatin 40 MG tablet Commonly known as: LIPITOR Take 40 mg by mouth daily.   cefdinir 300 MG capsule Commonly known as: OMNICEF Take 1 capsule (300 mg total) by mouth 2 (two) times daily for 3 days. Start taking on: February 19, 2020   clopidogrel 75 MG tablet Commonly known as: PLAVIX Take 75 mg by mouth daily.   furosemide 20 MG tablet Commonly known as: LASIX Take 20 mg by mouth daily.   gentamicin 0.3 % ophthalmic solution Commonly known as: GARAMYCIN   ipratropium 0.03 % nasal spray Commonly known as: ATROVENT   isosorbide mononitrate 60 MG 24 hr tablet Commonly known as: IMDUR Take 60 mg by mouth daily.   olmesartan 20 MG tablet Commonly known as: BENICAR Take 20 mg by mouth daily.   Symbicort 160-4.5 MCG/ACT inhaler Generic drug: budesonide-formoterol Inhale 2 puffs into the lungs 2 (two) times daily as needed.   vitamin B-12 500 MCG tablet Commonly known as: CYANOCOBALAMIN Take 1,000 mcg by mouth daily.   Vitron-C 65-125 MG Tabs Generic drug: Iron-Vitamin C Take by mouth.       Allergies  Allergen Reactions  . Darvon [Propoxyphene] Nausea And Vomiting  . Hydrocodone Nausea And Vomiting  . Janumet [Sitagliptin-Metformin Hcl] Nausea And Vomiting  . Motrin [Ibuprofen] Nausea And Vomiting  . Tramadol Nausea And Vomiting    Can tolerate this medication in small doses (takes half a pill).    Consultations:  None    Procedures/Studies: DG Chest Portable 1 View  Result Date: 02/17/2020 CLINICAL DATA:  86 year old female with history of altered mental status. EXAM: PORTABLE CHEST 1 VIEW COMPARISON:  Chest x-ray 10/13/2017. FINDINGS: Lung volumes are normal. No consolidative airspace disease. No pleural effusions. No pneumothorax. No pulmonary nodule or mass noted. Pulmonary vasculature and the  cardiomediastinal silhouette are within normal limits. Aortic atherosclerosis. Left-sided pacemaker device in place with lead tips projecting over the expected location of the right atrium and right ventricle. IMPRESSION: 1.  No radiographic evidence of acute cardiopulmonary disease. 2. Aortic atherosclerosis. Electronically Signed   By: Trudie Reed M.D.   On: 02/17/2020 11:59       Subjective: Patient seen with daughter at bedside.  No acute events.  Patient able to tell me her name, the town and place, which is her baseline.  She denies feeling sick or having pain or discomfort.  Daughter reports patient ambulates well with cane at home, but agreeable to PT evaluation prior to discharge.    Discharge Exam: Vitals:   02/18/20 0620 02/18/20 0902  BP: (!) 115/52 131/62  Pulse: 60 60  Resp: 18   Temp: 98.6 F (37 C)   SpO2: 99%    Vitals:   02/17/20 2256 02/18/20 0033 02/18/20 0620 02/18/20 0902  BP: (!) 174/71 (!) 108/44 (!) 115/52 131/62  Pulse: 66 67 60 60  Resp:  18 18   Temp:  98.5 F (36.9 C) 98.6 F (37 C)   TempSrc:  Oral Oral   SpO2:  100% 99%   Weight:      Height:        General: Pt is alert, awake, not in acute distress Cardiovascular: RRR, S1/S2 +, no rubs, no gallops Respiratory: CTA bilaterally, no wheezing, no rhonchi Abdominal: Soft, NT, ND, bowel sounds + Extremities: no edema, no cyanosis    The results of significant diagnostics from this hospitalization (including imaging, microbiology, ancillary and laboratory) are listed below for reference.     Microbiology: Recent Results (from the past 240 hour(s))  SARS CORONAVIRUS 2 (TAT 6-24 HRS) Nasopharyngeal Nasopharyngeal Swab     Status: None   Collection Time: 02/17/20  3:24 PM   Specimen: Nasopharyngeal Swab  Result Value Ref Range Status   SARS Coronavirus 2 NEGATIVE NEGATIVE Final    Comment: (NOTE) SARS-CoV-2 target nucleic acids are NOT DETECTED. The SARS-CoV-2 RNA is generally detectable  in upper and lower respiratory specimens during the acute phase of infection. Negative results do not preclude SARS-CoV-2 infection, do not rule out co-infections with other pathogens, and should not be used as the sole basis for treatment or other patient management decisions. Negative results must be combined with clinical observations, patient history, and epidemiological information. The expected result is Negative. Fact Sheet for Patients: SugarRoll.be Fact Sheet for Healthcare Providers: https://www.woods-mathews.com/ This test is not yet approved or cleared by the [place] FDA and  has been authorized for detection and/or diagnosis of SARS-CoV-2 by FDA under an Emergency Use Authorization (EUA). This EUA will remain  in effect (meaning this test can be used) for the duration of the COVID-19 declaration under Section 56 4(b)(1) of the Act, 21 U.S.C. section 360bbb-3(b)(1), unless the authorization is terminated or revoked sooner. Performed at [facility], [place] [address], [place], [place] [address]      Labs: BNP (last 3 results) No results for input(s): BNP in the last 8760 hours. Basic Metabolic Panel: Recent Labs  Lab 02/17/20 1127 02/18/20 0508  NA 139 139  K 4.2 3.6  CL 105 107  CO2 24 24  GLUCOSE 206* 124*  BUN 23 21  CREATININE 1.66* 1.17*  CALCIUM 9.7 9.1  MG 1.8  --    Liver Function Tests: No results for input(s): AST, ALT, ALKPHOS, BILITOT, PROT, ALBUMIN in the last 168 hours. No results for input(s): LIPASE, AMYLASE in the last 168 hours. No results for input(s): AMMONIA in the last 168 hours. CBC: Recent Labs  Lab 02/17/20 1127 02/18/20 0508  WBC 4.2 5.3  NEUTROABS 2.8  --   HGB 12.4 11.3*  HCT 38.6 34.1*  MCV 81.8 81.4  PLT 152 152   Cardiac Enzymes: No results for input(s): CKTOTAL, CKMB, CKMBINDEX, TROPONINI in the last 168 hours. BNP: Invalid input(s): POCBNP CBG: Recent Labs  Lab  02/17/20 2133 02/18/20 0753  GLUCAP 114* 98   D-Dimer No results for input(s): DDIMER in the last 72 hours. Hgb A1c Recent Labs    02/17/20 1127  HGBA1C 6.3*   Lipid Profile No results for input(s): CHOL, HDL, LDLCALC, TRIG, CHOLHDL, LDLDIRECT in the last 72 hours. Thyroid function studies No results for input(s): TSH, T4TOTAL, T3FREE, THYROIDAB in the last 72 hours.  Invalid input(s): FREET3 Anemia work up No results for input(s): VITAMINB12, FOLATE, FERRITIN, TIBC, IRON, RETICCTPCT in the last 72 hours. Urinalysis    Component Value Date/Time  COLORURINE YELLOW (A) 02/17/2020 1119   APPEARANCEUR HAZY (A) 02/17/2020 1119   LABSPEC 1.017 02/17/2020 1119   PHURINE 5.0 02/17/2020 1119   GLUCOSEU NEGATIVE 02/17/2020 1119   HGBUR NEGATIVE 02/17/2020 1119   BILIRUBINUR NEGATIVE 02/17/2020 1119   KETONESUR NEGATIVE 02/17/2020 1119   PROTEINUR NEGATIVE 02/17/2020 1119   UROBILINOGEN 0.2 06/09/2007 1357   NITRITE NEGATIVE 02/17/2020 1119   LEUKOCYTESUR MODERATE (A) 02/17/2020 1119   Sepsis Labs Invalid input(s): PROCALCITONIN,  WBC,  LACTICIDVEN Microbiology Recent Results (from the past 240 hour(s))  SARS CORONAVIRUS 2 (TAT 6-24 HRS) Nasopharyngeal Nasopharyngeal Swab     Status: None   Collection Time: 02/17/20  3:24 PM   Specimen: Nasopharyngeal Swab  Result Value Ref Range Status   SARS Coronavirus 2 NEGATIVE NEGATIVE Final    Comment: (NOTE) SARS-CoV-2 target nucleic acids are NOT DETECTED. The SARS-CoV-2 RNA is generally detectable in upper and lower respiratory specimens during the acute phase of infection. Negative results do not preclude SARS-CoV-2 infection, do not rule out co-infections with other pathogens, and should not be used as the sole basis for treatment or other patient management decisions. Negative results must be combined with clinical observations, patient history, and epidemiological information. The expected result is Negative. Fact Sheet for  Patients: HairSlick.no Fact Sheet for Healthcare Providers: quierodirigir.com This test is not yet approved or cleared by the [place] FDA and  has been authorized for detection and/or diagnosis of SARS-CoV-2 by FDA under an Emergency Use Authorization (EUA). This EUA will remain  in effect (meaning this test can be used) for the duration of the COVID-19 declaration under Section 56 4(b)(1) of the Act, 21 U.S.C. section 360bbb-3(b)(1), unless the authorization is terminated or revoked sooner. Performed at [facility], [address], [place], [place] [address]      Time coordinating discharge: Over 30 minutes  SIGNED:   Pennie Banter, DO [facility] 02/18/2020, 12:07 PM   If 7PM-7AM, please contact night-coverage [email or web address]

## 2020-02-18 NOTE — Progress Notes (Signed)
Lactic acid is 2.1. Dr Denton Lank informed

## 2020-02-19 LAB — URINE CULTURE: Culture: 80000 — AB

## 2020-05-09 ENCOUNTER — Other Ambulatory Visit: Payer: Self-pay

## 2020-05-09 ENCOUNTER — Emergency Department: Payer: Medicare Other

## 2020-05-09 ENCOUNTER — Emergency Department
Admission: EM | Admit: 2020-05-09 | Discharge: 2020-05-09 | Disposition: A | Discharge status: Home / Self Care | Payer: Medicare Other | Attending: Emergency Medicine | Admitting: Emergency Medicine

## 2020-05-09 DIAGNOSIS — J449 Chronic obstructive pulmonary disease, unspecified: Secondary | ICD-10-CM | POA: Insufficient documentation

## 2020-05-09 DIAGNOSIS — E11319 Type 2 diabetes mellitus with unspecified diabetic retinopathy without macular edema: Secondary | ICD-10-CM | POA: Insufficient documentation

## 2020-05-09 DIAGNOSIS — E785 Hyperlipidemia, unspecified: Secondary | ICD-10-CM | POA: Diagnosis not present

## 2020-05-09 DIAGNOSIS — Z950 Presence of cardiac pacemaker: Secondary | ICD-10-CM | POA: Insufficient documentation

## 2020-05-09 DIAGNOSIS — I129 Hypertensive chronic kidney disease with stage 1 through stage 4 chronic kidney disease, or unspecified chronic kidney disease: Secondary | ICD-10-CM | POA: Insufficient documentation

## 2020-05-09 DIAGNOSIS — W010XXA Fall on same level from slipping, tripping and stumbling without subsequent striking against object, initial encounter: Secondary | ICD-10-CM | POA: Diagnosis not present

## 2020-05-09 DIAGNOSIS — N1831 Chronic kidney disease, stage 3a: Secondary | ICD-10-CM | POA: Insufficient documentation

## 2020-05-09 DIAGNOSIS — M79661 Pain in right lower leg: Secondary | ICD-10-CM | POA: Diagnosis not present

## 2020-05-09 DIAGNOSIS — Z79899 Other long term (current) drug therapy: Secondary | ICD-10-CM | POA: Insufficient documentation

## 2020-05-09 DIAGNOSIS — E1122 Type 2 diabetes mellitus with diabetic chronic kidney disease: Secondary | ICD-10-CM | POA: Insufficient documentation

## 2020-05-09 DIAGNOSIS — W19XXXA Unspecified fall, initial encounter: Secondary | ICD-10-CM

## 2020-05-09 DIAGNOSIS — R600 Localized edema: Secondary | ICD-10-CM | POA: Insufficient documentation

## 2020-05-09 LAB — GLUCOSE, CAPILLARY: Glucose-Capillary: 195 mg/dL — ABNORMAL HIGH (ref 70–99)

## 2020-05-09 NOTE — ED Triage Notes (Signed)
Pt reports mechanical fall this AM, pain to right leg that has continued since fall. No obvious injuries. Did not hit head, no LOC.

## 2020-05-09 NOTE — ED Notes (Signed)
See triage note  Presents s/p fall this am   Having pain to right hip and leg

## 2020-05-09 NOTE — ED Notes (Signed)
cbg 195

## 2020-05-09 NOTE — ED Triage Notes (Signed)
Pt in via EMS from home with c/o mechanical fall at 0400 this am. Pt went back to sleep but has had some pain to right leg. No deformities or swelling noted.

## 2020-05-10 NOTE — ED Provider Notes (Signed)
[facility] Emergency Department Provider Note ____________________________________________  Time seen: Approximately 9:57 AM  I have reviewed the triage vital signs and the nursing notes.   HISTORY  Chief Complaint Fall    HPI Danniela Madiha Bambrick is a 86 y.o. female who presents to the emergency department for evaluation and treatment of right leg pain after mechanical, non-syncopal fall at 4am. She got up to go to the bathroom without using her walker and tripped. She denies striking her head or losing consciousness. She got back into bed and went back to sleep. Daughter decided to have her come to the ER "just to be checked out." Per daughter, patient has been acting normal and has no confusion. She has been able to ambulate with her walker since the fall at 4am.   Past Medical History:  Diagnosis Date  . Cervical disc disease   . Chronic kidney disease    stage 3  . COPD (chronic obstructive pulmonary disease) (HCC)   . Cystitis   . Degenerative joint disease   . Diabetes mellitus without complication (HCC)    DM type 2  . Diabetic retinopathy associated with type 2 diabetes mellitus, without macular edema   . Diverticulosis of colon    without mention of hemorrhage  . Gastric ulcer   . GERD (gastroesophageal reflux disease)   . Hyperlipemia   . Hypertension   . Mitral valve prolapse   . Premature atrial contraction   . Sick sinus syndrome (HCC)   . Sinoatrial node dysfunction [facility])     Patient Active Problem List   Diagnosis Date Noted  . Acute metabolic encephalopathy 02/17/2020  . UTI (urinary tract infection) 02/17/2020  . HTN (hypertension) 02/17/2020  . HLD (hyperlipidemia) 02/17/2020  . Type II diabetes mellitus with renal manifestations (HCC) 02/17/2020  . GERD (gastroesophageal reflux disease) 02/17/2020  . Lactic acidosis 02/17/2020  . History of peptic ulcer disease 09/16/2017  . Gait instability 09/16/2017  . Obesity (BMI  30-39.9) 03/10/2017  . Trochanteric bursitis of left hip 03/10/2017  . Acute renal failure superimposed on stage 3a chronic kidney disease (HCC) 12/31/2016  . Moderate tricuspid insufficiency 12/31/2016  . B12 deficiency 05/20/2016  . Moderate mitral insufficiency 12/25/2015  . Bilateral carotid artery stenosis 11/27/2015  . Chronic venous insufficiency 10/09/2015  . LVH (left ventricular hypertrophy) due to hypertensive disease, with heart failure (HCC) 10/09/2015  . Controlled type 2 diabetes mellitus with stage 3 chronic kidney disease (HCC) 04/06/2015  . Edema leg 04/05/2015  . Hypertensive left ventricular hypertrophy 04/05/2015  . DDD (degenerative disc disease), lumbar 12/13/2014  . Neuritis or radiculitis due to rupture of lumbar intervertebral disc 12/13/2014  . Hyperlipidemia due to type 2 diabetes mellitus (HCC) 08/15/2014  . COPD, mild (HCC) 08/14/2014  . Incomplete bladder emptying 07/05/2014  . Neuralgia neuritis, sciatic nerve 07/05/2014  . Urge incontinence 07/05/2014  . FOM (frequency of micturition) 07/05/2014  . Benign essential HTN 06/14/2014  . Lumbar canal stenosis 06/14/2014  . SA node dysfunction (HCC) 06/14/2014  . Hypertension associated with diabetes (HCC) 06/14/2014    Past Surgical History:  Procedure Laterality Date  . ABDOMINAL HYSTERECTOMY    . BACK SURGERY     lumbar back surgery  . COLONOSCOPY    . ESOPHAGOGASTRODUODENOSCOPY    . gastric ulcer surgery    . KIDNEY SURGERY    . pacemaker     insertion dual chamber pacemaker generator  . PARATHYROIDECTOMY    . THYROIDECTOMY, PARTIAL  lobectomy partial thyroid    Prior to Admission medications   Medication Sig Start Date End Date Taking? Authorizing Provider  acetaminophen (TYLENOL) 500 MG tablet Take by mouth.    [provider]  aspirin EC 81 MG tablet Take 81 mg by mouth daily.    [provider]  atorvastatin (LIPITOR) 40 MG tablet Take 40 mg by mouth daily.      [provider]  budesonide-formoterol (SYMBICORT) 160-4.5 MCG/ACT inhaler Inhale 2 puffs into the lungs 2 (two) times daily as needed.  07/17/17   [provider]  clopidogrel (PLAVIX) 75 MG tablet Take 75 mg by mouth daily. 02/17/20   [provider]  furosemide (LASIX) 20 MG tablet Take 20 mg by mouth daily. 10/29/19   [provider]  gentamicin (GARAMYCIN) 0.3 % ophthalmic solution  12/01/19   [provider]  ipratropium (ATROVENT) 0.03 % nasal spray  11/16/19   [provider]  Iron-Vitamin C (VITRON-C) 65-125 MG TABS Take by mouth. 10/23/17   [provider]  isosorbide mononitrate (IMDUR) 60 MG 24 hr tablet Take 60 mg by mouth daily.     [provider]  olmesartan (BENICAR) 20 MG tablet Take 20 mg by mouth daily. 02/17/20   [provider]  vitamin B-12 (CYANOCOBALAMIN) 500 MCG tablet Take 1,000 mcg by mouth daily.     [provider]    Allergies Darvon [propoxyphene], Hydrocodone, Janumet [sitagliptin-metformin hcl], Motrin [ibuprofen], and Tramadol  Family History  Problem Relation Age of Onset  . Breast cancer Mother 85  . Breast cancer Other   . Breast cancer Maternal Aunt        2 mat aunts    Social History Social History   Tobacco Use  . Smoking status: Never Smoker  . Smokeless tobacco: Never Used  Vaping Use  . Vaping Use: Never used  Substance Use Topics  . Alcohol use: No    Alcohol/week: 0.0 standard drinks  . Drug use: No    Review of Systems Constitutional: Negative for fever. Cardiovascular: Negative for chest pain. Respiratory: Negative for shortness of breath. Musculoskeletal: Positive for right lower extremity pain. Skin: Positive for abrasion   Neurological: Negative for decrease in sensation  ____________________________________________   PHYSICAL EXAM:  VITAL SIGNS: ED Triage Vitals  Enc Vitals Group     BP 05/09/20 1135 (!) 160/84     Pulse Rate  05/09/20 1135 64     Resp 05/09/20 1135 16     Temp 05/09/20 1135 98.7 F (37.1 C)     Temp Source 05/09/20 1135 Oral     SpO2 05/09/20 1135 97 %     Weight 05/09/20 1136 137 lb (62.1 kg)     Height 05/09/20 1136 5\' 4"  (1.626 m)     Head Circumference --      Peak Flow --      Pain Score 05/09/20 1142 5     Pain Loc --      Pain Edu? --      Excl. in GC? --     Constitutional: Alert and oriented. Well appearing and in no acute distress. Eyes: Conjunctivae are clear without discharge or drainage Head: Atraumatic Neck: Supple. No midline tenderness. Respiratory: No cough. Respirations are even and unlabored. Musculoskeletal: Demonstrates FROM of extremities.  Neurologic: Awake, alert, oriented x 4. Observed ambulating with assistance. Gait steady.  Skin: Superficial abrasion to pretibial aspect of right lower extremity. Psychiatric: Affect and behavior are appropriate.  ____________________________________________   LABS (all labs ordered are listed, but only abnormal results are displayed)  Labs Reviewed  GLUCOSE, CAPILLARY - Abnormal; Notable for the following components:      Result Value   Glucose-Capillary 195 (*)    All other components within normal limits  CBG MONITORING, ED   ____________________________________________  RADIOLOGY  Image of the right knee and tibia/fibula negative for acute findings.  I, Kem Boroughs, personally viewed and evaluated these images (plain radiographs) as part of my medical decision making, as well as reviewing the written report by the radiologist.  DG Tibia/Fibula Right  Result Date: 05/09/2020 CLINICAL DATA:  Right leg pain after fall. EXAM: RIGHT TIBIA AND FIBULA - 2 VIEW; RIGHT KNEE - COMPLETE 4+ VIEW COMPARISON:  None. FINDINGS: No acute fracture or dislocation. Moderate medial and moderate to severe lateral compartment joint space narrowing. No joint effusion. Osteopenia. Vascular and dystrophic soft tissue calcifications.  IMPRESSION: 1. No acute osseous abnormality. 2. Moderate medial and moderate to severe lateral compartment osteoarthritis. Electronically Signed   By: Obie Dredge M.D.   On: 05/09/2020 12:39   DG Knee Complete 4 Views Right  Result Date: 05/09/2020 CLINICAL DATA:  Right leg pain after fall. EXAM: RIGHT TIBIA AND FIBULA - 2 VIEW; RIGHT KNEE - COMPLETE 4+ VIEW COMPARISON:  None. FINDINGS: No acute fracture or dislocation. Moderate medial and moderate to severe lateral compartment joint space narrowing. No joint effusion. Osteopenia. Vascular and dystrophic soft tissue calcifications. IMPRESSION: 1. No acute osseous abnormality. 2. Moderate medial and moderate to severe lateral compartment osteoarthritis. Electronically Signed   By: Obie Dredge M.D.   On: 05/09/2020 12:39   ____________________________________________   PROCEDURES  Procedures  ____________________________________________   INITIAL IMPRESSION / ASSESSMENT AND PLAN / ED COURSE  Lorraine Cimmino is a 86 y.o. who presents to the emergency department for evaluation after fall at 4am. See HPI for further details. Patient currently denies any pain. X-rays are negative and patient would like to go home. Offered head and cervical spine CT, but patient is adamant that she did not hit her head and does not have a headache. Daughter feels that she is at baseline and feels comfortable with her going home. She was advised to return to the ER if she develops concerns and is not able to see PCP.  Medications - No data to display  Pertinent labs & imaging results that were available during my care of the patient were reviewed by me and considered in my medical decision making (see chart for details).   _________________________________________   FINAL CLINICAL IMPRESSION(S) / ED DIAGNOSES  Final diagnoses:  Fall, initial encounter  Pain of right lower leg    ED Discharge Orders    None       If controlled substance  prescribed during this visit, 12 month history viewed on the [facility] prior to issuing an initial prescription for Schedule II or III opiod.   Chinita Pester, FNP 05/10/20 1011    Emily Filbert, MD 05/10/20 1118

## 2020-12-13 IMAGING — CT CT HEAD WITHOUT AND WITH CONTRAST
3 of 4 series · 15 of 47 positions shown, 18 images · IV contrast (omnipaque)
Comparison: 08/13/2018

CLINICAL DATA: Speech disturbance over the last 2 weeks.  Weakness.

EXAM:
CT HEAD WITHOUT AND WITH CONTRAST
TECHNIQUE: Contiguous axial images were obtained from the base of the skull
through the vertex without and with intravenous contrast
CONTRAST:  75mL OMNIPAQUE IOHEXOL 300 MG/ML  SOLN

[Series 2: head wo · axial · 0.47mm/px · z∈[-160,-35]mm · 9 of 31 slices shown, 12 images]
[im 3/31  brain]
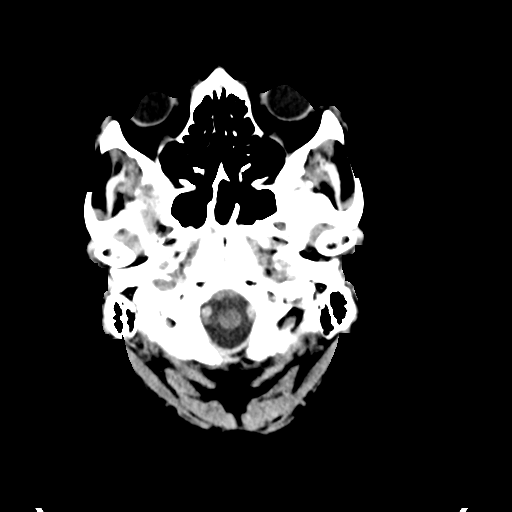
[im 3/31  bone]
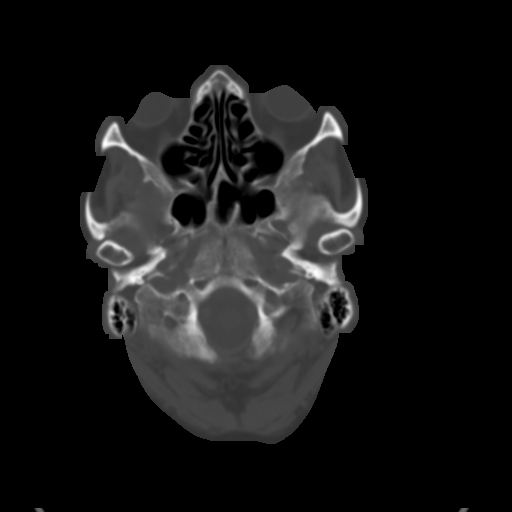
[im 7/31  brain]
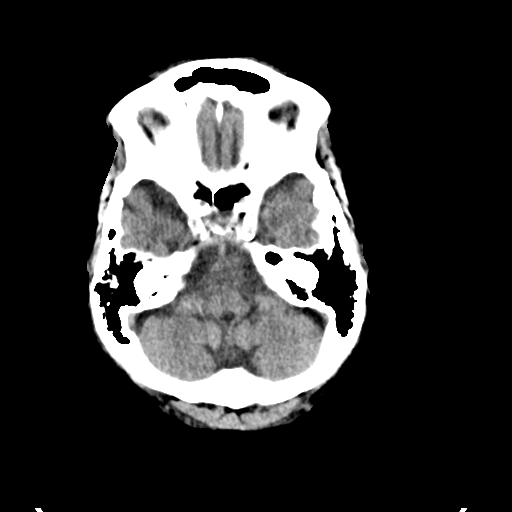
[im 9/31  brain]
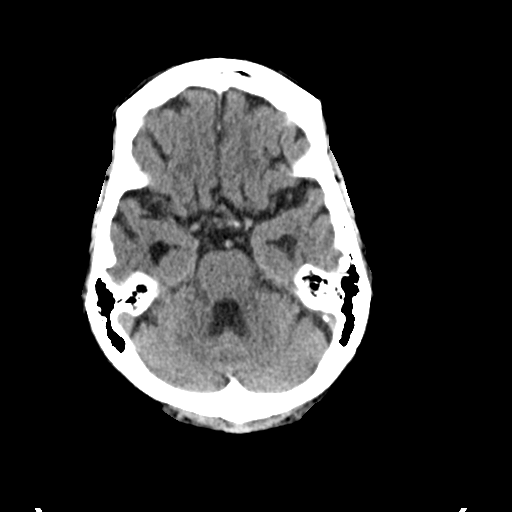
[im 13/31  brain]
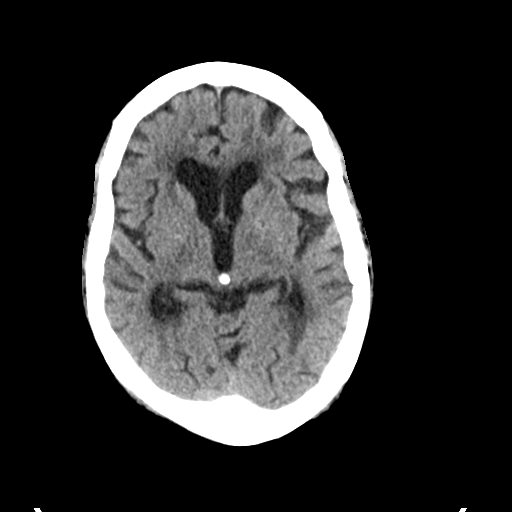
[im 16/31  brain]
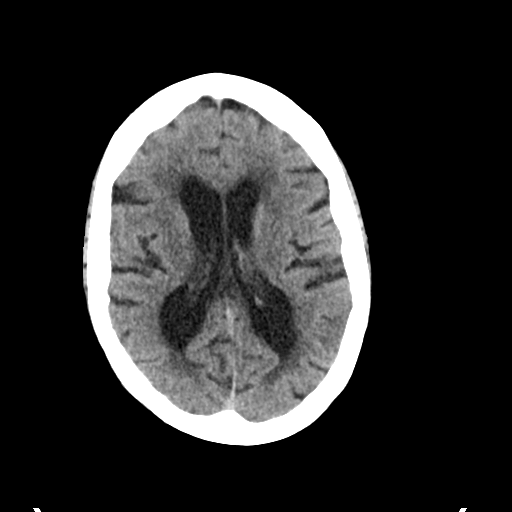
[im 16/31  bone]
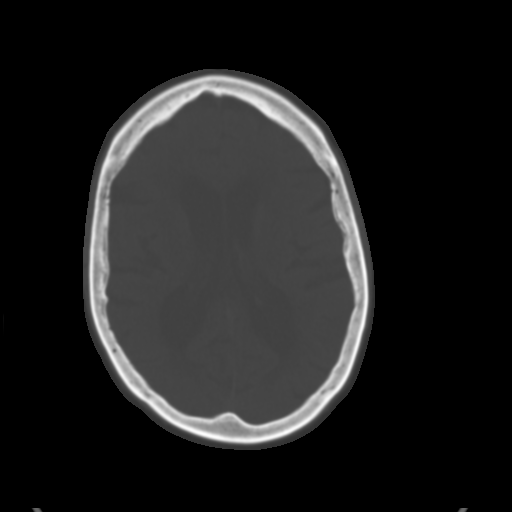
[im 18/31  brain]
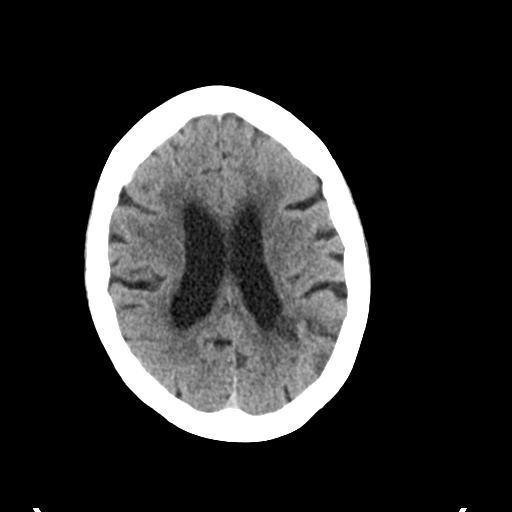
[im 22/31  brain]
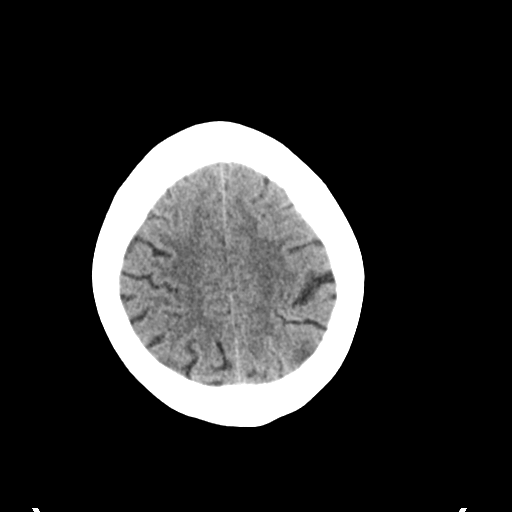
[im 24/31  brain]
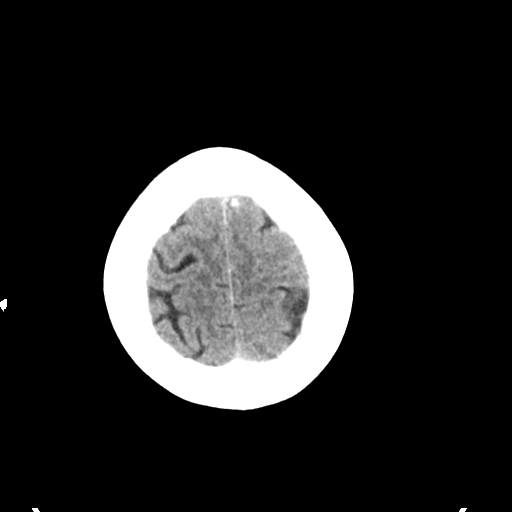
[im 28/31  brain]
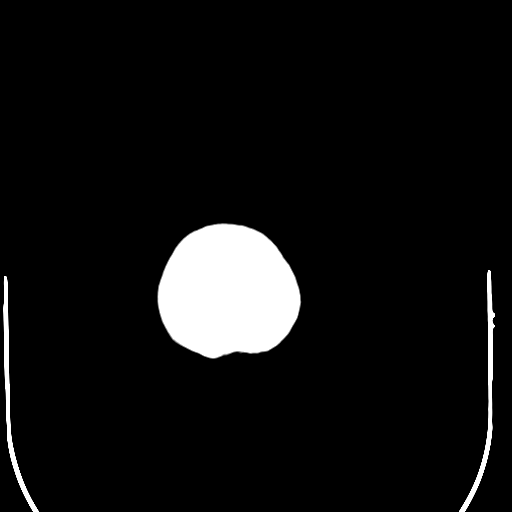
[im 28/31  bone]
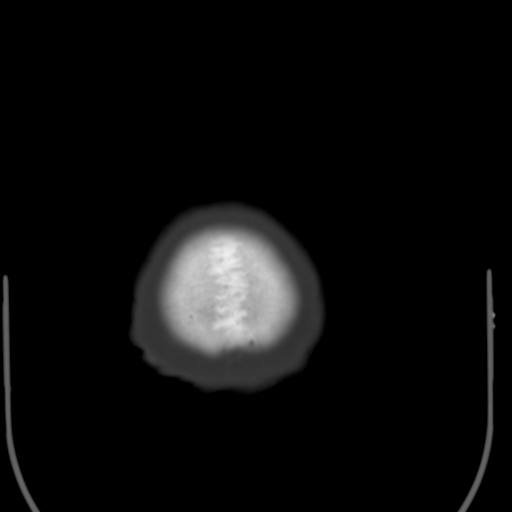

[Series 5: coronal soft tissue · coronal · 0.31mm/px · 3 of 64 slices shown]
[im 22/64  brain]
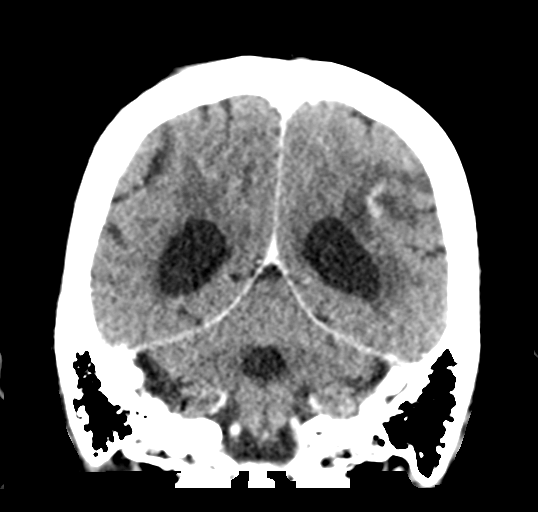
[im 29/64  brain]
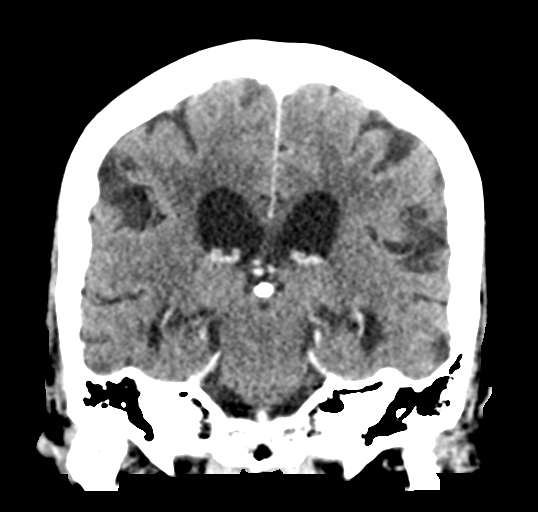
[im 36/64  brain]
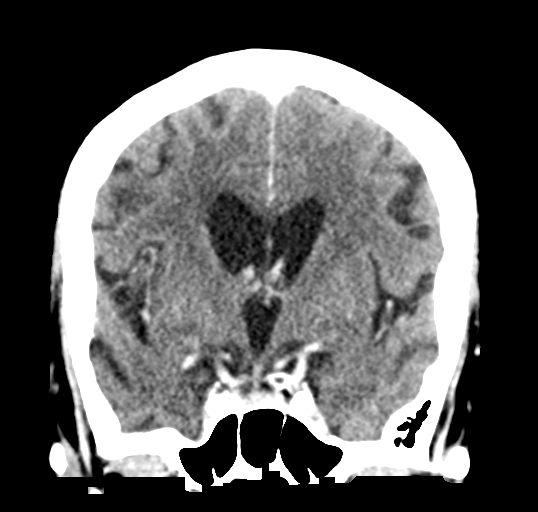

[Series 6: sagittal soft tissue · sagittal · 0.31mm/px · 3 of 55 slices shown]
[im 19/55  brain]
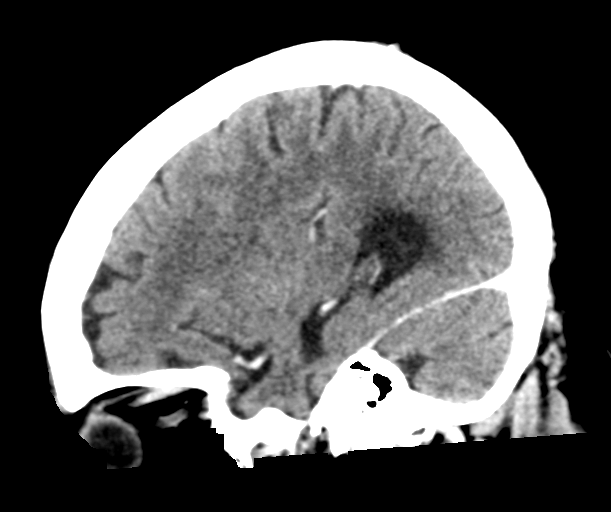
[im 28/55  brain]
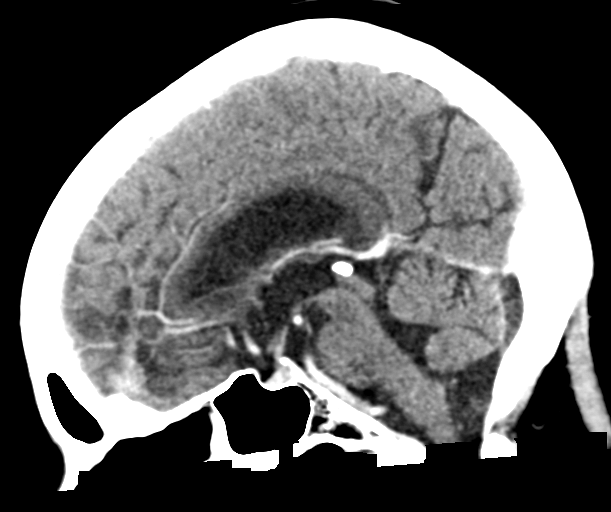
[im 37/55  brain]
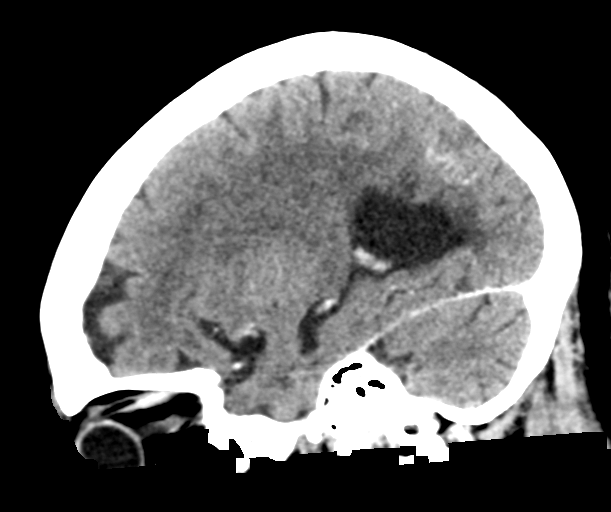

[15 of 47 positions shown; findings below may reference images not displayed]

FINDINGS: Brain: Background pattern of generalized atrophy. Chronic
small-vessel ischemic changes affect the pons. No focal cerebellar
insult. Cerebral hemispheres show chronic small-vessel ischemic
changes affecting the deep and subcortical white matter. There is a
newly seen region of cortical and subcortical infarction in the left
parietal lobe which may be subacute. Mild contrast enhancement
occurs in this region, typical of subacute infarction. No evidence
of hemorrhage or mass effect. No hydrocephalus or extra-axial
collection.

Vascular: There is atherosclerotic calcification of the major
vessels at the base of the brain.

Skull: Negative

Sinuses/Orbits: Clear/normal

Other: None
IMPRESSION: Chronic atrophy and small-vessel ischemic changes throughout the
brain. Newly seen region of low-density in the left parietal
cortical and subcortical region that probably represents a subacute
infarction. Some enhancement in this region, typical of subacute
infarction. This probably correlates with the 2 week symptom
history. No evidence of hemorrhage or mass effect.

## 2021-08-19 DIAGNOSIS — N184 Chronic kidney disease, stage 4 (severe): Secondary | ICD-10-CM | POA: Diagnosis present

## 2021-09-29 IMAGING — DX DG CHEST 1V PORT
1 series · 1 of 1 positions shown · non-contrast
Comparison: Chest x-ray 10/13/2017.

CLINICAL DATA: 88-year-old female with history of altered mental
status.

EXAM:
PORTABLE CHEST 1 VIEW

[chest ap]
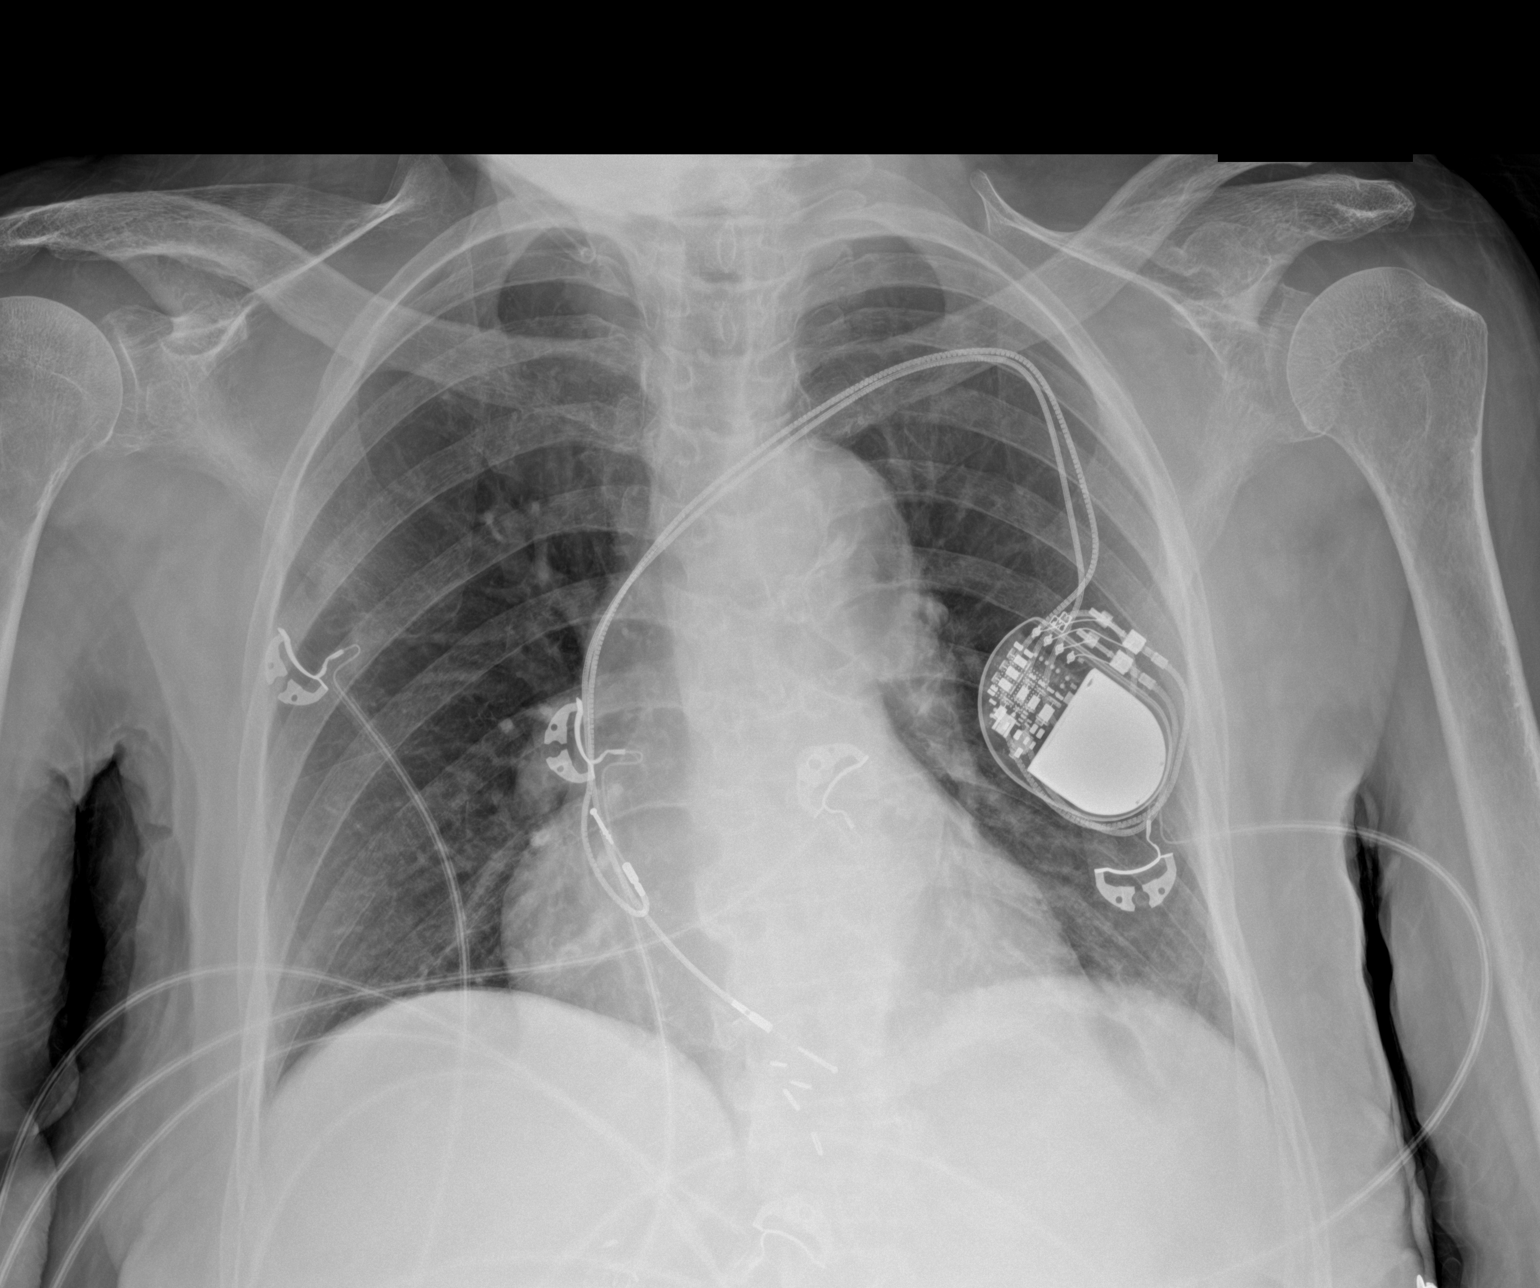

[1 of 1 positions shown; findings below may reference images not displayed]

FINDINGS: Lung volumes are normal. No consolidative airspace disease. No
pleural effusions. No pneumothorax. No pulmonary nodule or mass
noted. Pulmonary vasculature and the cardiomediastinal silhouette
are within normal limits. Aortic atherosclerosis. Left-sided
pacemaker device in place with lead tips projecting over the
expected location of the right atrium and right ventricle.
IMPRESSION: 1.  No radiographic evidence of acute cardiopulmonary disease.
2. Aortic atherosclerosis.

## 2023-01-12 ENCOUNTER — Ambulatory Visit
Admission: EM | Admit: 2023-01-12 | Discharge: 2023-01-12 | Disposition: A | Discharge status: Home / Self Care | Payer: Medicare Other | Attending: Family Medicine | Admitting: Family Medicine

## 2023-01-12 ENCOUNTER — Encounter: Payer: Self-pay | Admitting: Emergency Medicine

## 2023-01-12 DIAGNOSIS — I10 Essential (primary) hypertension: Secondary | ICD-10-CM | POA: Diagnosis not present

## 2023-01-12 DIAGNOSIS — R739 Hyperglycemia, unspecified: Secondary | ICD-10-CM

## 2023-01-12 LAB — GLUCOSE, CAPILLARY
Glucose-Capillary: 238 mg/dL — ABNORMAL HIGH (ref 70–99)
Glucose-Capillary: 225 mg/dL — ABNORMAL HIGH (ref 70–99)

## 2023-01-12 NOTE — ED Provider Notes (Signed)
[facility]    CSN: [id number] Arrival date & time: 01/12/23  1112      History   Chief Complaint Chief Complaint  Patient presents with   Hyperglycemia    HPI [name] is a 89 y.o. female.   HPI   [name] presents for hyperglycemia. Pt has diabetes and her PCP recently switched her insulin regimen. Her blood sugar was 323 this morning.  Daughter checks her blood sugar after eating.  Her daughter gives her all of her medications.  Son recently took his mom to her most recent appointment.  There are seems to have been some medication changes that her daughter was not aware of.  [name] has otherwise been in her normal state of health.       Past Medical History:  Diagnosis Date   Cervical disc disease    Chronic kidney disease    stage 3   COPD (chronic obstructive pulmonary disease) (HCC)    Cystitis    Degenerative joint disease    Diabetes mellitus without complication ([place])    DM type 2   Diabetic retinopathy associated with type 2 diabetes mellitus, without macular edema    Diverticulosis of colon    without mention of hemorrhage   Gastric ulcer    GERD (gastroesophageal reflux disease)    Hyperlipemia    Hypertension    Mitral valve prolapse    Premature atrial contraction    Sick sinus syndrome ([place])    Sinoatrial node dysfunction ([place])     Patient Active Problem List   Diagnosis Date Noted   Acute metabolic encephalopathy [id number]   UTI (urinary tract infection) 02/17/2020   HTN (hypertension) 02/17/2020   HLD (hyperlipidemia) 02/17/2020   Type II diabetes mellitus with renal manifestations ([place]) 02/17/2020   GERD (gastroesophageal reflux disease) 02/17/2020   Lactic acidosis 02/17/2020   History of peptic ulcer disease 09/16/2017   Gait instability 09/16/2017   Obesity (BMI 30-39.9) 03/10/2017   Trochanteric bursitis of left hip 03/10/2017   Acute renal failure superimposed on stage 3a chronic kidney disease ([place])  12/31/2016   Moderate tricuspid insufficiency 12/31/2016   B12 deficiency 05/20/2016   Moderate mitral insufficiency 12/25/2015   Bilateral carotid artery stenosis 11/27/2015   Chronic venous insufficiency 10/09/2015   LVH (left ventricular hypertrophy) due to hypertensive disease, with heart failure ([place]) 10/09/2015   Controlled type 2 diabetes mellitus with stage 3 chronic kidney disease ([place]) 04/06/2015   Edema leg 04/05/2015   Hypertensive left ventricular hypertrophy 04/05/2015   DDD (degenerative disc disease), lumbar 12/13/2014   Neuritis or radiculitis due to rupture of lumbar intervertebral disc 12/13/2014   Hyperlipidemia due to type 2 diabetes mellitus ([place]) 08/15/2014   COPD, mild ([place]) 08/14/2014   Incomplete bladder emptying 07/05/2014   Neuralgia neuritis, sciatic nerve 07/05/2014   Urge incontinence 07/05/2014   FOM (frequency of micturition) 07/05/2014   Benign essential HTN 06/14/2014   Lumbar canal stenosis 06/14/2014   SA node dysfunction ([place]) 06/14/2014   Hypertension associated with diabetes ([place]) 06/14/2014    Past Surgical History:  Procedure Laterality Date   ABDOMINAL HYSTERECTOMY     BACK SURGERY     lumbar back surgery   COLONOSCOPY     ESOPHAGOGASTRODUODENOSCOPY     gastric ulcer surgery     KIDNEY SURGERY     pacemaker     insertion dual chamber pacemaker generator   PARATHYROIDECTOMY     THYROIDECTOMY, PARTIAL     lobectomy  partial thyroid    OB History   No obstetric history on file.      Home Medications    Prior to Admission medications   Medication Sig Start Date End Date Taking? Authorizing Provider  acetaminophen (TYLENOL) 500 MG tablet Take by mouth.   Yes [provider]  aspirin EC 81 MG tablet Take 81 mg by mouth daily.   Yes [provider]  atorvastatin (LIPITOR) 40 MG tablet Take 40 mg by mouth daily.    Yes [provider]  clopidogrel (PLAVIX) 75 MG tablet Take 75 mg by mouth daily. 02/17/20   Yes [provider]  furosemide (LASIX) 20 MG tablet Take 20 mg by mouth daily. 10/29/19  Yes [provider]  gentamicin (GARAMYCIN) 0.3 % ophthalmic solution  12/01/19  Yes [provider]  glipiZIDE (GLUCOTROL) 5 MG tablet Take by mouth. 01/08/23 01/08/24 Yes [provider]  Iron-Vitamin C (VITRON-C) 65-125 MG TABS Take by mouth. 10/23/17  Yes [provider]  isosorbide mononitrate (IMDUR) 60 MG 24 hr tablet Take 60 mg by mouth daily.    Yes [provider]  LEVEMIR FLEXPEN 100 UNIT/ML FlexPen Inject 20 Units into the skin at bedtime. 12/09/22  Yes [provider]  olmesartan (BENICAR) 20 MG tablet Take 20 mg by mouth daily. 02/17/20  Yes [provider]  vitamin B-12 (CYANOCOBALAMIN) 500 MCG tablet Take 1,000 mcg by mouth daily.    Yes [provider]  budesonide-formoterol (SYMBICORT) 160-4.5 MCG/ACT inhaler Inhale 2 puffs into the lungs 2 (two) times daily as needed.  07/17/17   [provider]  ipratropium (ATROVENT) 0.03 % nasal spray  11/16/19   [provider]    Family History Family History  Problem Relation Age of Onset   Breast cancer Mother 31   Breast cancer Other    Breast cancer Maternal Aunt        2 mat aunts    Social History Social History   Tobacco Use   Smoking status: Never   Smokeless tobacco: Never  Vaping Use   Vaping Use: Never used  Substance Use Topics   Alcohol use: No    Alcohol/week: 0.0 standard drinks of alcohol   Drug use: No     Allergies   Darvon [propoxyphene], Hydrocodone, Janumet [sitagliptin-metformin hcl], Motrin [ibuprofen], and Tramadol   Review of Systems Review of Systems: negative unless otherwise stated in HPI.      Physical Exam Triage Vital Signs ED Triage Vitals  Enc Vitals Group     BP 01/12/23 1138 (!) 190/108     Pulse Rate 01/12/23 1134 86     Resp 01/12/23 1134 16     Temp 01/12/23 1134 98.6 F (37 C)     Temp  Source 01/12/23 1134 Oral     SpO2 01/12/23 1134 96 %     Weight --      Height --      Head Circumference --      Peak Flow --      Pain Score 01/12/23 1135 0     Pain Loc --      Pain Edu? --      Excl. in [place]? --    No data found.  Updated Vital Signs BP (!) 188/96 (BP Location: Left Arm)   Pulse 86   Temp 98.6 F (37 C) (Oral)   Resp 16   SpO2 96%   Visual Acuity Right Eye Distance:   Left  Eye Distance:   Bilateral Distance:    Right Eye Near:   Left Eye Near:    Bilateral Near:     Physical Exam GEN: pleasant well appearing elderly female, in no acute distress  CV: regular rate and rhythm RESP: no increased work of breathing, clear to ascultation bilaterally ABD: Bowel sounds present. Soft, non-tender, non-distended.  SKIN: warm, dry NEURO: alert, moves all extremities appropriately PSYCH: Normal affect, appropriate speech and behavior       UC Treatments / Results  Labs (all labs ordered are listed, but only abnormal results are displayed) Labs Reviewed  GLUCOSE, CAPILLARY - Abnormal; Notable for the following components:      Result Value   Glucose-Capillary 238 (*)    All other components within normal limits  GLUCOSE, CAPILLARY - Abnormal; Notable for the following components:   Glucose-Capillary 225 (*)    All other components within normal limits  CBG MONITORING, ED    EKG   Radiology No results found.  Procedures Procedures (including critical care time)  Medications Ordered in UC Medications - No data to display  Initial Impression / Assessment and Plan / UC Course  I have reviewed the triage vital signs and the nursing notes.  Pertinent labs & imaging results that were available during my care of the patient were reviewed by me and considered in my medical decision making (see chart for details).       Patient is a 89 y.o. female with  type 2 diabetes, hypertension, COPD, sick sinus syndrome, mitral valve prolapse, GERD,  hyperlipidemia presents for elevated blood sugar.  Overall patient is well-appearing and afebrile.    She is hypertensive, BP 190/108 and repeat 188/96.  Takes Lasix, Imdur, olmesartan.  Her medication arrives in pill packs.  Cardiopulmonary exam is unremarkable.  Her blood sugar today was 323.  Initial CBG here 238 and repeat was 225.  On chart review, it seems that she was started on Lantus 20 units from Levemir 11 units daily.  I am unsure if this was meant to be Lantus or Levemir.  Prescription box from February says 20 units of Levemir.  Patient has an upcoming appointment with her endocrinologist in a few days.  Advised daughter to take her mom's medications to this appointment and asked if patient is on Lantus or Levemir.  Attempted to get a BMP however after multiple attempts by several staff members, whey were unable to obtain blood work.  ED and return precautions given and patient/guardian voiced understanding. Discussed MDM, treatment plan and plan for follow-up with patient and her daughter who agree with plan.    Final Clinical Impressions(s) / UC Diagnoses   Final diagnoses:  Hyperglycemia  Essential hypertension     Discharge Instructions      Continue her current insulin regimen for now. Keep her upcoming appointment. It appears Dr Sabra Heck changed her to Lantus 20 u at her last visit.  Ask her endocrinologist whether she should be on Levemer or Lantus and verify the dose. Also ask whether she should continue the glipizide given her age. For now, give her glipizide when her sugars are over 200 before breakfast. Be sure to check her blood sugar before the meal. Based on A1c, her diabetes is well controlled.   Her blood pressure is quite elevated here. Write her blood pressures down when you get them first thing in the morning and give to her doctor to see if she needs to adjust her medications.  ED Prescriptions   None    PDMP not reviewed this encounter.    Lyndee Hensen, DO 01/14/23 1715

## 2023-01-12 NOTE — ED Triage Notes (Addendum)
Pt presents with her daughter who states her BS was 323 this morning after she ate. Pt denies any symptoms. After going over the patients medications with the daughter she has been giving her Levemir 11 units at bedtime and the directions states 20 units at bedtime

## 2023-01-12 NOTE — Discharge Instructions (Addendum)
Continue her current insulin regimen for now. Keep her upcoming appointment. It appears Dr Sabra Heck changed her to Lantus 20 u at her last visit.  Ask her endocrinologist whether she should be on Levemer or Lantus and verify the dose. Also ask whether she should continue the glipizide given her age. For now, give her glipizide when her sugars are over 200 before breakfast. Be sure to check her blood sugar before the meal. Based on A1c, her diabetes is well controlled.   Her blood pressure is quite elevated here. Write her blood pressures down when you get them first thing in the morning and give to her doctor to see if she needs to adjust her medications.

## 2023-03-30 ENCOUNTER — Ambulatory Visit
Admission: EM | Admit: 2023-03-30 | Discharge: 2023-03-30 | Disposition: A | Discharge status: Home / Self Care | Payer: Medicare Other | Attending: Nurse Practitioner | Admitting: Nurse Practitioner

## 2023-03-30 DIAGNOSIS — L0100 Impetigo, unspecified: Secondary | ICD-10-CM | POA: Diagnosis not present

## 2023-03-30 MED ORDER — CEPHALEXIN 500 MG PO CAPS: 500.0000 mg | ORAL_CAPSULE | Freq: Three times a day (TID) | ORAL | 0 refills | Status: AC

## 2023-03-30 MED ORDER — MUPIROCIN CALCIUM 2 % EX CREA: 1.0000 | TOPICAL_CREAM | Freq: Two times a day (BID) | CUTANEOUS | 0 refills | Status: DC

## 2023-03-30 MED ORDER — CEPHALEXIN 500 MG PO CAPS: 500.0000 mg | ORAL_CAPSULE | Freq: Three times a day (TID) | ORAL | 0 refills | Status: DC

## 2023-03-30 NOTE — Discharge Instructions (Signed)
Start Keflex 3 times a day for 7 days Mupirocin topical antibiotic ointment to the area twice daily for 7 days Follow-up with your PCP if your symptoms do not improve Please go to the ER for any worsening symptoms

## 2023-03-30 NOTE — ED Triage Notes (Signed)
Pt presents with rash around nose and mouth. Burns sometimes. Daughter is with pt and noticed rash yesterday although pt states it has been there for a week. Existing memory problems.

## 2023-03-30 NOTE — ED Provider Notes (Addendum)
[facility]    CSN: [id number] Arrival date & time: 03/30/23  1426      History   Chief Complaint Chief Complaint  Patient presents with   Rash    HPI [name] is a 89 y.o. female presents for evaluation of a skin infection.  Patient is companied by daughter.  Patient and daughter report a rash on her upper lip/next to her nares and on the outside of her nose.  States it is draining but denies any pain, swelling, warmth.  No fevers or chills.  No history of eczema or psoriasis.  No injury to the area.  No history of HSV type I.  Has not been using any OTC medications for symptoms.  No other concerns at this time.   Rash   Past Medical History:  Diagnosis Date   Cervical disc disease    Chronic kidney disease    stage 3   COPD (chronic obstructive pulmonary disease) (HCC)    Cystitis    Degenerative joint disease    Diabetes mellitus without complication (HCC)    DM type 2   Diabetic retinopathy associated with type 2 diabetes mellitus, without macular edema    Diverticulosis of colon    without mention of hemorrhage   Gastric ulcer    GERD (gastroesophageal reflux disease)    Hyperlipemia    Hypertension    Mitral valve prolapse    Premature atrial contraction    Sick sinus syndrome (HCC)    Sinoatrial node dysfunction (HCC)     Patient Active Problem List   Diagnosis Date Noted   Acute metabolic encephalopathy 02/17/2020   UTI (urinary tract infection) 02/17/2020   HTN (hypertension) 02/17/2020   HLD (hyperlipidemia) 02/17/2020   Type II diabetes mellitus with renal manifestations (HCC) 02/17/2020   GERD (gastroesophageal reflux disease) 02/17/2020   Lactic acidosis 02/17/2020   History of peptic ulcer disease 09/16/2017   Gait instability 09/16/2017   Obesity (BMI 30-39.9) 03/10/2017   Trochanteric bursitis of left hip 03/10/2017   Acute renal failure superimposed on stage 3a chronic kidney disease (HCC) 12/31/2016   Moderate  tricuspid insufficiency 12/31/2016   B12 deficiency 05/20/2016   Moderate mitral insufficiency 12/25/2015   Bilateral carotid artery stenosis 11/27/2015   Chronic venous insufficiency 10/09/2015   LVH (left ventricular hypertrophy) due to hypertensive disease, with heart failure (HCC) 10/09/2015   Controlled type 2 diabetes mellitus with stage 3 chronic kidney disease (HCC) 04/06/2015   Edema leg 04/05/2015   Hypertensive left ventricular hypertrophy 04/05/2015   DDD (degenerative disc disease), lumbar 12/13/2014   Neuritis or radiculitis due to rupture of lumbar intervertebral disc 12/13/2014   Hyperlipidemia due to type 2 diabetes mellitus (HCC) 08/15/2014   COPD, mild (HCC) 08/14/2014   Incomplete bladder emptying 07/05/2014   Neuralgia neuritis, sciatic nerve 07/05/2014   Urge incontinence 07/05/2014   FOM (frequency of micturition) 07/05/2014   Benign essential HTN 06/14/2014   Lumbar canal stenosis 06/14/2014   SA node dysfunction (HCC) 06/14/2014   Hypertension associated with diabetes (HCC) 06/14/2014    Past Surgical History:  Procedure Laterality Date   ABDOMINAL HYSTERECTOMY     BACK SURGERY     lumbar back surgery   COLONOSCOPY     ESOPHAGOGASTRODUODENOSCOPY     gastric ulcer surgery     KIDNEY SURGERY     pacemaker     insertion dual chamber pacemaker generator   PARATHYROIDECTOMY     THYROIDECTOMY, PARTIAL  lobectomy partial thyroid    OB History   No obstetric history on file.      Home Medications    Prior to Admission medications   Medication Sig Start Date End Date Taking? Authorizing Provider  acetaminophen (TYLENOL) 500 MG tablet Take by mouth.   Yes [provider]  aspirin EC 81 MG tablet Take 81 mg by mouth daily.   Yes [provider]  atorvastatin (LIPITOR) 40 MG tablet Take 40 mg by mouth daily.    Yes [provider]  clopidogrel (PLAVIX) 75 MG tablet Take 75 mg by mouth daily. 02/17/20  Yes [provider]  furosemide (LASIX) 20 MG tablet Take 20 mg by mouth daily. 10/29/19  Yes [provider]  glipiZIDE (GLUCOTROL) 5 MG tablet Take by mouth. 01/08/23 01/08/24 Yes [provider]  isosorbide mononitrate (IMDUR) 60 MG 24 hr tablet Take 60 mg by mouth daily.    Yes [provider]  LEVEMIR FLEXPEN 100 UNIT/ML FlexPen Inject 20 Units into the skin at bedtime. 12/09/22  Yes [provider]  olmesartan (BENICAR) 20 MG tablet Take 20 mg by mouth daily. 02/17/20  Yes [provider]  vitamin B-12 (CYANOCOBALAMIN) 500 MCG tablet Take 1,000 mcg by mouth daily.    Yes [provider]  budesonide-formoterol (SYMBICORT) 160-4.5 MCG/ACT inhaler Inhale 2 puffs into the lungs 2 (two) times daily as needed.  07/17/17   [provider]  cephALEXin (KEFLEX) 500 MG capsule Take 1 capsule (500 mg total) by mouth 3 (three) times daily for 7 days. 03/30/23 04/06/23  Radford Pax, NP  gentamicin (GARAMYCIN) 0.3 % ophthalmic solution  12/01/19   [provider]  ipratropium (ATROVENT) 0.03 % nasal spray  11/16/19   [provider]  Iron-Vitamin C (VITRON-C) 65-125 MG TABS Take by mouth. 10/23/17   [provider]  mupirocin cream (BACTROBAN) 2 % Apply 1 Application topically 2 (two) times daily. 03/30/23   Radford Pax, NP    Family History Family History  Problem Relation Age of Onset   Breast cancer Mother 61   Breast cancer Other    Breast cancer Maternal Aunt        2 mat aunts    Social History Social History   Tobacco Use   Smoking status: Never   Smokeless tobacco: Never  Vaping Use   Vaping Use: Never used  Substance Use Topics   Alcohol use: No    Alcohol/week: 0.0 standard drinks of alcohol   Drug use: No     Allergies   Darvon [propoxyphene], Hydrocodone, Janumet [sitagliptin-metformin hcl], Motrin [ibuprofen], and Tramadol   Review of Systems Review of Systems  Skin:  Positive for rash.      Physical Exam Triage Vital Signs ED Triage Vitals  Enc Vitals Group     BP 03/30/23 1534 (!) 166/86     Pulse Rate 03/30/23 1534 60     Resp 03/30/23 1534 16     Temp 03/30/23 1534 98.2 F (36.8 C)     Temp Source 03/30/23 1534 Oral     SpO2 03/30/23 1534 98 %     Weight --      Height --      Head Circumference --      Peak Flow --      Pain Score 03/30/23 1521 0     Pain Loc --      Pain Edu? --      Excl. in  GC? --    No data found.  Updated Vital Signs BP (!) 166/86 (BP Location: Right Arm)   Pulse 60   Temp 98.2 F (36.8 C) (Oral)   Resp 16   SpO2 98%   Visual Acuity Right Eye Distance:   Left Eye Distance:   Bilateral Distance:    Right Eye Near:   Left Eye Near:    Bilateral Near:     Physical Exam Vitals and nursing note reviewed.  Constitutional:      General: She is not in acute distress.    Appearance: Normal appearance. She is not ill-appearing, toxic-appearing or diaphoretic.  HENT:     Head: Normocephalic and atraumatic.      Comments: Honey colored crusted lesions on the upper lip next to right nares and a couple spots on the top of nose.  No swelling or active drainage.  No vesicles or lesions Eyes:     Pupils: Pupils are equal, round, and reactive to light.  Cardiovascular:     Rate and Rhythm: Normal rate.  Pulmonary:     Effort: Pulmonary effort is normal.  Skin:    General: Skin is warm and dry.  Neurological:     General: No focal deficit present.     Mental Status: She is alert and oriented to person, place, and time.  Psychiatric:        Mood and Affect: Mood normal.        Behavior: Behavior normal.      UC Treatments / Results  Labs (all labs ordered are listed, but only abnormal results are displayed) Labs Reviewed - No data to display  Comprehensive Metabolic Panel (CMP) Order: [id number] Component Ref Range & Units 2 mo ago  Glucose 70 - 110 mg/dL 782 High   Sodium 956 - 145 mmol/L 142  Potassium 3.6 -  5.1 mmol/L 4.3  Chloride 97 - 109 mmol/L 107  Carbon Dioxide (CO2) 22.0 - 32.0 mmol/L 29.1  Urea Nitrogen (BUN) 7 - 25 mg/dL 21  Creatinine 0.6 - 1.1 mg/dL 1.7 High   Glomerular Filtration Rate (eGFR) >60 mL/min/1.73sq m 28 Low   Comment: CKD-EPI (2021) does not include patient's race in the calculation of eGFR.  Monitoring changes of plasma creatinine and eGFR over time is useful for monitoring kidney function.  Interpretive Ranges for eGFR (CKD-EPI 2021):  eGFR:       >60 mL/min/1.73 sq. m - Normal eGFR:       30-59 mL/min/1.73 sq. m - Moderately Decreased eGFR:       15-29 mL/min/1.73 sq. m  - Severely Decreased eGFR:       < 15 mL/min/1.73 sq. m  - Kidney Failure   Note: These eGFR calculations do not apply in acute situations when eGFR is changing rapidly or patients on dialysis.  Calcium 8.7 - 10.3 mg/dL 9.3  AST 8 - 39 U/L 20  ALT 5 - 38 U/L 23  Alk Phos (alkaline Phosphatase) 34 - 104 U/L 75  Albumin 3.5 - 4.8 g/dL 4.2  Bilirubin, Total 0.3 - 1.2 mg/dL 0.7  Protein, Total 6.1 - 7.9 g/dL 6.7  A/G Ratio 1.0 - 5.0 gm/dL 1.7  Resulting Agency [facility]   Specimen Collected: 01/08/23 10:29   Performed by: [facility] Last Resulted: 01/08/23 12:16  Received From: [facility]  Result Received: 01/12/23 11:12     EKG   Radiology No results found.  Procedures Procedures (  including critical care time)  Medications Ordered in UC Medications - No data to display  Initial Impression / Assessment and Plan / UC Course  I have reviewed the triage vital signs and the nursing notes.  Pertinent labs & imaging results that were available during my care of the patient were reviewed by me and considered in my medical decision making (see chart for details).     Reviewed exam and symptoms with patient and daughter.  No red flags.  Discussed exam consistent with impetigo Keflex 3 times daily for 7 days.  Creatinine  clearance 22 Mupirocin topical twice daily for 7 days Keep clean and dry Follow-up with PCP if symptoms do not improve ER precautions reviewed and patient verbalized understand Final Clinical Impressions(s) / UC Diagnoses   Final diagnoses:  Impetigo     Discharge Instructions      Start Keflex 3 times a day for 7 days Mupirocin topical antibiotic ointment to the area twice daily for 7 days Follow-up with your PCP if your symptoms do not improve Please go to the ER for any worsening symptoms    ED Prescriptions     Medication Sig Dispense Auth. Provider   cephALEXin (KEFLEX) 500 MG capsule  (Status: Discontinued) Take 1 capsule (500 mg total) by mouth 3 (three) times daily for 7 days. 21 capsule Radford Pax, NP   mupirocin cream (BACTROBAN) 2 %  (Status: Discontinued) Apply 1 Application topically 2 (two) times daily. 15 g Radford Pax, NP   cephALEXin (KEFLEX) 500 MG capsule Take 1 capsule (500 mg total) by mouth 3 (three) times daily for 7 days. 21 capsule Radford Pax, NP   mupirocin cream (BACTROBAN) 2 % Apply 1 Application topically 2 (two) times daily. 15 g Radford Pax, NP      PDMP not reviewed this encounter.   Radford Pax, NP 03/30/23 1605    Radford Pax, NP 03/30/23 1606

## 2023-04-26 ENCOUNTER — Ambulatory Visit
Admission: EM | Admit: 2023-04-26 | Discharge: 2023-04-26 | Disposition: A | Discharge status: Home / Self Care | Payer: Medicare Other | Attending: Family Medicine | Admitting: Family Medicine

## 2023-04-26 DIAGNOSIS — B029 Zoster without complications: Secondary | ICD-10-CM

## 2023-04-26 DIAGNOSIS — R21 Rash and other nonspecific skin eruption: Secondary | ICD-10-CM

## 2023-04-26 NOTE — ED Triage Notes (Signed)
Pt c/o cyst under nose x3 wks. Was seen on 6/3 for the same issue & was given Bactroban & keflex which she finished. Still c/o itchiness & discomfort.

## 2023-04-26 NOTE — Discharge Instructions (Signed)
I am concerned you have shingles on your face. Follow up with your primary care provider.   Use facial tissues with lotion to wipe your nose. Apply Vaseline under the nose to help re-hydrate the skin. Pick up some hydrocortisone cream and apply to the areas that burn.

## 2023-04-26 NOTE — ED Provider Notes (Signed)
[facility]    CSN: [id number] Arrival date & time: 04/26/23  1443      History   Chief Complaint Chief Complaint  Patient presents with   Cyst    HPI [name] is a 89 y.o. female.   HPI  [name] presents for rash under her nose. She has some pain in the area.     ***Redness, pain, swelling ***Treatments tried ***Previous symptoms ***Insect or bug bites  Fever : no Vision changes: No Sore throat: no   Shortness of breath: no Rhinorrhea: no Appetite: normal  Hydration: normal  Abdominal pain: no Nausea: no Vomiting: no Diarrhea: no Dysuria: no  Sleep disturbance: no Arthralgias: no Headache: no   Past Medical History:  Diagnosis Date   Cervical disc disease    Chronic kidney disease    stage 3   COPD (chronic obstructive pulmonary disease) (HCC)    Cystitis    Degenerative joint disease    Diabetes mellitus without complication (HCC)    DM type 2   Diabetic retinopathy associated with type 2 diabetes mellitus, without macular edema    Diverticulosis of colon    without mention of hemorrhage   Gastric ulcer    GERD (gastroesophageal reflux disease)    Hyperlipemia    Hypertension    Mitral valve prolapse    Premature atrial contraction    Sick sinus syndrome (HCC)    Sinoatrial node dysfunction (HCC)     Patient Active Problem List   Diagnosis Date Noted   Acute metabolic encephalopathy 02/17/2020   UTI (urinary tract infection) 02/17/2020   HTN (hypertension) 02/17/2020   HLD (hyperlipidemia) 02/17/2020   Type II diabetes mellitus with renal manifestations (HCC) 02/17/2020   GERD (gastroesophageal reflux disease) 02/17/2020   Lactic acidosis 02/17/2020   History of peptic ulcer disease 09/16/2017   Gait instability 09/16/2017   Obesity (BMI 30-39.9) 03/10/2017   Trochanteric bursitis of left hip 03/10/2017   Acute renal failure superimposed on stage 3a chronic kidney disease (HCC) 12/31/2016   Moderate tricuspid  insufficiency 12/31/2016   B12 deficiency 05/20/2016   Moderate mitral insufficiency 12/25/2015   Bilateral carotid artery stenosis 11/27/2015   Chronic venous insufficiency 10/09/2015   LVH (left ventricular hypertrophy) due to hypertensive disease, with heart failure (HCC) 10/09/2015   Controlled type 2 diabetes mellitus with stage 3 chronic kidney disease (HCC) 04/06/2015   Edema leg 04/05/2015   Hypertensive left ventricular hypertrophy 04/05/2015   DDD (degenerative disc disease), lumbar 12/13/2014   Neuritis or radiculitis due to rupture of lumbar intervertebral disc 12/13/2014   Hyperlipidemia due to type 2 diabetes mellitus (HCC) 08/15/2014   COPD, mild (HCC) 08/14/2014   Incomplete bladder emptying 07/05/2014   Neuralgia neuritis, sciatic nerve 07/05/2014   Urge incontinence 07/05/2014   FOM (frequency of micturition) 07/05/2014   Benign essential HTN 06/14/2014   Lumbar canal stenosis 06/14/2014   SA node dysfunction (HCC) 06/14/2014   Hypertension associated with diabetes (HCC) 06/14/2014    Past Surgical History:  Procedure Laterality Date   ABDOMINAL HYSTERECTOMY     BACK SURGERY     lumbar back surgery   COLONOSCOPY     ESOPHAGOGASTRODUODENOSCOPY     gastric ulcer surgery     KIDNEY SURGERY     pacemaker     insertion dual chamber pacemaker generator   PARATHYROIDECTOMY     THYROIDECTOMY, PARTIAL     lobectomy partial thyroid    OB History   No obstetric history  on file.      Home Medications    Prior to Admission medications   Medication Sig Start Date End Date Taking? Authorizing Provider  acetaminophen (TYLENOL) 500 MG tablet Take by mouth.   Yes [provider]  aspirin EC 81 MG tablet Take 81 mg by mouth daily.   Yes [provider]  atorvastatin (LIPITOR) 40 MG tablet Take 40 mg by mouth daily.    Yes [provider]  budesonide-formoterol (SYMBICORT) 160-4.5 MCG/ACT inhaler Inhale 2 puffs into the lungs 2 (two) times  daily as needed.  07/17/17  Yes [provider]  clopidogrel (PLAVIX) 75 MG tablet Take 75 mg by mouth daily. 02/17/20  Yes [provider]  furosemide (LASIX) 20 MG tablet Take 20 mg by mouth daily. 10/29/19  Yes [provider]  gentamicin (GARAMYCIN) 0.3 % ophthalmic solution  12/01/19  Yes [provider]  glipiZIDE (GLUCOTROL) 5 MG tablet Take by mouth. 01/08/23 01/08/24 Yes [provider]  ipratropium (ATROVENT) 0.03 % nasal spray  11/16/19  Yes [provider]  Iron-Vitamin C (VITRON-C) 65-125 MG TABS Take by mouth. 10/23/17  Yes [provider]  isosorbide mononitrate (IMDUR) 60 MG 24 hr tablet Take 60 mg by mouth daily.    Yes [provider]  LEVEMIR FLEXPEN 100 UNIT/ML FlexPen Inject 20 Units into the skin at bedtime. 12/09/22  Yes [provider]  mupirocin cream (BACTROBAN) 2 % Apply 1 Application topically 2 (two) times daily. 03/30/23  Yes Radford Pax, NP  olmesartan (BENICAR) 20 MG tablet Take 20 mg by mouth daily. 02/17/20  Yes [provider]  vitamin B-12 (CYANOCOBALAMIN) 500 MCG tablet Take 1,000 mcg by mouth daily.    Yes [provider]    Family History Family History  Problem Relation Age of Onset   Breast cancer Mother 48   Breast cancer Other    Breast cancer Maternal Aunt        2 mat aunts    Social History Social History   Tobacco Use   Smoking status: Never   Smokeless tobacco: Never  Vaping Use   Vaping Use: Never used  Substance Use Topics   Alcohol use: No    Alcohol/week: 0.0 standard drinks of alcohol   Drug use: No     Allergies   Darvon [propoxyphene], Hydrocodone, Janumet [sitagliptin-metformin hcl], Motrin [ibuprofen], and Tramadol   Review of Systems Review of Systems :negative unless otherwise stated in HPI.      Physical Exam Triage Vital Signs ED Triage Vitals  Enc Vitals Group     BP 04/26/23 1506 (!) 177/90     Pulse Rate 04/26/23  1506 85     Resp 04/26/23 1506 16     Temp 04/26/23 1506 98.4 F (36.9 C)     Temp Source 04/26/23 1506 Oral     SpO2 04/26/23 1506 99 %     Weight 04/26/23 1505 170 lb (77.1 kg)     Height 04/26/23 1505 5\' 3"  (1.6 m)     Head Circumference --      Peak Flow --      Pain Score 04/26/23 1514 0     Pain Loc --      Pain Edu? --      Excl. in GC? --    No data found.  Updated Vital Signs BP (!) 177/90 (BP Location: Left Arm)   Pulse 85   Temp 98.4 F (36.9 C) (Oral)  Resp 16   Ht 5\' 3"  (1.6 m)   Wt 77.1 kg   SpO2 99%   BMI 30.11 kg/m   Visual Acuity Right Eye Distance:   Left Eye Distance:   Bilateral Distance:    Right Eye Near:   Left Eye Near:    Bilateral Near:     Physical Exam  GEN: alert, well appearing female, in no acute distress *** EYES: extra occular movements intact, no scleral injection*** CV: regular rate ***and rhythm RESP: no increased work of breathing, ***clear to ascultation bilaterally MSK: no extremity edema, no gross deformities NEURO: alert, moves all extremities appropriately, normal gait PSYCH: Normal affect, appropriate speech and behavior  SKIN: warm and dry; ***   ***pic  UC Treatments / Results  Labs (all labs ordered are listed, but only abnormal results are displayed) Labs Reviewed - No data to display  EKG   Radiology No results found.  Procedures Procedures (including critical care time)  Medications Ordered in UC Medications - No data to display  Initial Impression / Assessment and Plan / UC Course  I have reviewed the triage vital signs and the nursing notes.  Pertinent labs & imaging results that were available during my care of the patient were reviewed by me and considered in my medical decision making (see chart for details).     Patient is a 89 y.o. femalewho presents for***.  Overall, patient is well-appearing and well-hydrated.  Vital signs stable.  [name] is afebrile.  Exam concerning for ***.  Treat  with***steroid ointment. ***No sign of infection to suggest antibiotics at this time.     Reviewed expectations regarding course of current medical issues.  All questions asked were answered.  Outlined signs and symptoms indicating need for more acute intervention. Patient verbalized understanding. After Visit Summary given.   Final Clinical Impressions(s) / UC Diagnoses   Final diagnoses:  None   Discharge Instructions   None    ED Prescriptions   None    PDMP not reviewed this encounter.

## 2023-04-29 ENCOUNTER — Emergency Department: Payer: Medicare Other

## 2023-04-29 ENCOUNTER — Encounter: Payer: Self-pay | Admitting: Internal Medicine

## 2023-04-29 ENCOUNTER — Observation Stay
Admission: EM | Admit: 2023-04-29 | Discharge: 2023-05-01 | Disposition: A | Discharge status: Home / Self Care | Payer: Medicare Other | Attending: Internal Medicine | Admitting: Internal Medicine

## 2023-04-29 ENCOUNTER — Other Ambulatory Visit: Payer: Self-pay

## 2023-04-29 DIAGNOSIS — I129 Hypertensive chronic kidney disease with stage 1 through stage 4 chronic kidney disease, or unspecified chronic kidney disease: Secondary | ICD-10-CM | POA: Diagnosis not present

## 2023-04-29 DIAGNOSIS — Z79899 Other long term (current) drug therapy: Secondary | ICD-10-CM | POA: Insufficient documentation

## 2023-04-29 DIAGNOSIS — B0229 Other postherpetic nervous system involvement: Secondary | ICD-10-CM

## 2023-04-29 DIAGNOSIS — Z7984 Long term (current) use of oral hypoglycemic drugs: Secondary | ICD-10-CM | POA: Insufficient documentation

## 2023-04-29 DIAGNOSIS — Z7982 Long term (current) use of aspirin: Secondary | ICD-10-CM | POA: Insufficient documentation

## 2023-04-29 DIAGNOSIS — R4182 Altered mental status, unspecified: Secondary | ICD-10-CM | POA: Diagnosis present

## 2023-04-29 DIAGNOSIS — G459 Transient cerebral ischemic attack, unspecified: Principal | ICD-10-CM

## 2023-04-29 DIAGNOSIS — E1122 Type 2 diabetes mellitus with diabetic chronic kidney disease: Secondary | ICD-10-CM | POA: Diagnosis not present

## 2023-04-29 DIAGNOSIS — I10 Essential (primary) hypertension: Secondary | ICD-10-CM | POA: Diagnosis not present

## 2023-04-29 DIAGNOSIS — N184 Chronic kidney disease, stage 4 (severe): Secondary | ICD-10-CM | POA: Diagnosis present

## 2023-04-29 DIAGNOSIS — J449 Chronic obstructive pulmonary disease, unspecified: Secondary | ICD-10-CM | POA: Diagnosis not present

## 2023-04-29 DIAGNOSIS — R2990 Unspecified symptoms and signs involving the nervous system: Secondary | ICD-10-CM

## 2023-04-29 DIAGNOSIS — Z7902 Long term (current) use of antithrombotics/antiplatelets: Secondary | ICD-10-CM | POA: Insufficient documentation

## 2023-04-29 DIAGNOSIS — Z794 Long term (current) use of insulin: Secondary | ICD-10-CM | POA: Insufficient documentation

## 2023-04-29 DIAGNOSIS — E1129 Type 2 diabetes mellitus with other diabetic kidney complication: Secondary | ICD-10-CM | POA: Diagnosis present

## 2023-04-29 LAB — COMPREHENSIVE METABOLIC PANEL
ALT: 39 U/L (ref 0–44)
AST: 30 U/L (ref 15–41)
Albumin: 4.1 g/dL (ref 3.5–5.0)
Alkaline Phosphatase: 57 U/L (ref 38–126)
Anion gap: 9 (ref 5–15)
BUN: 32 mg/dL — ABNORMAL HIGH (ref 8–23)
CO2: 21 mmol/L — ABNORMAL LOW (ref 22–32)
Calcium: 8.8 mg/dL — ABNORMAL LOW (ref 8.9–10.3)
Chloride: 108 mmol/L (ref 98–111)
Creatinine, Ser: 1.74 mg/dL — ABNORMAL HIGH (ref 0.44–1.00)
GFR, Estimated: 28 mL/min — ABNORMAL LOW (ref >60)
Glucose, Bld: 127 mg/dL — ABNORMAL HIGH (ref 70–99)
Potassium: 4.2 mmol/L (ref 3.5–5.1)
Sodium: 138 mmol/L (ref 135–145)
Total Bilirubin: 1 mg/dL (ref 0.3–1.2)
Total Protein: 6.9 g/dL (ref 6.5–8.1)

## 2023-04-29 LAB — CBC WITH DIFFERENTIAL/PLATELET
Abs Immature Granulocytes: 0.01 10*3/uL (ref 0.00–0.07)
Basophils Absolute: 0 10*3/uL (ref 0.0–0.1)
Basophils Relative: 1 %
Eosinophils Absolute: 0.1 10*3/uL (ref 0.0–0.5)
Eosinophils Relative: 2 %
HCT: 44.1 % (ref 36.0–46.0)
Hemoglobin: 13.6 g/dL (ref 12.0–15.0)
Immature Granulocytes: 0 %
Lymphocytes Relative: 21 %
Lymphs Abs: 0.9 10*3/uL (ref 0.7–4.0)
MCH: 26.5 pg (ref 26.0–34.0)
MCHC: 30.8 g/dL (ref 30.0–36.0)
MCV: 86 fL (ref 80.0–100.0)
Monocytes Absolute: 0.4 10*3/uL (ref 0.1–1.0)
Monocytes Relative: 9 %
Neutro Abs: 3 10*3/uL (ref 1.7–7.7)
Neutrophils Relative %: 67 %
Platelets: 137 10*3/uL — ABNORMAL LOW (ref 150–400)
RBC: 5.13 MIL/uL — ABNORMAL HIGH (ref 3.87–5.11)
RDW: 14.6 % (ref 11.5–15.5)
WBC: 4.3 10*3/uL (ref 4.0–10.5)
nRBC: 0 % (ref 0.0–0.2)

## 2023-04-29 LAB — URINALYSIS, ROUTINE W REFLEX MICROSCOPIC
Bilirubin Urine: NEGATIVE
Glucose, UA: NEGATIVE mg/dL
Hgb urine dipstick: NEGATIVE
Ketones, ur: NEGATIVE mg/dL
Leukocytes,Ua: NEGATIVE
Nitrite: NEGATIVE
Protein, ur: NEGATIVE mg/dL
Specific Gravity, Urine: 1.014 (ref 1.005–1.030)
pH: 5 (ref 5.0–8.0)

## 2023-04-29 LAB — GLUCOSE, CAPILLARY
Glucose-Capillary: 170 mg/dL — ABNORMAL HIGH (ref 70–99)
Glucose-Capillary: 226 mg/dL — ABNORMAL HIGH (ref 70–99)

## 2023-04-29 LAB — CBG MONITORING, ED: Glucose-Capillary: 132 mg/dL — ABNORMAL HIGH (ref 70–99)

## 2023-04-29 LAB — LIPID PANEL
Cholesterol: 144 mg/dL (ref 0–200)
HDL: 59 mg/dL (ref >40)
LDL Cholesterol: 65 mg/dL (ref 0–99)
Total CHOL/HDL Ratio: 2.4 RATIO
Triglycerides: 102 mg/dL (ref <150)
VLDL: 20 mg/dL (ref 0–40)

## 2023-04-29 LAB — HEMOGLOBIN A1C
Hgb A1c MFr Bld: 7.8 % — ABNORMAL HIGH (ref 4.8–5.6)
Mean Plasma Glucose: 177.16 mg/dL

## 2023-04-29 LAB — SEDIMENTATION RATE: Sed Rate: 5 mm/hr (ref 0–30)

## 2023-04-29 LAB — TSH: TSH: 1.334 u[IU]/mL (ref 0.350–4.500)

## 2023-04-29 MED ORDER — ENOXAPARIN SODIUM 30 MG/0.3ML IJ SOSY: 30.0000 mg | PREFILLED_SYRINGE | INTRAMUSCULAR | Status: DC

## 2023-04-29 MED ORDER — CLOPIDOGREL BISULFATE 75 MG PO TABS
75.0000 mg | ORAL_TABLET | Freq: Every day | ORAL | Status: DC
  Administered 2023-04-29 – 2023-05-01 (×3): 75 mg via ORAL
  Filled 2023-04-29 (×3): qty 1

## 2023-04-29 MED ORDER — ATORVASTATIN CALCIUM 20 MG PO TABS
40.0000 mg | ORAL_TABLET | Freq: Every day | ORAL | Status: DC
  Administered 2023-04-29 – 2023-04-30 (×2): 40 mg via ORAL
  Filled 2023-04-29 (×2): qty 2

## 2023-04-29 MED ORDER — STROKE: EARLY STAGES OF RECOVERY BOOK: Freq: Once | Status: AC

## 2023-04-29 MED ORDER — INSULIN ASPART 100 UNIT/ML IJ SOLN
0.0000 [IU] | Freq: Three times a day (TID) | INTRAMUSCULAR | Status: DC
  Administered 2023-04-29 – 2023-05-01 (×3): 2 [IU] via SUBCUTANEOUS
  Filled 2023-04-29 (×3): qty 1

## 2023-04-29 MED ORDER — ASPIRIN 81 MG PO TBEC
81.0000 mg | DELAYED_RELEASE_TABLET | Freq: Every day | ORAL | Status: DC
  Administered 2023-04-30 – 2023-05-01 (×2): 81 mg via ORAL
  Filled 2023-04-29 (×2): qty 1

## 2023-04-29 MED ORDER — SENNOSIDES-DOCUSATE SODIUM 8.6-50 MG PO TABS: 1.0000 | ORAL_TABLET | Freq: Every evening | ORAL | Status: DC | PRN

## 2023-04-29 MED ORDER — ASPIRIN 325 MG PO TABS
325.0000 mg | ORAL_TABLET | Freq: Every day | ORAL | Status: AC
  Administered 2023-04-29: 325 mg via ORAL
  Filled 2023-04-29: qty 1

## 2023-04-29 MED ORDER — GABAPENTIN 100 MG PO CAPS
100.0000 mg | ORAL_CAPSULE | Freq: Every day | ORAL | Status: DC
  Administered 2023-04-29 – 2023-04-30 (×2): 100 mg via ORAL
  Filled 2023-04-29 (×2): qty 1

## 2023-04-29 MED ORDER — ACETAMINOPHEN 325 MG PO TABS: 650.0000 mg | ORAL_TABLET | ORAL | Status: DC | PRN

## 2023-04-29 MED ORDER — ACETAMINOPHEN 160 MG/5ML PO SOLN: 650.0000 mg | ORAL | Status: DC | PRN

## 2023-04-29 MED ORDER — ACETAMINOPHEN 650 MG RE SUPP: 650.0000 mg | RECTAL | Status: DC | PRN

## 2023-04-29 MED ORDER — INSULIN GLARGINE-YFGN 100 UNIT/ML ~~LOC~~ SOLN
5.0000 [IU] | Freq: Every day | SUBCUTANEOUS | Status: DC
  Administered 2023-04-29: 5 [IU] via SUBCUTANEOUS
  Filled 2023-04-29 (×2): qty 0.05

## 2023-04-29 MED ORDER — ASPIRIN 300 MG RE SUPP: 300.0000 mg | Freq: Every day | RECTAL | Status: DC

## 2023-04-29 NOTE — ED Triage Notes (Addendum)
Pt's daughter reports LKW 8pm yesterday; daughter went out to porch around 11am today and tried talking with pt who would not verbally respond at that time; states noticed R eye droop at that time; reports both pt's speech and R eye are back to normal; daughter states a similar episode happened before in which pt did not drink enough water. Pt A&Ox3 (doesn't know year). Pt received 13 units of insulin last night per daughter. Pt's speech clear currently; daughter confirms.

## 2023-04-29 NOTE — Assessment & Plan Note (Addendum)
Patient is presenting with right-sided facial droop and right arm weakness that has now mostly resolved.  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  Unable to obtain MRI due to pacemaker.  Will obtain carotid ultrasounds instead of CTA given CKD stage IV.  Patient has a history of gastric ulcer noted, however last endoscopy was in 2008.  Low risk for bleeding at this time.  - Neurology consulted; appreciate their recommendations - Carotid ultrasound ordered - Repeat CT head in 24 hours - Telemetry monitoring - Allow for permissive HTN (systolic < 220 and diastolic < 120) - Echocardiogram  - A1C and Lipid panel - Additional labs per neurology to include TSH, ESR and RPR - Aspirin 325 mg once followed by aspirin 81 mg daily - Continue home Plavix 75 mg daily - Continue home high intensity statin  - PT/OT/SLP

## 2023-04-29 NOTE — Assessment & Plan Note (Signed)
Per chart review, patient's creatinine ranges between 1.7 and 1.8 for the last 2 years with GFR between 27-32.  Currently 28 today consistent with baseline.  - Avoid nephrotoxic agents - Will continue to monitor while admitted

## 2023-04-29 NOTE — ED Notes (Signed)
EDP Siadecki in triage 2 now to eval pt.

## 2023-04-29 NOTE — Consult Note (Signed)
Neurology Consultation Reason for Consult: Slurred speech, weakness Requesting Physician: Trinna Post  CC: Fluctuating neurological symptoms  History is obtained from: Daughter, limited from patient's secondary to baseline dementia, chart review  HPI: [name] is a 89 y.o. female with a past medical history significant for sick sinus syndrome with pacemaker in place, CKD stage III, hypertension, hyperlipidemia, diabetes, prior stroke (04/2020 per chart summary, no details available, "small stroke" per daughter), gastric ulcer, cervical disc disease and lumbar spine stenosis, BMI 30.29, urge incontinence  Per daughter the patient was last seen at her normal state of health at around 11 AM, when she had gone out to check whether the patient wanted anything to eat.  Shortly afterwards (variable report initially stated may be around noon but then reported she would not have left her mom out there for an hour), she went out to check if the patient wanted any water.  Patient was generally weak and slow to respond.  Blood sugar was checked and normal per home glucometer at 133.  She handed the patient water into her right hand but she cannot hold onto it and spilled it on herself.  She was also not opening her right eye very well.  Subsequently she was transported by EMS to the ED.  In the ED the patient's daughter did see the patient again and at that time felt like her slurred speech and weakness had resolved however felt that the patient had now a LEFT facial droop, at which time code stroke was activated  Of note she recently had a facial infection initially treated as staph infection.  Subsequently she complained of some burning pain in that ear area on Sunday and was started on gabapentin.  She took the first dose of gabapentin 100 mg at night but then took a dose yesterday as well as this morning and maybe has been a little sleepy secondary to the medication.   Although aspirin and Plavix are  both listed on patient's medication list, daughter is not sure that she is on these medications and is not sure why  LKW: 11 AM Thrombolytic given?: No, too mild to treat, patient essentially at baseline after CT scan  Checklist of contraindications was reviewed and otherwise negative IA performed?: No, examination not consistent with LVO Premorbid modified rankin scale:      4 - Moderately severe disability. Unable to attend to own bodily needs without assistance, and unable to walk unassisted.  Wears depends, ambulates with a walker; not atypical for patient to incorrectly reported her age and the month, or have some slight word finding difficulties   ROS: All other review of systems was negative except as noted in the HPI, with caveat of being obtained from family member due to patient's baseline cognitive impairment  Past Medical History:  Diagnosis Date   Cervical disc disease    Chronic kidney disease    stage 3   COPD (chronic obstructive pulmonary disease) (HCC)    Cystitis    Degenerative joint disease    Diabetes mellitus without complication (HCC)    DM type 2   Diabetic retinopathy associated with type 2 diabetes mellitus, without macular edema    Diverticulosis of colon    without mention of hemorrhage   Gastric ulcer    GERD (gastroesophageal reflux disease)    Hyperlipemia    Hypertension    Mitral valve prolapse    Premature atrial contraction    Sick sinus syndrome (HCC)  Sinoatrial node dysfunction (HCC)    Past Surgical History:  Procedure Laterality Date   ABDOMINAL HYSTERECTOMY     BACK SURGERY     lumbar back surgery   COLONOSCOPY     ESOPHAGOGASTRODUODENOSCOPY     gastric ulcer surgery     KIDNEY SURGERY     pacemaker     insertion dual chamber pacemaker generator   PARATHYROIDECTOMY     THYROIDECTOMY, PARTIAL     lobectomy partial thyroid     Family History  Problem Relation Age of Onset   Breast cancer Mother 53   Breast cancer Other     Breast cancer Maternal Aunt        2 mat aunts    Social History:  reports that she has never smoked. She has never used smokeless tobacco. She reports that she does not drink alcohol and does not use drugs.   Exam: Current vital signs: BP (!) 188/91   Pulse 62   Temp 98.5 F (36.9 C)   Resp 15   Ht 5\' 3"  (1.6 m)   Wt 77.6 kg   SpO2 96%   BMI 30.29 kg/m  Vital signs in last 24 hours: Temp:  [98.5 F (36.9 C)] 98.5 F (36.9 C) (07/03 1344) Pulse Rate:  [62] 62 (07/03 1344) Resp:  [15-17] 15 (07/03 1500) BP: (168-188)/(80-91) 188/91 (07/03 1500) SpO2:  [96 %] 96 % (07/03 1344) Weight:  [77.6 kg] 77.6 kg (07/03 1349)   Physical Exam  Constitutional: Appears well-developed and well-nourished.  Psych: Affect appropriate to situation, pleasant and cooperative Eyes: No scleral injection HENT: No oropharyngeal obstruction.  MSK: no joint deformities.  Cardiovascular: Perfusing extremities well Respiratory: Effort normal, non-labored breathing GI: Soft.  No distension. There is no tenderness.  Skin: Warm dry and intact visible skin  Neuro: Mental Status: Patient is awake, alert, oriented to person, can name daughter at bedside but reports her age is 74, cannot report month, cannot report any details of her presentation Mild difficulty with naming/repetition, baseline per family Cranial Nerves: II: Visual Fields are full.  III,IV, VI: EOMI without ptosis or diploplia.  V: Facial sensation is symmetric to light touch  VII: Facial movement is symmetric; possible very subtle asymmetry on the left side but family reports it is baseline post CT scan.  VIII: hearing is intact to voice X: Uvula elevates symmetrically XI: Shoulder shrug is symmetric. XII: tongue is midline without atrophy or fasciculations.  Motor: No drift in the bilateral upper extremities.  No drift in the bilateral lower extremities Sensory: Equally reactive to touch in all 4 extremities, left/right  confusion limits ability to reliably test sensation Cerebellar: FNF and HKS are intact bilaterally Gait:  Ambulates with a walker at her baseline, steady  NIHSS total 3 Score breakdown: 2 points for incorrectly stating month and age, 1 point for mild aphasia, all baseline per family    I have reviewed labs in epic and the results pertinent to this consultation are:  Basic Metabolic Panel: Recent Labs  Lab 04/29/23 1443  NA 138  K 4.2  CL 108  CO2 21*  GLUCOSE 127*  BUN 32*  CREATININE 1.74*  CALCIUM 8.8*    CBC: Recent Labs  Lab 04/29/23 1443  WBC 4.3  NEUTROABS 3.0  HGB 13.6  HCT 44.1  MCV 86.0  PLT 137*    Coagulation Studies: No results for input(s): "LABPROT", "INR" in the last 72 hours.    I have reviewed the images obtained:  Head CT personally reviewed, agree with radiology:   1. No hemorrhage or CT evidence of an acute cortical infarct. ASPECTS 10 when accounting for chronic findings. 2. Compared to 2020, there is a new, but chronic left parietal lobe infarct.   Impression: Unclear etiology of the patient's fluctuating and neurological symptoms.  Given patient's prior history of stroke she should certainly be at least on baby aspirin for secondary prevention.  However she has a history of gastric ulcer and family is not very certain of patient's medication history at this time.  Appreciate ED/primary team's efforts in clarifying whether or not the patient is on aspirin, daughter has been instructed to try to clarify what medications the patient has at home  Differential includes:  -Possible stroke/TIA given elevated blood pressure  -Possible dementia related fluctuation  -Possible medication side effect related to recent initiation of gabapentin  Recommendations: # Possible stroke/TIA - Stroke labs TSH, ESR, RPR, HgbA1c, fasting lipid panel - MRI brain cannot be completed due to pacemaker, repeat head CT at 24 hours will be obtained - CTA  contraindicated due to renal function, will obtain carotid duplex - Frequent neuro checks, every 2 hours for 12 hours followed by every 4 hours - Echocardiogram - Prophylactic therapy-Antiplatelet med: Aspirin - dose 325mg  PO or 300mg  PR, followed by 81 mg daily if no medical contraindications determined - If no medical contraindications determined please give Plavix 300 mg load with 75 mg daily for 21 - 90 day course  - Risk factor modification - Telemetry monitoring - Blood pressure goal   - Permissive hypertension to 220/120 due to possibility of acute stroke - PT consult, OT consult, Speech consult, unless patient remains at baseline - Neurology to follow - Re-time gabapentin 100 mg to nightly   Brooke Dare MD-PhD [facility] [phone] Available 7 AM to 7 PM, outside these hours please contact Neurologist on call listed on AMION    Total critical care time: 40 minutes   Critical care time was exclusive of separately billable procedures and treating other patients.   Critical care was necessary to treat or prevent imminent or life-threatening deterioration --emergent evaluation for consideration of thrombectomy or thrombolytic   Critical care was time spent personally by me on the following activities: development of treatment plan with patient and/or surrogate as well as nursing, discussions with consultants/primary team, evaluation of patient's response to treatment, examination of patient, obtaining history from patient or surrogate, ordering and performing treatments and interventions, ordering and review of laboratory studies, ordering and review of radiographic studies, and re-evaluation of patient's condition as needed, as documented above.

## 2023-04-29 NOTE — ED Notes (Signed)
Neuro cart activated and neuro provider in CT room evaluating pt.

## 2023-04-29 NOTE — ED Notes (Addendum)
NT Faith and Caitlyn attempted for blue, lt grn, red, lav tubes without success. 20g EMS IV at R fa will currently not pull enough blood back.

## 2023-04-29 NOTE — Assessment & Plan Note (Signed)
-  Continue home bronchodilators 

## 2023-04-29 NOTE — ED Notes (Signed)
Notified First Nurse Erskine Squibb that pt may end up being a Code Stroke. Will find an EDP to quickly eval pt.

## 2023-04-29 NOTE — ED Notes (Signed)
Relayed pt info to EDP Siadecki in person.

## 2023-04-29 NOTE — ED Notes (Addendum)
Slight L sided facial weakness at pt's lip confirmed by daughter. Pt able to hold both arms up for 10 seconds each; no drift currently noted. Speech currently clear per family.

## 2023-04-29 NOTE — Assessment & Plan Note (Signed)
Recently started on gabapentin with improvement in symptoms  - Gabapentin 100 mg prior to bedtime

## 2023-04-29 NOTE — Code Documentation (Signed)
Stroke Response Nurse Documentation Code Documentation  Deandra Brull is a 89 y.o. female arriving to [facility] via [facility] on 04/29/2023 with past medical hx of DM, HLD, HTN, COPD, CKD, GERD, mitral valva prolapse, premature atrial contracture, sick sinus syndrome, SA node dysfunction, pacemaker. On aspirin 81 mg daily. Pt's daughter at bedside reports she gives pt aspirin when she asks. Code stroke was activated by ED.   Patient from home where she was LKW 1100 and now complaining of left facial droop and confusion starting shortly after 1100.  Daughter reports patient was at baseline at 1100. Shortly after 1100, daughter checked on patient and noticed that she was slow to respond, unable to hold a cup of water,  had right eye droop, and slurred speech. Concern for left facial droop on arrival to ED.  Stroke team at the bedside on patient arrival. Labs drawn and patient cleared for CT by Dr. Marisa Severin. Patient to CT with team. Delay in labs, had to be recollected. NIHSS 3, see documentation for details and code stroke times. Patient with disoriented and Expressive aphasia  on exam. The following imaging was completed:  CT Head. Patient is not a candidate for IV Thrombolytic due to too mild to treat, per MD. Patient is not a candidate for IR due exam not consistent with LVO, per MD.   Care Plan: q30 min vital signs and NIHSS until patient outside window at 1530 followed by every 2 hour vital signs and NIHSS. Swallow screen per order.   Bedside handoff with ED RN Jarold Motto  Stroke Response RN

## 2023-04-29 NOTE — H&P (Addendum)
History and Physical    Patient: [name] NWG:[id number] DOB: 11/11/1930 DOA: 04/29/2023 DOS: the patient was seen and examined on 04/29/2023 PCP: [name], MD  Patient coming from: Home  Chief Complaint:  Chief Complaint  Patient presents with   Altered Mental Status   HPI: [name] is a 89 y.o. female with medical history significant of hypertension, hyperlipidemia, type 2 diabetes, SA node dysfunction s/p PPM, COPD, CKD stage IIIb, who presents to the ED due to facial droop.  History obtained from patient's daughter at bedside.  She states that [name] was sitting outside in her chair.  Her daughter went to go check on her and noted that she was not responding very quick and seemed lethargic with slurred speech.  When she looked at her, she noted right-sided facial droop especially in the eye in addition to weakness of the right arm.  She tried to hand her drink and patient was unable to grab the drink with her right arm.  After arriving to the ED, her daughter stated that it seemed like her left side of her face was more drooping now and the right side droop had resolved.  [name] denies any recent illness, nausea, vomiting, diarrhea, chest pain, shortness of breath or lower extremity swelling.  She has not been taking her aspirin.  She was recently started on gabapentin for suspected post herpetic neuralgia and this has been helping.  ED course: On arrival to the ED, patient was hypertensive at 168/80 with heart rate of 62.  She was saturating at 96% on room air.  She was afebrile at 98.5.  Initial workup demonstrated platelets of 137, bicarb 21, glucose 127, BUN 32, creatinine 1.74 with GFR of 28.  CT head was obtained as part of code stroke that demonstrated chronic-appearing left parietal lobe infarct that is new compared to 2020 though.  Neurology recommending admission for TIA workup.  Review of Systems: As mentioned in the history of present illness.  All other systems reviewed and are negative.  Past Medical History:  Diagnosis Date   Cervical disc disease    Chronic kidney disease    stage 3   COPD (chronic obstructive pulmonary disease) (HCC)    Cystitis    Degenerative joint disease    Diabetes mellitus without complication (HCC)    DM type 2   Diabetic retinopathy associated with type 2 diabetes mellitus, without macular edema    Diverticulosis of colon    without mention of hemorrhage   Gastric ulcer    GERD (gastroesophageal reflux disease)    Hyperlipemia    Hypertension    Mitral valve prolapse    Premature atrial contraction    Sick sinus syndrome (HCC)    Sinoatrial node dysfunction (HCC)    Past Surgical History:  Procedure Laterality Date   ABDOMINAL HYSTERECTOMY     BACK SURGERY     lumbar back surgery   COLONOSCOPY     ESOPHAGOGASTRODUODENOSCOPY     gastric ulcer surgery     KIDNEY SURGERY     pacemaker     insertion dual chamber pacemaker generator   PARATHYROIDECTOMY     THYROIDECTOMY, PARTIAL     lobectomy partial thyroid   Social History:  reports that she has never smoked. She has never used smokeless tobacco. She reports that she does not drink alcohol and does not use drugs.  Allergies  Allergen Reactions   Darvon [Propoxyphene] Nausea And Vomiting   Hydrocodone  Nausea And Vomiting   Janumet [Sitagliptin-Metformin Hcl] Nausea And Vomiting   Motrin [Ibuprofen] Nausea And Vomiting   Tramadol Nausea And Vomiting    Can tolerate this medication in small doses (takes half a pill).    Family History  Problem Relation Age of Onset   Breast cancer Mother 25   Breast cancer Other    Breast cancer Maternal Aunt        2 mat aunts    Prior to Admission medications   Medication Sig Start Date End Date Taking? Authorizing Provider  acetaminophen (TYLENOL) 500 MG tablet Take by mouth.    [provider]  aspirin EC 81 MG tablet Take 81 mg by mouth daily.    [provider]   atorvastatin (LIPITOR) 40 MG tablet Take 40 mg by mouth daily.     [provider]  budesonide-formoterol (SYMBICORT) 160-4.5 MCG/ACT inhaler Inhale 2 puffs into the lungs 2 (two) times daily as needed.  07/17/17   [provider]  clopidogrel (PLAVIX) 75 MG tablet Take 75 mg by mouth daily. 02/17/20   [provider]  furosemide (LASIX) 20 MG tablet Take 20 mg by mouth daily. 10/29/19   [provider]  gentamicin (GARAMYCIN) 0.3 % ophthalmic solution  12/01/19   [provider]  glipiZIDE (GLUCOTROL) 5 MG tablet Take by mouth. 01/08/23 01/08/24  [provider]  ipratropium (ATROVENT) 0.03 % nasal spray  11/16/19   [provider]  Iron-Vitamin C (VITRON-C) 65-125 MG TABS Take by mouth. 10/23/17   [provider]  isosorbide mononitrate (IMDUR) 60 MG 24 hr tablet Take 60 mg by mouth daily.     [provider]  LEVEMIR FLEXPEN 100 UNIT/ML FlexPen Inject 20 Units into the skin at bedtime. 12/09/22   [provider]  mupirocin cream (BACTROBAN) 2 % Apply 1 Application topically 2 (two) times daily. 03/30/23   Radford Pax, NP  olmesartan (BENICAR) 20 MG tablet Take 20 mg by mouth daily. 02/17/20   [provider]  vitamin B-12 (CYANOCOBALAMIN) 500 MCG tablet Take 1,000 mcg by mouth daily.     [provider]    Physical Exam: Vitals:   04/29/23 1349 04/29/23 1500 04/29/23 1600 04/29/23 1630  BP:  (!) 210/80 (!) 183/88 (!) 216/85  Pulse:    62  Resp:  15 13 17   Temp:    98.4 F (36.9 C)  SpO2:    98%  Weight: 77.6 kg     Height:       Physical Exam Vitals and nursing note reviewed.  Constitutional:      General: She is not in acute distress. HENT:     Head: Normocephalic and atraumatic.     Mouth/Throat:     Mouth: Mucous membranes are moist.     Pharynx: Oropharynx is clear.  Eyes:     Conjunctiva/sclera: Conjunctivae normal.     Pupils: Pupils are equal, round, and reactive to  light.  Cardiovascular:     Rate and Rhythm: Normal rate and regular rhythm.     Heart sounds: No murmur heard. Pulmonary:     Effort: Pulmonary effort is normal. No respiratory distress.     Breath sounds: Normal breath sounds. No wheezing, rhonchi or rales.  Abdominal:     General: Bowel sounds are normal. There is no distension.     Palpations: Abdomen is soft.     Tenderness: There is no abdominal tenderness.  Musculoskeletal:  Right lower leg: No edema.     Left lower leg: No edema.  Skin:    General: Skin is warm and dry.  Neurological:     Mental Status: She is alert.     Comments:  Patient is alert and oriented Mild right eye ptosis noted but no other facial droop.  No dysarthria 5 out of 5 strength throughout Sensation intact throughout Finger-to-nose normal bilaterally  Psychiatric:        Mood and Affect: Affect is flat.        Behavior: Behavior is cooperative.    Data Reviewed: CBC with WBC of 4.3, hemoglobin 13.6, MCV of 86, platelets of 137 CMP with sodium of 138, potassium 4.2, bicarb 21, glucose 127, BUN 32, creatinine 1.74, GFR 28.  AST 30, ALT 39 Urinalysis with no abnormalities, including glucosuria, hematuria, leukocytes or nitrites  EKG pending  CT HEAD CODE STROKE WO CONTRAST`  Result Date: 04/29/2023 CLINICAL DATA:  Code stroke. Transient ischemic attack (TIA) no lateralizing sinus provided EXAM: CT HEAD WITHOUT CONTRAST TECHNIQUE: Contiguous axial images were obtained from the base of the skull through the vertex without intravenous contrast. RADIATION DOSE REDUCTION: This exam was performed according to the departmental dose-optimization program which includes automated exposure control, adjustment of the mA and/or kV according to patient size and/or use of iterative reconstruction technique. COMPARISON:  CT Head 05/03/19 FINDINGS: Brain: Compared to 2020, there is a new, but chronic left parietal lobe infarct there is ex vacuo dilatation of the left  lateral ventricle in this region no hemorrhage. No extra-axial fluid collection Vascular: No hyperdense vessel or unexpected calcification. Skull: Normal. Negative for fracture or focal lesion. Sinuses/Orbits: No middle ear or mastoid effusion. Paranasal sinuses clear. Bilateral lens replacement. Orbits are otherwise unremarkable Other: None. ASPECTS [facility] Stroke Program Early CT Score): 10 when accounting for chronic findings. IMPRESSION: 1. No hemorrhage or CT evidence of an acute cortical infarct. ASPECTS 10 when accounting for chronic findings. 2. Compared to 2020, there is a new, but chronic left parietal lobe infarct. Findings were paged to Dr.Bhagat on 04/29/23 at 2:18 PM. Electronically Signed   By: Lorenza Cambridge M.D.   On: 04/29/2023 14:19    Results are pending, will review when available.  Assessment and Plan:  * TIA (transient ischemic attack) Patient is presenting with right-sided facial droop and right arm weakness that has now mostly resolved.  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  Unable to obtain MRI due to pacemaker.  Will obtain carotid ultrasounds instead of CTA given CKD stage IV.  Patient has a history of gastric ulcer noted, however last endoscopy was in 2008.  Low risk for bleeding at this time.  - Neurology consulted; appreciate their recommendations - Carotid ultrasound ordered - Repeat CT head in 24 hours - Telemetry monitoring - Allow for permissive HTN (systolic < 220 and diastolic < 120) - Echocardiogram  - A1C and Lipid panel - Additional labs per neurology to include TSH, ESR and RPR - Aspirin 325 mg once followed by aspirin 81 mg daily - Continue home Plavix 75 mg daily - Continue home high intensity statin  - PT/OT/SLP  Benign essential HTN Severe hypertension noted today with symptoms of TIA that are now resolved.  Otherwise, no evidence of hypertensive emergency.  - Gradually resume home  antihypertensives in the next 1-2 day  Type II diabetes mellitus with renal manifestations (HCC) Most recent A1c  of 7.2% approximately 3 months ago.  - Hold home antiglycemic agents - SSI, sensitive - A1c pending  CKD (chronic kidney disease) stage 4, GFR 15-29 ml/min (HCC) Per chart review, patient's creatinine ranges between 1.7 and 1.8 for the last 2 years with GFR between 27-32.  Currently 28 today consistent with baseline.  - Avoid nephrotoxic agents - Will continue to monitor while admitted  COPD, mild (HCC) - Continue home bronchodilators  Postherpetic neuralgia Recently started on gabapentin with improvement in symptoms  - Gabapentin 100 mg prior to bedtime  Advance Care Planning:   Code Status: Full Code   Consults: Neurology  Family Communication: Patient's daughter updated at bedside  Severity of Illness: The appropriate patient status for this patient is OBSERVATION. Observation status is judged to be reasonable and necessary in order to provide the required intensity of service to ensure the patient's safety. The patient's presenting symptoms, physical exam findings, and initial radiographic and laboratory data in the context of their medical condition is felt to place them at decreased risk for further clinical deterioration. Furthermore, it is anticipated that the patient will be medically stable for discharge from the hospital within 2 midnights of admission.   Author: Verdene Lennert, MD 04/29/2023 5:03 PM  For on call review [email or web address].

## 2023-04-29 NOTE — ED Notes (Signed)
Attempted for 18g Iv at L ac. Attempted to pull blood from EMS IV again. Flushes well and pulls back some blood but not enough for tubes. Stroke Coordinator RN aware.

## 2023-04-29 NOTE — ED Notes (Addendum)
Attempted to find an ER doc at Main ER desk to eval pt.

## 2023-04-29 NOTE — Progress Notes (Addendum)
Code stroke cart activated at 1410. Pt in CT at this time.  Dr. Iver Nestle paged at 1411. Dr. Iver Nestle in CT with pt at 1413.    Ricci Barker, Tele Stroke RN

## 2023-04-29 NOTE — Progress Notes (Signed)
   04/29/23 1600  Spiritual Encounters  Type of Visit Initial  Care provided to: Pt and family  Conversation partners present during encounter Nurse;Physician  Referral source Code page  Reason for visit Urgent spiritual support  OnCall Visit Yes  Spiritual Framework  Presenting Themes Courage hope and growth;Impactful experiences and emotions  Community/Connection Family  Patient Stress Factors Health changes  Family Stress Factors None identified  Interventions  Spiritual Care Interventions Made Reflective listening;Compassionate presence;Encouragement  Intervention Outcomes  Outcomes Awareness around self/spiritual resourses;Reduced anxiety  Spiritual Care Plan  Spiritual Care Issues Still Outstanding No further spiritual care needs at this time (see row info)   Chaplain received a code stroke and went to see the pt and daughter. Chaplain spoke with daughter and her anxiety about her mom. Chaplain sat with pt and daughter while doctor explained diagnosis. Daughter thanked the chaplain for staying with them.

## 2023-04-29 NOTE — ED Triage Notes (Addendum)
Pt comes via EMS from home. Per daughter pt was not acting right or responding well. Pt was hypotensive by Fire and O2 was all over the place. Pt had IV placed.  EMs placed pt on stretcher. Pt BP and O2 was normal. Pt denies any complaints.   Pt given 75 normal saline. 20 in right wrist BP-160/100 O2-97 % RA HR-90-110 Pt has pacemaker  CBG-127 but pt was given some D10 earlier by Fire.

## 2023-04-29 NOTE — Assessment & Plan Note (Addendum)
Severe hypertension noted today with symptoms of TIA that are now resolved.  Otherwise, no evidence of hypertensive emergency.  - Gradually resume home antihypertensives in the next 1-2 day

## 2023-04-29 NOTE — ED Provider Notes (Signed)
[facility] Provider Note    Event Date/Time   First MD Initiated Contact with Patient 04/29/23 1421     (approximate)   History   Altered Mental Status   HPI  [name] is a 89 y.o. female with history of hypertension, COPD, T2DM presenting to the emergency department for evaluation as a code stroke.  Timeline obtained with assistance of patient's daughter.  She reports that at 11 AM today, patient was at her baseline.  About 15 minutes later she went to check on the patient and noticed that the patient had a right eye droop with slurred speech and inability to hold water.  On arrival in triage here, daughter was concerned that she had a left-sided facial droop.  At the time of my initial evaluation after imaging, daughter does feel that patient is back at baseline.  Patient without acute complaints on my evaluation.    Physical Exam   Triage Vital Signs: ED Triage Vitals  Enc Vitals Group     BP 04/29/23 1344 (!) 168/80     Pulse Rate 04/29/23 1344 62     Resp 04/29/23 1344 17     Temp 04/29/23 1344 98.5 F (36.9 C)     Temp src --      SpO2 04/29/23 1344 96 %     Weight 04/29/23 1349 171 lb (77.6 kg)     Height 04/29/23 1348 5\' 3"  (1.6 m)     Head Circumference --      Peak Flow --      Pain Score 04/29/23 1347 0     Pain Loc --      Pain Edu? --      Excl. in GC? --     Most recent vital signs: Vitals:   04/29/23 1344 04/29/23 1500  BP: (!) 168/80 (!) 188/91  Pulse: 62   Resp: 17 15  Temp: 98.5 F (36.9 C)   SpO2: 96%      General: Awake, interactive  CV:  Regular rate, good peripheral perfusion.  Resp:  Lungs clear, unlabored respirations.  Abd:  Soft, nondistended.  Neuro:  Awake, interactive, answering basic questions appropriately, normal extraocular movements, no appreciable facial asymmetry, moving extremities spontaneously, speech at baseline per family.  Of note, patient's initial exam, not performed by myself  noted expressive aphasia and disorientation, NIHSS of 3.  ED Results / Procedures / Treatments   Labs (all labs ordered are listed, but only abnormal results are displayed) Labs Reviewed  CBC WITH DIFFERENTIAL/PLATELET - Abnormal; Notable for the following components:      Result Value   RBC 5.13 (*)    Platelets 137 (*)    All other components within normal limits  COMPREHENSIVE METABOLIC PANEL - Abnormal; Notable for the following components:   CO2 21 (*)    Glucose, Bld 127 (*)    BUN 32 (*)    Creatinine, Ser 1.74 (*)    Calcium 8.8 (*)    GFR, Estimated 28 (*)    All other components within normal limits  URINALYSIS, ROUTINE W REFLEX MICROSCOPIC - Abnormal; Notable for the following components:   Color, Urine YELLOW (*)    APPearance HAZY (*)    All other components within normal limits  CBG MONITORING, ED - Abnormal; Notable for the following components:   Glucose-Capillary 132 (*)    All other components within normal limits  CBG MONITORING, ED     EKG EKG independently reviewed  interpreted by myself (ER attending) demonstrates:  EKG demonstrates sinus rhythm at a rate of 75, QRS 132, QTc 471, no acute ST changes  RADIOLOGY Imaging independently reviewed and interpreted by myself demonstrates:  CT without evidence of hemorrhage or acute infarct, radiology notes old infarct  PROCEDURES:  Critical Care performed: No  Procedures   MEDICATIONS ORDERED IN ED: Medications - No data to display   IMPRESSION / MDM / ASSESSMENT AND PLAN / ED COURSE  I reviewed the triage vital signs and the nursing notes.  Differential diagnosis includes, but is not limited to, acute CVA, TIA, heat related illness, anemia, electrolyte abnormality, hypertensive pathology  Patient's presentation is most consistent with acute presentation with potential threat to life or bodily function.  89 year old female presenting with slurred speech and weakness as a code stroke.With a last  known well of shortly after 11 AM, patient presented with the thrombolytic window, but given overall minor deficits, neurology did not recommend TNK for this patient.  Case discussed with Dr. Iver Nestle, patient cannot get an MRI as she has a pacemaker, but she does recommend admission to medicine service for repeat CT and she will provide further recommendations.  Will reach out to hospitalist team.  Discussed with hospitalist team.  They will evaluate for anticipated admission.  FINAL CLINICAL IMPRESSION(S) / ED DIAGNOSES   Final diagnoses:  Stroke-like symptom     Rx / DC Orders   ED Discharge Orders     None        Note:  This document was prepared using Dragon voice recognition software and may include unintentional dictation errors.   Trinna Post, MD 04/29/23 [phone]

## 2023-04-29 NOTE — Assessment & Plan Note (Addendum)
Most recent A1c of 7.2% approximately 3 months ago.  - Hold home antiglycemic agents - SSI, sensitive - A1c pending

## 2023-04-30 ENCOUNTER — Observation Stay
Admit: 2023-04-30 | Discharge: 2023-04-30 | Disposition: A | Discharge status: Home / Self Care | Payer: Medicare Other | Attending: Internal Medicine | Admitting: Internal Medicine

## 2023-04-30 ENCOUNTER — Observation Stay: Payer: Medicare Other

## 2023-04-30 DIAGNOSIS — G459 Transient cerebral ischemic attack, unspecified: Secondary | ICD-10-CM

## 2023-04-30 LAB — GLUCOSE, CAPILLARY
Glucose-Capillary: 83 mg/dL (ref 70–99)
Glucose-Capillary: 91 mg/dL (ref 70–99)
Glucose-Capillary: 189 mg/dL — ABNORMAL HIGH (ref 70–99)
Glucose-Capillary: 237 mg/dL — ABNORMAL HIGH (ref 70–99)

## 2023-04-30 LAB — RPR: RPR Ser Ql: NONREACTIVE

## 2023-04-30 LAB — ECHOCARDIOGRAM COMPLETE

## 2023-04-30 MED ORDER — ISOSORBIDE MONONITRATE ER 30 MG PO TB24
60.0000 mg | ORAL_TABLET | Freq: Every day | ORAL | Status: DC
  Administered 2023-04-30 – 2023-05-01 (×2): 60 mg via ORAL
  Filled 2023-04-30 (×2): qty 2

## 2023-04-30 MED ORDER — INSULIN GLARGINE-YFGN 100 UNIT/ML ~~LOC~~ SOLN
12.0000 [IU] | Freq: Every day | SUBCUTANEOUS | Status: DC
  Administered 2023-04-30: 12 [IU] via SUBCUTANEOUS
  Filled 2023-04-30 (×2): qty 0.12

## 2023-04-30 MED ORDER — MOMETASONE FURO-FORMOTEROL FUM 200-5 MCG/ACT IN AERO
2.0000 | INHALATION_SPRAY | Freq: Two times a day (BID) | RESPIRATORY_TRACT | Status: DC
  Administered 2023-04-30 – 2023-05-01 (×3): 2 via RESPIRATORY_TRACT
  Filled 2023-04-30: qty 8.8

## 2023-04-30 MED ORDER — FUROSEMIDE 20 MG PO TABS
10.0000 mg | ORAL_TABLET | Freq: Every day | ORAL | Status: DC
  Administered 2023-04-30 – 2023-05-01 (×2): 10 mg via ORAL
  Filled 2023-04-30 (×2): qty 1

## 2023-04-30 MED ORDER — VITAMIN B-12 1000 MCG PO TABS
1000.0000 ug | ORAL_TABLET | Freq: Every day | ORAL | Status: DC
  Administered 2023-04-30 – 2023-05-01 (×2): 1000 ug via ORAL
  Filled 2023-04-30 (×2): qty 1

## 2023-04-30 MED ORDER — IRBESARTAN 150 MG PO TABS
150.0000 mg | ORAL_TABLET | Freq: Every day | ORAL | Status: DC
  Administered 2023-04-30 – 2023-05-01 (×2): 150 mg via ORAL
  Filled 2023-04-30 (×2): qty 1

## 2023-04-30 NOTE — Progress Notes (Addendum)
Progress Note    [name]  NFA:[id number] DOB: 1931/03/12  DOA: 04/29/2023 PCP: [name], MD      Brief Narrative:    Medical records reviewed and are as summarized below:  [name] is a 89 y.o. female  with medical history significant of hypertension, hyperlipidemia, insulin-dependent type 2 diabetes, SA node dysfunction s/p PPM, COPD, CKD stage IIIb, who presented to the emergency department because of altered mental status, facial droop and abnormal speech.  Patient was last seen normal on 04/29/2023 at 11 AM.  About an hour later, her daughter went back to check on her and she noted that patient was weak and slow to respond.  She handed her water but she could not hold a water with the right hand and spilled it on herself.  She could not open her right eye very well.  Her glucometer read 133 at that time.  EMS was called and she was brought to the ED for further evaluation.  In the ED, daughter stated that slurred speech and weakness had resolved but she felt the patient might have had a new left facial droop.       Assessment/Plan:   Principal Problem:   TIA (transient ischemic attack) Active Problems:   Benign essential HTN   Type II diabetes mellitus with renal manifestations (HCC)   CKD (chronic kidney disease) stage 4, GFR 15-29 ml/min (HCC)   COPD, mild (HCC)   Postherpetic neuralgia    Body mass index is 30.29 kg/m.   Probable TIA: Repeat CT head did not show any acute stroke.  Carotid duplex showed moderate to large amount of bilateral atherosclerotic plaque (right greater than left) but not resulting in hemodynamically significant stenosis within either internal carotid artery. 2D echo is pending. Neurologist recommended dual antiplatelet therapy with aspirin and Plavix for 21 days followed by Plavix monotherapy.  Continue Lipitor.   Insulin-dependent type 2 DM: Continue insulin glargine.  Increase dose from 5 units nightly to 12  units nightly which is her home dose.  NovoLog as needed for hyperglycemia.  Hemoglobin A1c is 7.8.   Hypertension: BP is uncontrolled.  Restart home isosorbide mononitrate and olmesartan.   Other comorbidities include CKD stage IV, COPD, postherpetic neuralgia   Diet Order             Diet heart healthy/carb modified Room service appropriate? Yes; Fluid consistency: Thin  Diet effective now                            Consultants: Neurologist  Procedures: None    Medications:    aspirin EC  81 mg Oral Daily   atorvastatin  40 mg Oral Daily   clopidogrel  75 mg Oral Daily   cyanocobalamin  1,000 mcg Oral Daily   furosemide  10 mg Oral Daily   gabapentin  100 mg Oral QHS   insulin aspart  0-9 Units Subcutaneous TID WC   insulin glargine-yfgn  12 Units Subcutaneous QHS   irbesartan  150 mg Oral Daily   isosorbide mononitrate  60 mg Oral Daily   mometasone-formoterol  2 puff Inhalation BID   Continuous Infusions:   Anti-infectives (From admission, onward)    None              Family Communication/Anticipated D/C date and plan/Code Status   DVT prophylaxis: Place and maintain sequential compression device Start: 04/29/23 1648  Code Status: Full Code  Family Communication: Plan discussed with her daughter at the bedside Disposition Plan: Plan to discharge home tomorrow   Status is: Observation The patient will require care spanning > 2 midnights and should be moved to inpatient because: Awaiting 2D echo       Subjective:   Interval events noted.  She has no complaints.  She feels better.  No unilateral numbness or weakness in the extremities.  Her daughter was at the bedside and she thinks that her facial droop has resolved.  Cristal Deer, RN was also at the bedside.  Objective:    Vitals:   04/30/23 0512 04/30/23 0717 04/30/23 1222 04/30/23 1544  BP: (!) 152/90 (!) 156/64 (!) 173/86 (!) 184/80  Pulse: 69 63 63 60  Resp:  16 18 18 16   Temp: 97.8 F (36.6 C) 97.6 F (36.4 C) 97.8 F (36.6 C) 98.1 F (36.7 C)  TempSrc: Oral Oral Oral   SpO2: 98% 99% 100% 99%  Weight:      Height:       No data found.   Intake/Output Summary (Last 24 hours) at 04/30/2023 1629 Last data filed at 04/29/2023 1845 Gross per 24 hour  Intake 120 ml  Output --  Net 120 ml   Filed Weights   04/29/23 1349  Weight: 77.6 kg    Exam:  GEN: NAD SKIN: No rash EYES: EOMI, PERRLA, no visual field cut ENT: MMM CV: RRR PULM: CTA B ABD: soft, ND, NT, +BS CNS: AAO x 2 (person and place), non focal, no facial droop, speech is slurred EXT: No edema or tenderness        Data Reviewed:   I have personally reviewed following labs and imaging studies:  Labs: Labs show the following:   Basic Metabolic Panel: Recent Labs  Lab 04/29/23 1443  NA 138  K 4.2  CL 108  CO2 21*  GLUCOSE 127*  BUN 32*  CREATININE 1.74*  CALCIUM 8.8*   GFR Estimated Creatinine Clearance: 21.6 mL/min (A) (by C-G formula based on SCr of 1.74 mg/dL (H)). Liver Function Tests: Recent Labs  Lab 04/29/23 1443  AST 30  ALT 39  ALKPHOS 57  BILITOT 1.0  PROT 6.9  ALBUMIN 4.1   No results for input(s): "LIPASE", "AMYLASE" in the last 168 hours. No results for input(s): "AMMONIA" in the last 168 hours. Coagulation profile No results for input(s): "INR", "PROTIME" in the last 168 hours.  CBC: Recent Labs  Lab 04/29/23 1443  WBC 4.3  NEUTROABS 3.0  HGB 13.6  HCT 44.1  MCV 86.0  PLT 137*   Cardiac Enzymes: No results for input(s): "CKTOTAL", "CKMB", "CKMBINDEX", "TROPONINI" in the last 168 hours. BNP (last 3 results) No results for input(s): "PROBNP" in the last 8760 hours. CBG: Recent Labs  Lab 04/29/23 1750 04/29/23 2029 04/30/23 0717 04/30/23 1223 04/30/23 1543  GLUCAP 170* 226* 83 91 189*   D-Dimer: No results for input(s): "DDIMER" in the last 72 hours. Hgb A1c: Recent Labs    04/29/23 1443  HGBA1C 7.8*    Lipid Profile: Recent Labs    04/29/23 1443  CHOL 144  HDL 59  LDLCALC 65  TRIG 102  CHOLHDL 2.4   Thyroid function studies: Recent Labs    04/29/23 1443  TSH 1.334   Anemia work up: No results for input(s): "VITAMINB12", "FOLATE", "FERRITIN", "TIBC", "IRON", "RETICCTPCT" in the last 72 hours. Sepsis Labs: Recent Labs  Lab 04/29/23 1443  WBC 4.3  Microbiology No results found for this or any previous visit (from the past 240 hour(s)).  Procedures and diagnostic studies:  CT HEAD WO CONTRAST ( )  Result Date: 04/30/2023 CLINICAL DATA:  Stroke, follow up EXAM: CT HEAD WITHOUT CONTRAST TECHNIQUE: Contiguous axial images were obtained from the base of the skull through the vertex without intravenous contrast. RADIATION DOSE REDUCTION: This exam was performed according to the departmental dose-optimization program which includes automated exposure control, adjustment of the mA and/or kV according to patient size and/or use of iterative reconstruction technique. COMPARISON:  CT head 04/29/2023, CT head 05/03/2019 FINDINGS: Brain: Cerebral ventricle sizes are concordant with the degree of cerebral volume loss. Patchy and confluent areas of decreased attenuation are noted throughout the deep and periventricular white matter of the cerebral hemispheres bilaterally, compatible with chronic microvascular ischemic disease. Similar-appearing left frontal, temporal, and parietooccipital stable encephalomalacia. No evidence of large-territorial acute infarction. No parenchymal hemorrhage. No mass lesion. No extra-axial collection. No mass effect or midline shift. No hydrocephalus. Basilar cisterns are patent. Vascular: No hyperdense vessel. Atherosclerotic calcifications are present within the cavernous internal carotid arteries. Skull: No acute fracture or focal lesion. Sinuses/Orbits: Paranasal sinuses and mastoid air cells are clear. Bilateral lens replacement. The orbits are unremarkable.  Other: None. IMPRESSION: No acute intracranial abnormality. Electronically Signed   By: Tish Frederickson M.D.   On: 04/30/2023 14:20   US Carotid Bilateral (at [facility] and [facility] only)  Result Date: 04/30/2023 CLINICAL DATA:  TIA. History of hypertension, hyperlipidemia and diabetes. EXAM: BILATERAL CAROTID DUPLEX ULTRASOUND TECHNIQUE: Wallace Cullens scale imaging, color Doppler and duplex ultrasound were performed of bilateral carotid and vertebral arteries in the neck. COMPARISON:  None Available. FINDINGS: Criteria: Quantification of carotid stenosis is based on velocity parameters that correlate the residual internal carotid diameter with NASCET-based stenosis levels, using the diameter of the distal internal carotid lumen as the denominator for stenosis measurement. The following velocity measurements were obtained: RIGHT ICA: 82/23 cm/sec CCA: 41/7 cm/sec SYSTOLIC ICA/CCA RATIO:  2.0 ECA: 87 cm/sec LEFT ICA: 60/12 cm/sec CCA: 43/8 cm/sec SYSTOLIC ICA/CCA RATIO:  1.4 ECA: 62 cm/sec RIGHT CAROTID ARTERY: There is a large amount of eccentric partially shadowing echogenic plaque within the right carotid bulb (image 19), extending to involve the origin and proximal aspects of the right internal carotid artery (image 27), not resulting in elevated peak systolic velocities within the interrogated course of the right internal carotid artery to suggest a hemodynamically significant stenosis. RIGHT VERTEBRAL ARTERY:  Antegrade flow LEFT CAROTID ARTERY: There is a moderate amount of eccentric echogenic partially shadowing plaque within left carotid bulb (image 63), not resulting in elevated peak systolic velocities within the left internal carotid artery to suggest a hemodynamically significant stenosis. LEFT VERTEBRAL ARTERY:  Antegrade flow IMPRESSION: Moderate-to-large amount of bilateral atherosclerotic plaque, right-greater-than-left, not resulting in a hemodynamically significant stenosis within either internal carotid artery.  Electronically Signed   By: Simonne Come M.D.   On: 04/30/2023 12:59   CT HEAD CODE STROKE WO CONTRAST`  Result Date: 04/29/2023 CLINICAL DATA:  Code stroke. Transient ischemic attack (TIA) no lateralizing sinus provided EXAM: CT HEAD WITHOUT CONTRAST TECHNIQUE: Contiguous axial images were obtained from the base of the skull through the vertex without intravenous contrast. RADIATION DOSE REDUCTION: This exam was performed according to the departmental dose-optimization program which includes automated exposure control, adjustment of the mA and/or kV according to patient size and/or use of iterative reconstruction technique. COMPARISON:  CT Head 05/03/19 FINDINGS: Brain: Compared to 2020,  there is a new, but chronic left parietal lobe infarct there is ex vacuo dilatation of the left lateral ventricle in this region no hemorrhage. No extra-axial fluid collection Vascular: No hyperdense vessel or unexpected calcification. Skull: Normal. Negative for fracture or focal lesion. Sinuses/Orbits: No middle ear or mastoid effusion. Paranasal sinuses clear. Bilateral lens replacement. Orbits are otherwise unremarkable Other: None. ASPECTS [facility] Stroke Program Early CT Score): 10 when accounting for chronic findings. IMPRESSION: 1. No hemorrhage or CT evidence of an acute cortical infarct. ASPECTS 10 when accounting for chronic findings. 2. Compared to 2020, there is a new, but chronic left parietal lobe infarct. Findings were paged to Dr.Bhagat on 04/29/23 at 2:18 PM. Electronically Signed   By: Lorenza Cambridge M.D.   On: 04/29/2023 14:19               LOS: 0 days   Reyan Helle  [facility]   Pager on [email or web address]. If 7PM-7AM, please contact night-coverage at [email or web address]     04/30/2023, 4:29 PM

## 2023-04-30 NOTE — Evaluation (Signed)
Physical Therapy Evaluation Patient Details Name: [name] MRN: [id number] DOB: 1931/07/29 Today's Date: 04/30/2023  History of Present Illness  Pt is an 89 y.o. female presenting to hospital 04/29/23 with c/o R eye droop with slurred speech and inability to hold water; concern of L sided facial droop in ED.  Recent facial rash.  Pt recently started on gabapentin for suspected post herpetic neuralgia.  Pt admitted with TIA (symptoms mostly resolved).  PMH includes SSS with pacemaker, CKD stage III, htn, HLD, DM, h/o stroke (04/2020), gastric ulcer, cervical disc disease, lumbar spine stenosis, urge incontinence, COPD, MVP, back sx.  Clinical Impression  Prior to hospital admission, pt was modified independent ambulating with RW; lives with her daughter in 1 level home with 1 STE R railing; daughter assists with medications, meals, and cleaning; daughter reports she is able to provide assist as needed at home.  Currently pt is modified independent with bed mobility; SBA with transfers; and CGA to SBA ambulating 190 feet with RW use.  Pt appears close to reported functional mobility baseline.  Upon hospital discharge, no further PT needs anticipated.     Assistance Recommended at Discharge Intermittent Supervision/Assistance  If plan is discharge home, recommend the following:  Can travel by private vehicle  A little help with walking and/or transfers;A little help with bathing/dressing/bathroom;Assistance with cooking/housework;Direct supervision/assist for medications management;Assist for transportation;Help with stairs or ramp for entrance        Equipment Recommendations Rolling walker (2 wheels) (pt has RW at home already)  Recommendations for Other Services       Functional Status Assessment Patient has had a recent decline in their functional status and demonstrates the ability to make significant improvements in function in a reasonable and predictable amount of time.      Precautions / Restrictions Precautions Precautions: Fall Restrictions Weight Bearing Restrictions: No      Mobility  Bed Mobility Overal bed mobility: Modified Independent (HOB elevated)             General bed mobility comments: No difficulties noted semi-supine to/from sitting    Transfers Overall transfer level: Needs assistance Equipment used: Rolling walker (2 wheels) Transfers: Sit to/from Stand Sit to Stand: Supervision           General transfer comment: mild increased effort to stand but steady (x2 trials from bed)    Ambulation/Gait Ambulation/Gait assistance: Min guard, Supervision Gait Distance (Feet): 190 Feet Assistive device: Rolling walker (2 wheels) Gait Pattern/deviations: Step-through pattern, Decreased step length - right, Decreased step length - left Gait velocity: decreased     General Gait Details: steady ambulation with RW use; forward flexed trunk posture noted with pt pushing walker more in front of her  Stairs            Wheelchair Mobility     Tilt Bed    Modified Rankin (Stroke Patients Only)       Balance Overall balance assessment: Needs assistance Sitting-balance support: No upper extremity supported, Feet supported Sitting balance-Leahy Scale: Good Sitting balance - Comments: steady reaching within BOS   Standing balance support: Bilateral upper extremity supported, During functional activity, Reliant on assistive device for balance Standing balance-Leahy Scale: Good Standing balance comment: steady ambulating with RW use                             Pertinent Vitals/Pain Pain Assessment Pain Assessment: No/denies pain Vitals (HR and SpO2 on  room air) stable and WFL throughout treatment session.    Home Living Family/patient expects to be discharged to:: Private residence Living Arrangements: Children (pt's daughter) Available Help at Discharge: Family;Available 24 hours/day Type of Home:  House Home Access: Stairs to enter Entrance Stairs-Rails: Right Entrance Stairs-Number of Steps: 1   Home Layout: One level Home Equipment: Agricultural consultant (2 wheels);Tub bench;BSC/3in1      Prior Function Prior Level of Function : Needs assist             Mobility Comments: Modified independent ambulating with RW. ADLs Comments: Daughter assists with medication, meals, cleaning.     Hand Dominance        Extremity/Trunk Assessment   Upper Extremity Assessment Upper Extremity Assessment: Overall WFL for tasks assessed;Defer to OT evaluation    Lower Extremity Assessment Lower Extremity Assessment: Generalized weakness (at least 4/5 AROM B hip flexion, knee flexion/extension, and DF)    Cervical / Trunk Assessment Cervical / Trunk Assessment: Normal  Communication   Communication: No difficulties  Cognition Arousal/Alertness: Awake/alert Behavior During Therapy: WFL for tasks assessed/performed Overall Cognitive Status: Within Functional Limits for tasks assessed                                          General Comments  Nursing cleared pt for participation in physical therapy.  Pt agreeable to PT session.  Pt's daughter present during session.    Exercises     Assessment/Plan    PT Assessment Patient needs continued PT services  PT Problem List Decreased strength;Decreased activity tolerance;Decreased balance;Decreased mobility       PT Treatment Interventions DME instruction;Gait training;Stair training;Functional mobility training;Therapeutic activities;Therapeutic exercise;Balance training;Patient/family education    PT Goals (Current goals can be found in the Care Plan section)  Acute Rehab PT Goals Patient Stated Goal: to go home PT Goal Formulation: With patient/family Time For Goal Achievement: 05/14/23 Potential to Achieve Goals: Good    Frequency Min 2X/week     Co-evaluation               AM-PAC PT "6 Clicks"  Mobility  Outcome Measure Help needed turning from your back to your side while in a flat bed without using bedrails?: None Help needed moving from lying on your back to sitting on the side of a flat bed without using bedrails?: None Help needed moving to and from a bed to a chair (including a wheelchair)?: A Little Help needed standing up from a chair using your arms (e.g., wheelchair or bedside chair)?: A Little Help needed to walk in hospital room?: A Little Help needed climbing 3-5 steps with a railing? : A Little 6 Click Score: 20    End of Session Equipment Utilized During Treatment: Gait belt Activity Tolerance: Patient tolerated treatment well Patient left: in bed;with call bell/phone within reach;with bed alarm set;with family/visitor present Nurse Communication: Mobility status;Precautions PT Visit Diagnosis: Other abnormalities of gait and mobility (R26.89);Muscle weakness (generalized) (M62.81)    Time: 0981-1914 PT Time Calculation (min) (ACUTE ONLY): 13 min   Charges:   PT Evaluation $PT Eval Low Complexity: 1 Low   PT General Charges $$ ACUTE PT VISIT: 1 Visit        Hendricks Limes, PT 04/30/23, 3:16 PM

## 2023-04-30 NOTE — Progress Notes (Addendum)
SLP Cancellation Note  Patient Details Name: [name] MRN: [id number] DOB: 16-Dec-1930   Cancelled treatment:       Reason Eval/Treat Not Completed: SLP screened, no needs identified, will sign off (chart reviewed; consulted NSG then met w/ pt and Dtr n room.)  Pt awake/alert and talkative this morning post eating breakfast; Daughter present in room. Pt denied any difficulty swallowing and is currently on a regular diet; tolerates swallowing pills w/ water per NSG.  Pt conversed in general conversation w/out overt expressive/receptive deficits noted; pt denied any speech-language deficits. Speech clear, intelligible. No overt OM weakness noted.  Head CT revealed No hemorrhage or CT evidence of an acute cortical infarct; Chronic atrophy and small-vessel ischemic changes throughout the brain and Chronic left parietal lobe infarct.  No further skilled ST services indicated as pt appears at her communication baseline. Pt agreed. NSG to reconsult if any change in status while admitted.       Jerilynn Som, MS, CCC-SLP Speech Language Pathologist Rehab Services; [facility] [phone] (ascom) Emma Schupp 04/30/2023, 8:53 AM

## 2023-04-30 NOTE — Progress Notes (Signed)
Neurology Progress Note  Subjective: - No further events, no acute complaints, feels at baseline, eager to go home  Exam: Current vital signs: BP (!) 156/64 (BP Location: Right Arm)   Pulse 63   Temp 97.6 F (36.4 C) (Oral)   Resp 18   Ht 5\' 3"  (1.6 m)   Wt 77.6 kg   SpO2 99%   BMI 30.29 kg/m  Vital signs in last 24 hours: Temp:  [97.6 F (36.4 C)-98.5 F (36.9 C)] 97.6 F (36.4 C) (07/04 0717) Pulse Rate:  [62-93] 63 (07/04 0717) Resp:  [13-18] 18 (07/04 0717) BP: (152-216)/(64-104) 156/64 (07/04 0717) SpO2:  [96 %-100 %] 99 % (07/04 0717) Weight:  [77.6 kg] 77.6 kg (07/03 1349)   Gen: In bed, comfortable  Resp: non-labored breathing, no grossly audible wheezing Cardiac: Perfusing extremities well  Abd: soft, nt  Neuro: MS: Awake, alert, follows simple commands CN: Pupils equal round reactive to light, orients to visual Fields in all quadrants but does not appear to have good peripheral vision and does not correctly report number of fingers without orienting.  Face symmetric (possible very subtle left facial droop), tongue midline Motor: Mild pronation of the right upper extremity without drift.  No pronation on the left.  Able to hold bilateral legs with good antigravity strength for at least 5 seconds Sensory: Intact to light touch throughout Coordination: Intact finger-nose bilaterally  Pertinent Labs:   Basic Metabolic Panel: Recent Labs  Lab 04/29/23 1443  NA 138  K 4.2  CL 108  CO2 21*  GLUCOSE 127*  BUN 32*  CREATININE 1.74*  CALCIUM 8.8*    CBC: Recent Labs  Lab 04/29/23 1443  WBC 4.3  NEUTROABS 3.0  HGB 13.6  HCT 44.1  MCV 86.0  PLT 137*    Coagulation Studies: No results for input(s): "LABPROT", "INR" in the last 72 hours.    A1c 7.8% RPR neg, TSH normal, ESR normal, LDL meeting goal 65  Carotid US Moderate-to-large amount of bilateral atherosclerotic plaque, right-greater-than-left, not resulting in a  hemodynamically significant stenosis within either internal carotid artery.  Repeat Head CT personally reviewed, agree with radiology, no acute intracranial process   Impression: Possible TIA versus dementia related fluctuation versus mild toxic/metabolic encephalopathy in the setting of elevated BUN (uremia).  Recommendations: - Continue home anti-lipid therapy given LDL meeting goal  - Recommend tighter glucose control, target A1c < 7%  - Carotid ultrasound shows no actionable lesion - If ECHO is positive for left atrial enlargement please order outpatient event monitor  - Continue home clopidogrel, add aspirin 81 mg daily for 21-day course - No further inpatient neurological testing, I will sign off at this time but do not hesitate to reach out if any questions or concerns arise  Brooke Dare MD-PhD [facility] [phone]  [facility] coverage for [facility] is from 8 AM to 4 AM in-house and 4 PM to 8 PM by telephone/video. 8 PM to 8 AM emergent questions or overnight urgent questions should be addressed to Teleneurology On-call or [facility] neurohospitalist; contact information can be found on AMION

## 2023-04-30 NOTE — Evaluation (Signed)
Occupational Therapy Evaluation Patient Details Name: [name] MRN: [id number] DOB: May 01, 1931 Today's Date: 04/30/2023   History of Present Illness Pt is an 89 y.o. female presenting to hospital 04/29/23 with c/o R eye droop with slurred speech and inability to hold water; concern of L sided facial droop in ED.  Recent facial rash.  Pt recently started on gabapentin for suspected post herpetic neuralgia.  Pt admitted with TIA (symptoms mostly resolved).  PMH includes SSS with pacemaker, CKD stage III, htn, HLD, DM, h/o stroke (04/2020), gastric ulcer, cervical disc disease, lumbar spine stenosis, urge incontinence, COPD, MVP, back sx.   Clinical Impression   Pt seen for OT evaluation this date, daughter present during evaluation.  Pt lives at home with her daughter in a one story home with one step to enter with a handrail on the right side.  She was modified independent prior to admission with basic self care tasks and also performed select homemaking tasks such as sweeping, folding clothes, making a sandwich.  Her daughter assists with cooking, medication management and cleaning.   Pt reports she feels she is at her baseline level of care, denies any weakness today and is able to demonstrate toileting and dressing with modified independence.  She does not required skilled OT services at this time.       Recommendations for follow up therapy are one component of a multi-disciplinary discharge planning process, led by the attending physician.  Recommendations may be updated based on patient status, additional functional criteria and insurance authorization.   Assistance Recommended at Discharge Intermittent Supervision/Assistance  Patient can return home with the following Assistance with cooking/housework;Direct supervision/assist for medications management;Assist for transportation    Functional Status Assessment  Patient has had a recent decline in their functional status and  demonstrates the ability to make significant improvements in function in a reasonable and predictable amount of time.  Equipment Recommendations       Recommendations for Other Services       Precautions / Restrictions Precautions Precautions: Fall Restrictions Weight Bearing Restrictions: No      Mobility Bed Mobility Overal bed mobility: Modified Independent                  Transfers Overall transfer level: Modified independent Equipment used: Rolling walker (2 wheels) Transfers: Sit to/from Stand Sit to Stand: Modified independent (Device/Increase time)                  Balance Overall balance assessment: Needs assistance Sitting-balance support: No upper extremity supported, Feet supported Sitting balance-Leahy Scale: Good     Standing balance support: Bilateral upper extremity supported, During functional activity, Reliant on assistive device for balance Standing balance-Leahy Scale: Good                             ADL either performed or assessed with clinical judgement   ADL Overall ADL's : Modified independent;At baseline                                       General ADL Comments: Pt able to demonstrate toileting, lower body dressing and feels she is back to her baseline level of care.     Vision Baseline Vision/History: 1 Wears glasses       Perception     Praxis      Pertinent  Vitals/Pain Pain Assessment Pain Assessment: No/denies pain     Hand Dominance Right   Extremity/Trunk Assessment Upper Extremity Assessment Upper Extremity Assessment: Overall WFL for tasks assessed   Lower Extremity Assessment Lower Extremity Assessment: Defer to PT evaluation   Cervical / Trunk Assessment Cervical / Trunk Assessment: Normal   Communication Communication Communication: No difficulties   Cognition Arousal/Alertness: Awake/alert Behavior During Therapy: WFL for tasks assessed/performed Overall  Cognitive Status: Within Functional Limits for tasks assessed                                       General Comments       Exercises     Shoulder Instructions      Home Living Family/patient expects to be discharged to:: Private residence Living Arrangements: Children Available Help at Discharge: Family;Available 24 hours/day Type of Home: House Home Access: Stairs to enter [employer] of Steps: 1 Entrance Stairs-Rails: Right Home Layout: One level     Bathroom Shower/Tub: Chief Strategy Officer: Handicapped height     Home Equipment: Agricultural consultant (2 wheels);Tub bench;BSC/3in1   Additional Comments: Prior to admission, pt able to complete basic self care, sweep floors, fold clothes and make herself a sandwich/snack.  Daughter performs heavier household chores.      Prior Functioning/Environment Prior Level of Function : Needs assist             Mobility Comments: Modified independent ambulating with RW. ADLs Comments: Daughter assists with medication, meals, cleaning.        OT Problem List: Decreased strength      OT Treatment/Interventions:      OT Goals(Current goals can be found in the care plan section) Acute Rehab OT Goals Patient Stated Goal: to go back home and do as much for herself as she can OT Goal Formulation: With patient/family Time For Goal Achievement: 05/09/23 Potential to Achieve Goals: Good  OT Frequency:      Co-evaluation              AM-PAC OT "6 Clicks" Daily Activity     Outcome Measure Help from another person eating meals?: None Help from another person taking care of personal grooming?: None Help from another person toileting, which includes using toliet, bedpan, or urinal?: None Help from another person bathing (including washing, rinsing, drying)?: None Help from another person to put on and taking off regular upper body clothing?: None Help from another person to put on and  taking off regular lower body clothing?: None 6 Click Score: 24   End of Session Equipment Utilized During Treatment: Gait belt;Rolling walker (2 wheels)  Activity Tolerance: Patient tolerated treatment well Patient left: in bed;with call bell/phone within reach;with family/visitor present  OT Visit Diagnosis: Muscle weakness (generalized) (M62.81)                Time: 1001-1025 OT Time Calculation (min): 24 min Charges:  OT General Charges $OT Visit: 1 Visit OT Evaluation $OT Eval Low Complexity: 1 Low  Rona Tomson T Livian Vanderbeck, OTR/L, CLT Luca Dyar 04/30/2023, 3:24 PM

## 2023-05-01 ENCOUNTER — Observation Stay (HOSPITAL_BASED_OUTPATIENT_CLINIC_OR_DEPARTMENT_OTHER)
Admit: 2023-05-01 | Discharge: 2023-05-01 | Disposition: A | Discharge status: Home / Self Care | Payer: Medicare Other | Attending: Internal Medicine | Admitting: Internal Medicine

## 2023-05-01 DIAGNOSIS — G459 Transient cerebral ischemic attack, unspecified: Secondary | ICD-10-CM | POA: Diagnosis not present

## 2023-05-01 LAB — GLUCOSE, CAPILLARY
Glucose-Capillary: 108 mg/dL — ABNORMAL HIGH (ref 70–99)
Glucose-Capillary: 157 mg/dL — ABNORMAL HIGH (ref 70–99)

## 2023-05-01 LAB — ECHOCARDIOGRAM COMPLETE
AR max vel: 1.97 cm2
AV Area VTI: 2.11 cm2
AV Area mean vel: 2.09 cm2
AV Mean grad: 5 mmHg
AV Peak grad: 8.3 mmHg
Ao pk vel: 1.44 m/s
Area-P 1/2: 2.81 cm2
Height: 63 in
MV VTI: 2.19 cm2
S' Lateral: 2 cm
Weight: 2736 oz

## 2023-05-01 MED ORDER — ASPIRIN EC 81 MG PO TBEC: 81.0000 mg | DELAYED_RELEASE_TABLET | Freq: Every day | ORAL | 0 refills | Status: AC

## 2023-05-01 NOTE — Progress Notes (Signed)
*  PRELIMINARY RESULTS* Echocardiogram 2D Echocardiogram has been performed.  Cristela Blue 05/01/2023, 10:19 AM

## 2023-05-01 NOTE — Discharge Summary (Signed)
Physician Discharge Summary   Patient: [name] MRN: [id number] DOB: 1931/01/24  Admit date:     04/29/2023  Discharge date: 05/01/23  Discharge Physician: Lurene Shadow   PCP: [name], MD   Recommendations at discharge:   Follow-up with PCP in 1 week  Discharge Diagnoses: Principal Problem:   TIA (transient ischemic attack) Active Problems:   Benign essential HTN   Type II diabetes mellitus with renal manifestations (HCC)   CKD (chronic kidney disease) stage 4, GFR 15-29 ml/min (HCC)   COPD, mild (HCC)   Postherpetic neuralgia  Resolved Problems:   * No resolved hospital problems. *  Hospital Course:  [name] is a 89 y.o. female  with medical history significant of hypertension, hyperlipidemia, insulin-dependent type 2 diabetes, SA node dysfunction s/p PPM, COPD, CKD stage IIIb, who presented to the emergency department because of altered mental status, facial droop and abnormal speech.  Patient was last seen normal on 04/29/2023 at 11 AM.  About an hour later, her daughter went back to check on her and she noted that patient was weak and slow to respond.  She handed her water but she could not hold a water with the right hand and spilled it on herself.  She could not open her right eye very well.  Her glucometer read 133 at that time.  EMS was called and she was brought to the ED for further evaluation.  In the ED, daughter stated that slurred speech and weakness had resolved but she felt the patient might have had a new left facial droop.    Assessment and Plan:  Probable TIA: Repeat CT head did not show any acute stroke.  Carotid duplex showed moderate to large amount of bilateral atherosclerotic plaque (right greater than left) but not resulting in hemodynamically significant stenosis within either internal carotid artery. 2D echo showed EF estimated at 60 to 65%, moderate LVH, grade 1 diastolic dysfunction Neurologist recommended dual antiplatelet  therapy with aspirin and Plavix for 21 days followed by Plavix monotherapy.  Continue Lipitor.     Insulin-dependent type 2 DM: Continue insulin glargine at discharge.  Hemoglobin A1c is 7.8.     Hypertension: Resume home antihypertensives.     Other comorbidities include CKD stage IV, COPD, postherpetic neuralgia          Consultants: Neurologist Procedures performed: None Disposition: Home Diet recommendation:  Discharge Diet Orders (From admission, onward)     Start     Ordered   05/01/23 0000  Diet - low sodium heart healthy        05/01/23 1332   05/01/23 0000  Diet Carb Modified        05/01/23 1332           Cardiac and Carb modified diet DISCHARGE MEDICATION: Allergies as of 05/01/2023       Reactions   Darvon [propoxyphene] Nausea And Vomiting   Hydrocodone Nausea And Vomiting   Janumet [sitagliptin-metformin Hcl] Nausea And Vomiting   Motrin [ibuprofen] Nausea And Vomiting   Tramadol Nausea And Vomiting   Can tolerate this medication in small doses (takes half a pill).        Medication List     STOP taking these medications    Levemir FlexPen 100 UNIT/ML FlexPen Generic drug: insulin detemir   Vitron-C 65-125 MG Tabs Generic drug: Iron-Vitamin C       TAKE these medications    acetaminophen 500 MG tablet Commonly known as: TYLENOL Take  1,000 mg by mouth every 6 (six) hours as needed for mild pain.   aspirin EC 81 MG tablet Take 1 tablet (81 mg total) by mouth daily for 20 days.   atorvastatin 40 MG tablet Commonly known as: LIPITOR Take 40 mg by mouth daily.   clopidogrel 75 MG tablet Commonly known as: PLAVIX Take 75 mg by mouth daily.   cyanocobalamin 500 MCG tablet Commonly known as: VITAMIN B12 Take 1,000 mcg by mouth daily.   furosemide 20 MG tablet Commonly known as: LASIX Take 10 mg by mouth daily.   gabapentin 100 MG capsule Commonly known as: NEURONTIN Take 100 mg by mouth daily as needed.   gentamicin 0.3 %  ophthalmic solution Commonly known as: GARAMYCIN   glipiZIDE 5 MG tablet Commonly known as: GLUCOTROL Take 2.5 mg by mouth daily before breakfast.   ipratropium 0.03 % nasal spray Commonly known as: ATROVENT   isosorbide mononitrate 60 MG 24 hr tablet Commonly known as: IMDUR Take 60 mg by mouth daily.   Lantus SoloStar 100 UNIT/ML Solostar Pen Generic drug: insulin glargine Inject 13 Units into the skin at bedtime.   mupirocin cream 2 % Commonly known as: BACTROBAN Apply 1 Application topically 2 (two) times daily.   nitrofurantoin 50 MG capsule Commonly known as: MACRODANTIN Take 50 mg by mouth at bedtime.   olmesartan 20 MG tablet Commonly known as: BENICAR Take 20 mg by mouth in the morning and at bedtime.   Symbicort 160-4.5 MCG/ACT inhaler Generic drug: budesonide-formoterol Inhale 2 puffs into the lungs 2 (two) times daily as needed.        Discharge Exam: Filed Weights   04/29/23 1349  Weight: 77.6 kg   GEN: NAD SKIN: Warm and dry EYES: No pallor or icterus ENT: MMM CV: RRR PULM: CTA B ABD: soft, ND, NT, +BS CNS: AAO x 2 (person and place), non focal EXT: No edema or tenderness   Condition at discharge: good  The results of significant diagnostics from this hospitalization (including imaging, microbiology, ancillary and laboratory) are listed below for reference.   Imaging Studies: ECHOCARDIOGRAM COMPLETE  Result Date: 05/01/2023    ECHOCARDIOGRAM REPORT   Patient Name:   [name] Date of Exam: 05/01/2023 Medical Rec #:  [id number]            Height:       63.0 in Accession #:    [id number]           Weight:       171.0 lb Date of Birth:  1931/02/22            BSA:          1.809 m Patient Age:    89 years             BP:           142/71 mmHg Patient Gender: F                    HR:           65 bpm. Exam Location:  [facility] Procedure: 2D Echo, Cardiac Doppler and Color Doppler Indications:     TIA G45.9  History:         Patient has no prior  history of Echocardiogram examinations.                  COPD; Risk Factors:Hypertension.  Sonographer:     Cristela Blue Referring Phys:  [id number] Cira Servant  BASARABA Diagnosing Phys: Julien Nordmann MD IMPRESSIONS  1. Left ventricular ejection fraction, by estimation, is 60 to 65%. The left ventricle has normal function. The left ventricle has no regional wall motion abnormalities. There is moderate left ventricular hypertrophy. Left ventricular diastolic parameters are consistent with Grade I diastolic dysfunction (impaired relaxation).  2. Right ventricular systolic function is normal. The right ventricular size is normal. There is mildly elevated pulmonary artery systolic pressure. The estimated right ventricular systolic pressure is 37.7 mmHg.  3. The mitral valve is normal in structure. No evidence of mitral valve regurgitation. No evidence of mitral stenosis.  4. The aortic valve has an indeterminant number of cusps. Aortic valve regurgitation is mild. Aortic valve sclerosis is present, with no evidence of aortic valve stenosis.  5. The inferior vena cava is normal in size with greater than 50% respiratory variability, suggesting right atrial pressure of 3 mmHg. FINDINGS  Left Ventricle: Left ventricular ejection fraction, by estimation, is 60 to 65%. The left ventricle has normal function. The left ventricle has no regional wall motion abnormalities. The left ventricular internal cavity size was normal in size. There is  moderate left ventricular hypertrophy. Left ventricular diastolic parameters are consistent with Grade I diastolic dysfunction (impaired relaxation). Right Ventricle: The right ventricular size is normal. No increase in right ventricular wall thickness. Right ventricular systolic function is normal. There is mildly elevated pulmonary artery systolic pressure. The tricuspid regurgitant velocity is 2.86  m/s, and with an assumed right atrial pressure of 5 mmHg, the estimated right ventricular systolic  pressure is 37.7 mmHg. Left Atrium: Left atrial size was normal in size. Right Atrium: Right atrial size was normal in size. Pericardium: There is no evidence of pericardial effusion. Mitral Valve: The mitral valve is normal in structure. No evidence of mitral valve regurgitation. No evidence of mitral valve stenosis. MV peak gradient, 3.7 mmHg. The mean mitral valve gradient is 2.0 mmHg. Tricuspid Valve: The tricuspid valve is normal in structure. Tricuspid valve regurgitation is mild . No evidence of tricuspid stenosis. Aortic Valve: The aortic valve has an indeterminant number of cusps. Aortic valve regurgitation is mild. Aortic valve sclerosis is present, with no evidence of aortic valve stenosis. Aortic valve mean gradient measures 5.0 mmHg. Aortic valve peak gradient measures 8.3 mmHg. Aortic valve area, by VTI measures 2.11 cm. Pulmonic Valve: The pulmonic valve was normal in structure. Pulmonic valve regurgitation is not visualized. No evidence of pulmonic stenosis. Aorta: The aortic root is normal in size and structure. Venous: The inferior vena cava is normal in size with greater than 50% respiratory variability, suggesting right atrial pressure of 3 mmHg. IAS/Shunts: No atrial level shunt detected by color flow Doppler. Additional Comments: A device lead is visualized.  LEFT VENTRICLE PLAX 2D LVIDd:         2.90 cm   Diastology LVIDs:         2.00 cm   LV e' medial:    4.13 cm/s LV PW:         1.30 cm   LV E/e' medial:  19.7 LV IVS:        1.60 cm   LV e' lateral:   4.57 cm/s LVOT diam:     2.00 cm   LV E/e' lateral: 17.8 LV SV:         61 LV SV Index:   34 LVOT Area:     3.14 cm  RIGHT VENTRICLE RV Basal diam:  3.30 cm RV Mid diam:  3.20 cm LEFT ATRIUM             Index        RIGHT ATRIUM          Index LA diam:        3.30 cm 1.82 cm/m   RA Area:     8.93 cm LA Vol (A2C):   20.3 ml 11.22 ml/m  RA Volume:   15.30 ml 8.46 ml/m LA Vol (A4C):   22.3 ml 12.33 ml/m LA Biplane Vol: 23.6 ml 13.05  ml/m  AORTIC VALVE AV Area (Vmax):    1.97 cm AV Area (Vmean):   2.09 cm AV Area (VTI):     2.11 cm AV Vmax:           144.00 cm/s AV Vmean:          99.400 cm/s AV VTI:            0.290 m AV Peak Grad:      8.3 mmHg AV Mean Grad:      5.0 mmHg LVOT Vmax:         90.20 cm/s LVOT Vmean:        66.000 cm/s LVOT VTI:          0.195 m LVOT/AV VTI ratio: 0.67  AORTA Ao Root diam: 3.30 cm MITRAL VALVE               TRICUSPID VALVE MV Area (PHT): 2.81 cm    TR Peak grad:   32.7 mmHg MV Area VTI:   2.19 cm    TR Vmax:        286.00 cm/s MV Peak grad:  3.7 mmHg MV Mean grad:  2.0 mmHg    SHUNTS MV Vmax:       0.97 m/s    Systemic VTI:  0.20 m MV Vmean:      58.2 cm/s   Systemic Diam: 2.00 cm MV Decel Time: 270 msec MV E velocity: 81.20 cm/s MV A velocity: 90.10 cm/s MV E/A ratio:  0.90 Julien Nordmann MD Electronically signed by Julien Nordmann MD Signature Date/Time: 05/01/2023/12:20:12 PM    Final    CT HEAD WO CONTRAST ( )  Result Date: 04/30/2023 CLINICAL DATA:  Stroke, follow up EXAM: CT HEAD WITHOUT CONTRAST TECHNIQUE: Contiguous axial images were obtained from the base of the skull through the vertex without intravenous contrast. RADIATION DOSE REDUCTION: This exam was performed according to the departmental dose-optimization program which includes automated exposure control, adjustment of the mA and/or kV according to patient size and/or use of iterative reconstruction technique. COMPARISON:  CT head 04/29/2023, CT head 05/03/2019 FINDINGS: Brain: Cerebral ventricle sizes are concordant with the degree of cerebral volume loss. Patchy and confluent areas of decreased attenuation are noted throughout the deep and periventricular white matter of the cerebral hemispheres bilaterally, compatible with chronic microvascular ischemic disease. Similar-appearing left frontal, temporal, and parietooccipital stable encephalomalacia. No evidence of large-territorial acute infarction. No parenchymal hemorrhage. No mass  lesion. No extra-axial collection. No mass effect or midline shift. No hydrocephalus. Basilar cisterns are patent. Vascular: No hyperdense vessel. Atherosclerotic calcifications are present within the cavernous internal carotid arteries. Skull: No acute fracture or focal lesion. Sinuses/Orbits: Paranasal sinuses and mastoid air cells are clear. Bilateral lens replacement. The orbits are unremarkable. Other: None. IMPRESSION: No acute intracranial abnormality. Electronically Signed   By: Tish Frederickson M.D.   On: 04/30/2023 14:20   US Carotid Bilateral (at [facility] and [facility] only)  Result  Date: 04/30/2023 CLINICAL DATA:  TIA. History of hypertension, hyperlipidemia and diabetes. EXAM: BILATERAL CAROTID DUPLEX ULTRASOUND TECHNIQUE: Wallace Cullens scale imaging, color Doppler and duplex ultrasound were performed of bilateral carotid and vertebral arteries in the neck. COMPARISON:  None Available. FINDINGS: Criteria: Quantification of carotid stenosis is based on velocity parameters that correlate the residual internal carotid diameter with NASCET-based stenosis levels, using the diameter of the distal internal carotid lumen as the denominator for stenosis measurement. The following velocity measurements were obtained: RIGHT ICA: 82/23 cm/sec CCA: 41/7 cm/sec SYSTOLIC ICA/CCA RATIO:  2.0 ECA: 87 cm/sec LEFT ICA: 60/12 cm/sec CCA: 43/8 cm/sec SYSTOLIC ICA/CCA RATIO:  1.4 ECA: 62 cm/sec RIGHT CAROTID ARTERY: There is a large amount of eccentric partially shadowing echogenic plaque within the right carotid bulb (image 19), extending to involve the origin and proximal aspects of the right internal carotid artery (image 27), not resulting in elevated peak systolic velocities within the interrogated course of the right internal carotid artery to suggest a hemodynamically significant stenosis. RIGHT VERTEBRAL ARTERY:  Antegrade flow LEFT CAROTID ARTERY: There is a moderate amount of eccentric echogenic partially shadowing plaque within  left carotid bulb (image 63), not resulting in elevated peak systolic velocities within the left internal carotid artery to suggest a hemodynamically significant stenosis. LEFT VERTEBRAL ARTERY:  Antegrade flow IMPRESSION: Moderate-to-large amount of bilateral atherosclerotic plaque, right-greater-than-left, not resulting in a hemodynamically significant stenosis within either internal carotid artery. Electronically Signed   By: Simonne Come M.D.   On: 04/30/2023 12:59   CT HEAD CODE STROKE WO CONTRAST`  Result Date: 04/29/2023 CLINICAL DATA:  Code stroke. Transient ischemic attack (TIA) no lateralizing sinus provided EXAM: CT HEAD WITHOUT CONTRAST TECHNIQUE: Contiguous axial images were obtained from the base of the skull through the vertex without intravenous contrast. RADIATION DOSE REDUCTION: This exam was performed according to the departmental dose-optimization program which includes automated exposure control, adjustment of the mA and/or kV according to patient size and/or use of iterative reconstruction technique. COMPARISON:  CT Head 05/03/19 FINDINGS: Brain: Compared to 2020, there is a new, but chronic left parietal lobe infarct there is ex vacuo dilatation of the left lateral ventricle in this region no hemorrhage. No extra-axial fluid collection Vascular: No hyperdense vessel or unexpected calcification. Skull: Normal. Negative for fracture or focal lesion. Sinuses/Orbits: No middle ear or mastoid effusion. Paranasal sinuses clear. Bilateral lens replacement. Orbits are otherwise unremarkable Other: None. ASPECTS [facility] Stroke Program Early CT Score): 10 when accounting for chronic findings. IMPRESSION: 1. No hemorrhage or CT evidence of an acute cortical infarct. ASPECTS 10 when accounting for chronic findings. 2. Compared to 2020, there is a new, but chronic left parietal lobe infarct. Findings were paged to Dr.Bhagat on 04/29/23 at 2:18 PM. Electronically Signed   By: Lorenza Cambridge M.D.   On:  04/29/2023 14:19    Microbiology: Results for orders placed or performed during the hospital encounter of 02/17/20  Urine culture     Status: Abnormal   Collection Time: 02/17/20 11:27 AM   Specimen: Urine, Catheterized  Result Value Ref Range Status   Specimen Description   Final    Urine Performed at [facility], [address] [place], [place] [address]    Special Requests   Final    NONE Performed at [facility], [address] [place], [place] [address]    Culture (A)  Final    80,000 COLONIES/mL GARDNERELLA VAGINALIS Standardized susceptibility testing for this organism is not available. Performed  at [facility], [address] [place], [place] [address]    Report Status 02/19/2020 FINAL  Final  SARS CORONAVIRUS 2 (TAT 6-24 HRS) Nasopharyngeal Nasopharyngeal Swab     Status: None   Collection Time: 02/17/20  3:24 PM   Specimen: Nasopharyngeal Swab  Result Value Ref Range Status   SARS Coronavirus 2 NEGATIVE NEGATIVE Final    Comment: (NOTE) SARS-CoV-2 target nucleic acids are NOT DETECTED. The SARS-CoV-2 RNA is generally detectable in upper and lower respiratory specimens during the acute phase of infection. Negative results do not preclude SARS-CoV-2 infection, do not rule out co-infections with other pathogens, and should not be used as the sole basis for treatment or other patient management decisions. Negative results must be combined with clinical observations, patient history, and epidemiological information. The expected result is Negative. Fact Sheet for Patients: HairSlick.no Fact Sheet for Healthcare Providers: quierodirigir.com This test is not yet approved or cleared by the [place] FDA and  has been authorized for detection and/or diagnosis of SARS-CoV-2 by FDA under an Emergency Use Authorization (EUA). This EUA will remain  in effect (meaning this test can be  used) for the duration of the COVID-19 declaration under Section 56 4(b)(1) of the Act, 21 U.S.C. section 360bbb-3(b)(1), unless the authorization is terminated or revoked sooner. Performed at [facility], [address] [place], [place] [address]     Labs: CBC: Recent Labs  Lab 04/29/23 1443  WBC 4.3  NEUTROABS 3.0  HGB 13.6  HCT 44.1  MCV 86.0  PLT 137*   Basic Metabolic Panel: Recent Labs  Lab 04/29/23 1443  NA 138  K 4.2  CL 108  CO2 21*  GLUCOSE 127*  BUN 32*  CREATININE 1.74*  CALCIUM 8.8*   Liver Function Tests: Recent Labs  Lab 04/29/23 1443  AST 30  ALT 39  ALKPHOS 57  BILITOT 1.0  PROT 6.9  ALBUMIN 4.1   CBG: Recent Labs  Lab 04/30/23 1223 04/30/23 1543 04/30/23 2002 05/01/23 0744 05/01/23 1150  GLUCAP 91 189* 237* 108* 157*    Discharge time spent: greater than 30 minutes.  Signed: Lurene Shadow, MD [facility] 05/01/2023

## 2023-05-01 NOTE — Care Management Obs Status (Signed)
MEDICARE OBSERVATION STATUS NOTIFICATION   Patient Details  Name: Lisanna Kett MRN: [id number] Date of Birth: 09-29-1931   Medicare Observation Status Notification Given:  Yes    Keyion Knack, LCSW 05/01/2023, 10:13 AM

## 2023-05-01 NOTE — Plan of Care (Signed)
  Problem: Education: Goal: Knowledge of disease or condition will improve Outcome: Completed/Met Goal: Knowledge of secondary prevention will improve (MUST DOCUMENT ALL) Outcome: Completed/Met Goal: Knowledge of patient specific risk factors will improve (Mark N/A or DELETE if not current risk factor) Outcome: Completed/Met   Problem: Ischemic Stroke/TIA Tissue Perfusion: Goal: Complications of ischemic stroke/TIA will be minimized Outcome: Completed/Met   Problem: Coping: Goal: Will verbalize positive feelings about self Outcome: Completed/Met Goal: Will identify appropriate support needs Outcome: Completed/Met   Problem: Health Behavior/Discharge Planning: Goal: Ability to manage health-related needs will improve Outcome: Completed/Met Goal: Goals will be collaboratively established with patient/family Outcome: Completed/Met   Problem: Self-Care: Goal: Ability to participate in self-care as condition permits will improve Outcome: Completed/Met Goal: Verbalization of feelings and concerns over difficulty with self-care will improve Outcome: Completed/Met Goal: Ability to communicate needs accurately will improve Outcome: Completed/Met   Problem: Nutrition: Goal: Risk of aspiration will decrease Outcome: Completed/Met Goal: Dietary intake will improve Outcome: Completed/Met   Problem: Education: Goal: Ability to describe self-care measures that may prevent or decrease complications (Diabetes Survival Skills Education) will improve Outcome: Completed/Met Goal: Individualized Educational Video(s) Outcome: Completed/Met   Problem: Coping: Goal: Ability to adjust to condition or change in health will improve Outcome: Completed/Met   Problem: Fluid Volume: Goal: Ability to maintain a balanced intake and output will improve Outcome: Completed/Met   Problem: Health Behavior/Discharge Planning: Goal: Ability to identify and utilize available resources and services will  improve Outcome: Completed/Met Goal: Ability to manage health-related needs will improve Outcome: Completed/Met   Problem: Metabolic: Goal: Ability to maintain appropriate glucose levels will improve Outcome: Completed/Met   Problem: Nutritional: Goal: Maintenance of adequate nutrition will improve Outcome: Completed/Met Goal: Progress toward achieving an optimal weight will improve Outcome: Completed/Met   Problem: Skin Integrity: Goal: Risk for impaired skin integrity will decrease Outcome: Completed/Met   Problem: Tissue Perfusion: Goal: Adequacy of tissue perfusion will improve Outcome: Completed/Met   Problem: Education: Goal: Knowledge of General Education information will improve Description: Including pain rating scale, medication(s)/side effects and non-pharmacologic comfort measures Outcome: Completed/Met   Problem: Health Behavior/Discharge Planning: Goal: Ability to manage health-related needs will improve Outcome: Completed/Met   Problem: Clinical Measurements: Goal: Ability to maintain clinical measurements within normal limits will improve Outcome: Completed/Met Goal: Will remain free from infection Outcome: Completed/Met Goal: Diagnostic test results will improve Outcome: Completed/Met Goal: Respiratory complications will improve Outcome: Completed/Met Goal: Cardiovascular complication will be avoided Outcome: Completed/Met   Problem: Activity: Goal: Risk for activity intolerance will decrease Outcome: Completed/Met   Problem: Nutrition: Goal: Adequate nutrition will be maintained Outcome: Completed/Met   Problem: Coping: Goal: Level of anxiety will decrease Outcome: Completed/Met   Problem: Elimination: Goal: Will not experience complications related to bowel motility Outcome: Completed/Met Goal: Will not experience complications related to urinary retention Outcome: Completed/Met   Problem: Pain Managment: Goal: General experience of  comfort will improve Outcome: Completed/Met   Problem: Safety: Goal: Ability to remain free from injury will improve Outcome: Completed/Met   Problem: Skin Integrity: Goal: Risk for impaired skin integrity will decrease Outcome: Completed/Met   

## 2023-06-21 ENCOUNTER — Ambulatory Visit
Admission: EM | Admit: 2023-06-21 | Discharge: 2023-06-21 | Disposition: A | Discharge status: Home / Self Care | Payer: Medicare Other | Attending: Emergency Medicine | Admitting: Emergency Medicine

## 2023-06-21 DIAGNOSIS — J309 Allergic rhinitis, unspecified: Secondary | ICD-10-CM

## 2023-06-21 MED ORDER — CETIRIZINE HCL 5 MG PO TABS: 5.0000 mg | ORAL_TABLET | Freq: Every day | ORAL | 1 refills | Status: DC

## 2023-06-21 MED ORDER — FLUTICASONE PROPIONATE 50 MCG/ACT NA SUSP: 2.0000 | Freq: Every day | NASAL | 1 refills | Status: DC

## 2023-06-21 NOTE — ED Triage Notes (Signed)
Sx x 1 month. Daughter states that patient had a staph infection around her nose in June. Patient states that her nose is burning and feels like she has staph again. Burning on the inside of her nose.

## 2023-06-21 NOTE — Discharge Instructions (Addendum)
Take the Zyrtec 5 mg tablet once daily to help with nasal allergies and burning in your nasal passages.  Perform sinus irrigation with a [employer] sinus rinse kit and distilled water 2-3 times a day to wash away pollen particles and mold spores which could be attributing to nasal congestion and mucus production.  Use the Flonase nasal spray at bedtime.  You will instill 2 squirts in each nostril.  Aim the nozzle away from your septum to prevent nosebleeds.  Return for reevaluation for any new or worsening symptoms.

## 2023-06-21 NOTE — ED Provider Notes (Signed)
[facility]    CSN: [id number] Arrival date & time: 06/21/23  7425      History   Chief Complaint Chief Complaint  Patient presents with   Nose Problem    HPI [name] is a 89 y.o. female.   HPI  89 year old female with past medical history significant for diabetes, diabetic retinopathy, COPD, and chronic kidney disease presents for evaluation of burning in her right nostril x 1 month.  In June she was treated for staph infection on the outside of her nose and she is concerned she may have 1 inside.  She denies any runny nose or sneezing.  She also denies any bleeding from her nostril or pus discharge.  No fevers.  Past Medical History:  Diagnosis Date   Cervical disc disease    Chronic kidney disease    stage 3   COPD (chronic obstructive pulmonary disease) (HCC)    Cystitis    Degenerative joint disease    Diabetes mellitus without complication (HCC)    DM type 2   Diabetic retinopathy associated with type 2 diabetes mellitus, without macular edema    Diverticulosis of colon    without mention of hemorrhage   Gastric ulcer    GERD (gastroesophageal reflux disease)    Hyperlipemia    Hypertension    Mitral valve prolapse    Premature atrial contraction    Sick sinus syndrome (HCC)    Sinoatrial node dysfunction (HCC)     Patient Active Problem List   Diagnosis Date Noted   TIA (transient ischemic attack) 04/29/2023   Postherpetic neuralgia 04/29/2023   CKD (chronic kidney disease) stage 4, GFR 15-29 ml/min (HCC) 08/19/2021   Acute metabolic encephalopathy 02/17/2020   UTI (urinary tract infection) 02/17/2020   HTN (hypertension) 02/17/2020   HLD (hyperlipidemia) 02/17/2020   Type II diabetes mellitus with renal manifestations (HCC) 02/17/2020   GERD (gastroesophageal reflux disease) 02/17/2020   Lactic acidosis 02/17/2020   History of CVA (cerebrovascular accident) 05/25/2019   Sacral insufficiency fracture 08/20/2018   History of  peptic ulcer disease 09/16/2017   Gait instability 09/16/2017   Obesity (BMI 30-39.9) 03/10/2017   Trochanteric bursitis of left hip 03/10/2017   Acute renal failure superimposed on stage 3a chronic kidney disease (HCC) 12/31/2016   Moderate tricuspid insufficiency 12/31/2016   B12 deficiency 05/20/2016   Moderate mitral insufficiency 12/25/2015   Bilateral carotid artery stenosis 11/27/2015   Chronic venous insufficiency 10/09/2015   LVH (left ventricular hypertrophy) due to hypertensive disease, with heart failure (HCC) 10/09/2015   Controlled type 2 diabetes mellitus with stage 3 chronic kidney disease (HCC) 04/06/2015   Edema leg 04/05/2015   Hypertensive left ventricular hypertrophy 04/05/2015   DDD (degenerative disc disease), lumbar 12/13/2014   Neuritis or radiculitis due to rupture of lumbar intervertebral disc 12/13/2014   Hyperlipidemia due to type 2 diabetes mellitus (HCC) 08/15/2014   COPD, mild (HCC) 08/14/2014   Incomplete bladder emptying 07/05/2014   Neuralgia neuritis, sciatic nerve 07/05/2014   Urge incontinence 07/05/2014   FOM (frequency of micturition) 07/05/2014   Benign essential HTN 06/14/2014   Lumbar canal stenosis 06/14/2014   SA node dysfunction (HCC) 06/14/2014   Hypertension associated with diabetes (HCC) 06/14/2014    Past Surgical History:  Procedure Laterality Date   ABDOMINAL HYSTERECTOMY     BACK SURGERY     lumbar back surgery   COLONOSCOPY     ESOPHAGOGASTRODUODENOSCOPY     gastric ulcer surgery  KIDNEY SURGERY     pacemaker     insertion dual chamber pacemaker generator   PARATHYROIDECTOMY     THYROIDECTOMY, PARTIAL     lobectomy partial thyroid    OB History   No obstetric history on file.      Home Medications    Prior to Admission medications   Medication Sig Start Date End Date Taking? Authorizing Provider  acetaminophen (TYLENOL) 500 MG tablet Take 1,000 mg by mouth every 6 (six) hours as needed for mild pain.   Yes  [provider]  atorvastatin (LIPITOR) 40 MG tablet Take 40 mg by mouth daily.    Yes [provider]  budesonide-formoterol (SYMBICORT) 160-4.5 MCG/ACT inhaler Inhale 2 puffs into the lungs 2 (two) times daily as needed.  07/17/17  Yes [provider]  cetirizine (ZYRTEC) 5 MG tablet Take 1 tablet (5 mg total) by mouth daily. 06/21/23  Yes Becky Augusta, NP  clopidogrel (PLAVIX) 75 MG tablet Take 75 mg by mouth daily. 02/17/20  Yes [provider]  fluticasone (FLONASE) 50 MCG/ACT nasal spray Place 2 sprays into both nostrils daily. 06/21/23  Yes Becky Augusta, NP  furosemide (LASIX) 20 MG tablet Take 10 mg by mouth daily. 10/29/19  Yes [provider]  gabapentin (NEURONTIN) 100 MG capsule Take 100 mg by mouth daily as needed. 04/27/23 04/26/24 Yes [provider]  gentamicin (GARAMYCIN) 0.3 % ophthalmic solution  12/01/19  Yes [provider]  glipiZIDE (GLUCOTROL) 5 MG tablet Take 2.5 mg by mouth daily before breakfast. 01/08/23 01/08/24 Yes [provider]  ipratropium (ATROVENT) 0.03 % nasal spray  11/16/19  Yes [provider]  isosorbide mononitrate (IMDUR) 60 MG 24 hr tablet Take 60 mg by mouth daily.    Yes [provider]  LANTUS SOLOSTAR 100 UNIT/ML Solostar Pen Inject 13 Units into the skin at bedtime.   Yes [provider]  mupirocin cream (BACTROBAN) 2 % Apply 1 Application topically 2 (two) times daily. 03/30/23  Yes Radford Pax, NP  nitrofurantoin (MACRODANTIN) 50 MG capsule Take 50 mg by mouth at bedtime.   Yes [provider]  olmesartan (BENICAR) 20 MG tablet Take 20 mg by mouth in the morning and at bedtime. 02/17/20  Yes [provider]  vitamin B-12 (CYANOCOBALAMIN) 500 MCG tablet Take 1,000 mcg by mouth daily.    Yes [provider]    Family History Family History  Problem Relation Age of Onset   Breast cancer Mother 58   Breast cancer Other    Breast cancer  Maternal Aunt        2 mat aunts    Social History Social History   Tobacco Use   Smoking status: Never   Smokeless tobacco: Never  Vaping Use   Vaping status: Never Used  Substance Use Topics   Alcohol use: No    Alcohol/week: 0.0 standard drinks of alcohol   Drug use: No     Allergies   Darvon [propoxyphene], Hydrocodone, Janumet [sitagliptin-metformin hcl], Motrin [ibuprofen], and Tramadol   Review of Systems Review of Systems  HENT:  Negative for congestion, postnasal drip, rhinorrhea and sneezing.        Burning and right nasal passage.     Physical Exam Triage Vital Signs ED Triage Vitals  Encounter Vitals Group     BP 06/21/23 1028 (!) 175/97     Systolic BP Percentile --      Diastolic BP Percentile --  Pulse Rate 06/21/23 1028 82     Resp 06/21/23 1028 16     Temp 06/21/23 1028 98.3 F (36.8 C)     Temp src --      SpO2 06/21/23 1028 100 %     Weight 06/21/23 1027 170 lb (77.1 kg)     Height --      Head Circumference --      Peak Flow --      Pain Score --      Pain Loc --      Pain Education --      Exclude from Growth Chart --    No data found.  Updated Vital Signs BP (!) 175/97 (BP Location: Left Arm)   Pulse 82   Temp 98.3 F (36.8 C)   Resp 16   Wt 170 lb (77.1 kg)   SpO2 100%   BMI 30.11 kg/m   Visual Acuity Right Eye Distance:   Left Eye Distance:   Bilateral Distance:    Right Eye Near:   Left Eye Near:    Bilateral Near:     Physical Exam Vitals and nursing note reviewed.  Constitutional:      Appearance: Normal appearance. She is not ill-appearing.  HENT:     Head: Normocephalic and atraumatic.     Nose: Congestion and rhinorrhea present.     Comments: Nasal mucosa is edematous and pale in color with a slight bluish hue.  There is some clear nasal discharge present.  No redness or nasal sores identified on visual exam. Skin:    General: Skin is warm and dry.     Capillary Refill: Capillary refill takes less  than 2 seconds.  Neurological:     General: No focal deficit present.     Mental Status: She is alert and oriented to person, place, and time.      UC Treatments / Results  Labs (all labs ordered are listed, but only abnormal results are displayed) Labs Reviewed - No data to display  EKG   Radiology No results found.  Procedures Procedures (including critical care time)  Medications Ordered in UC Medications - No data to display  Initial Impression / Assessment and Plan / UC Course  I have reviewed the triage vital signs and the nursing notes.  Pertinent labs & imaging results that were available during my care of the patient were reviewed by me and considered in my medical decision making (see chart for details).   Patient is a nontoxic-appearing 89 year old female presenting for evaluation of burning in her right nasal passage x 1 month.  On exam she does not have any intranasal lesions or erythema identified.  Her nasal mucosa is edematous and pale with a slight bluish hue suggesting allergic rhinitis.  She states she has not had any runny nose or sneezing.  I will do a trial of Zyrtec 10 mg daily combined with Flonase 2 squirts at bedtime to see if her symptoms improve.   Final Clinical Impressions(s) / UC Diagnoses   Final diagnoses:  Allergic rhinitis, unspecified seasonality, unspecified trigger     Discharge Instructions      Take the Zyrtec 5 mg tablet once daily to help with nasal allergies and burning in your nasal passages.  Perform sinus irrigation with a [employer] sinus rinse kit and distilled water 2-3 times a day to wash away pollen particles and mold spores which could be attributing to nasal congestion and mucus production.  Use the Flonase  nasal spray at bedtime.  You will instill 2 squirts in each nostril.  Aim the nozzle away from your septum to prevent nosebleeds.  Return for reevaluation for any new or worsening symptoms.      ED  Prescriptions     Medication Sig Dispense Auth. Provider   fluticasone (FLONASE) 50 MCG/ACT nasal spray Place 2 sprays into both nostrils daily. 18.2 mL Becky Augusta, NP   cetirizine (ZYRTEC) 5 MG tablet Take 1 tablet (5 mg total) by mouth daily. 30 tablet Becky Augusta, NP      PDMP not reviewed this encounter.   Becky Augusta, NP 06/21/23 1116

## 2023-09-09 ENCOUNTER — Ambulatory Visit
Admission: EM | Admit: 2023-09-09 | Discharge: 2023-09-09 | Disposition: A | Discharge status: Home / Self Care | Payer: Medicare Other | Attending: Family Medicine | Admitting: Family Medicine

## 2023-09-09 DIAGNOSIS — B9789 Other viral agents as the cause of diseases classified elsewhere: Secondary | ICD-10-CM | POA: Insufficient documentation

## 2023-09-09 DIAGNOSIS — Z1152 Encounter for screening for COVID-19: Secondary | ICD-10-CM | POA: Diagnosis not present

## 2023-09-09 DIAGNOSIS — R059 Cough, unspecified: Secondary | ICD-10-CM | POA: Diagnosis present

## 2023-09-09 DIAGNOSIS — J069 Acute upper respiratory infection, unspecified: Secondary | ICD-10-CM | POA: Insufficient documentation

## 2023-09-09 LAB — RESP PANEL BY RT-PCR (RSV, FLU A&B, COVID)  RVPGX2
Influenza A by PCR: NEGATIVE
Influenza B by PCR: NEGATIVE
Resp Syncytial Virus by PCR: NEGATIVE
SARS Coronavirus 2 by RT PCR: NEGATIVE

## 2023-09-09 MED ORDER — PROMETHAZINE-DM 6.25-15 MG/5ML PO SYRP: 2.5000 mL | ORAL_SOLUTION | Freq: Four times a day (QID) | ORAL | 0 refills | Status: DC | PRN

## 2023-09-09 NOTE — ED Provider Notes (Signed)
[facility]    CSN: [id number] Arrival date & time: 09/09/23  1600      History   Chief Complaint Chief Complaint  Patient presents with   Cough   Nasal Congestion   Fatigue    HPI [name] is a 89 y.o. female.   HPI  History obtained from the patient and daughter . [name] presents for cough for the past 2-3 days.  Her female family member and grandson had a cold. Took some prescription cough medication but she has run out.  Cough gets worse at night. No fever, shortness of breath.    No history of smoking or asthma.  She has an inhaler at home but doesn't use it.     Past Medical History:  Diagnosis Date   Cervical disc disease    Chronic kidney disease    stage 3   COPD (chronic obstructive pulmonary disease) (HCC)    Cystitis    Degenerative joint disease    Diabetes mellitus without complication (HCC)    DM type 2   Diabetic retinopathy associated with type 2 diabetes mellitus, without macular edema    Diverticulosis of colon    without mention of hemorrhage   Gastric ulcer    GERD (gastroesophageal reflux disease)    Hyperlipemia    Hypertension    Mitral valve prolapse    Premature atrial contraction    Sick sinus syndrome (HCC)    Sinoatrial node dysfunction (HCC)     Patient Active Problem List   Diagnosis Date Noted   TIA (transient ischemic attack) 04/29/2023   Postherpetic neuralgia 04/29/2023   CKD (chronic kidney disease) stage 4, GFR 15-29 ml/min (HCC) 08/19/2021   Acute metabolic encephalopathy 02/17/2020   UTI (urinary tract infection) 02/17/2020   HTN (hypertension) 02/17/2020   HLD (hyperlipidemia) 02/17/2020   Type II diabetes mellitus with renal manifestations (HCC) 02/17/2020   GERD (gastroesophageal reflux disease) 02/17/2020   Lactic acidosis 02/17/2020   History of CVA (cerebrovascular accident) 05/25/2019   Sacral insufficiency fracture 08/20/2018   History of peptic ulcer disease 09/16/2017   Gait  instability 09/16/2017   Obesity (BMI 30-39.9) 03/10/2017   Trochanteric bursitis of left hip 03/10/2017   Acute renal failure superimposed on stage 3a chronic kidney disease (HCC) 12/31/2016   Moderate tricuspid insufficiency 12/31/2016   B12 deficiency 05/20/2016   Moderate mitral insufficiency 12/25/2015   Bilateral carotid artery stenosis 11/27/2015   Chronic venous insufficiency 10/09/2015   LVH (left ventricular hypertrophy) due to hypertensive disease, with heart failure (HCC) 10/09/2015   Controlled type 2 diabetes mellitus with stage 3 chronic kidney disease (HCC) 04/06/2015   Edema leg 04/05/2015   Hypertensive left ventricular hypertrophy 04/05/2015   DDD (degenerative disc disease), lumbar 12/13/2014   Neuritis or radiculitis due to rupture of lumbar intervertebral disc 12/13/2014   Hyperlipidemia due to type 2 diabetes mellitus (HCC) 08/15/2014   COPD, mild (HCC) 08/14/2014   Incomplete bladder emptying 07/05/2014   Neuralgia neuritis, sciatic nerve 07/05/2014   Urge incontinence 07/05/2014   FOM (frequency of micturition) 07/05/2014   Benign essential HTN 06/14/2014   Lumbar canal stenosis 06/14/2014   SA node dysfunction (HCC) 06/14/2014   Hypertension associated with diabetes (HCC) 06/14/2014    Past Surgical History:  Procedure Laterality Date   ABDOMINAL HYSTERECTOMY     BACK SURGERY     lumbar back surgery   COLONOSCOPY     ESOPHAGOGASTRODUODENOSCOPY     gastric ulcer surgery  KIDNEY SURGERY     pacemaker     insertion dual chamber pacemaker generator   PARATHYROIDECTOMY     THYROIDECTOMY, PARTIAL     lobectomy partial thyroid    OB History   No obstetric history on file.      Home Medications    Prior to Admission medications   Medication Sig Start Date End Date Taking? Authorizing Provider  atorvastatin (LIPITOR) 40 MG tablet Take 40 mg by mouth daily.    Yes [provider]  clopidogrel (PLAVIX) 75 MG tablet Take 75 mg by mouth  daily. 02/17/20  Yes [provider]  furosemide (LASIX) 20 MG tablet Take 10 mg by mouth daily. 10/29/19  Yes [provider]  glipiZIDE (GLUCOTROL) 5 MG tablet Take 2.5 mg by mouth daily before breakfast. 01/08/23 01/08/24 Yes [provider]  isosorbide mononitrate (IMDUR) 60 MG 24 hr tablet Take 60 mg by mouth daily.    Yes [provider]  JARDIANCE 10 MG TABS tablet Take 10 mg by mouth daily. 08/21/23  Yes [provider]  nitrofurantoin (MACRODANTIN) 50 MG capsule Take 50 mg by mouth at bedtime.   Yes [provider]  olmesartan (BENICAR) 20 MG tablet Take 20 mg by mouth in the morning and at bedtime. 02/17/20  Yes [provider]  promethazine-dextromethorphan (PROMETHAZINE-DM) 6.25-15 MG/5ML syrup Take 2.5-5 mLs by mouth 4 (four) times daily as needed. 09/09/23  Yes Caylei Sperry, Seward Meth, DO  acetaminophen (TYLENOL) 500 MG tablet Take 1,000 mg by mouth every 6 (six) hours as needed for mild pain.    [provider]  budesonide-formoterol (SYMBICORT) 160-4.5 MCG/ACT inhaler Inhale 2 puffs into the lungs 2 (two) times daily as needed.  07/17/17   [provider]  cetirizine (ZYRTEC) 5 MG tablet Take 1 tablet (5 mg total) by mouth daily. 06/21/23   Becky Augusta, NP  fluticasone [facility]) 50 MCG/ACT nasal spray Place 2 sprays into both nostrils daily. 06/21/23   Becky Augusta, NP  gabapentin (NEURONTIN) 100 MG capsule Take 100 mg by mouth daily as needed. 04/27/23 04/26/24  [provider]  gentamicin (GARAMYCIN) 0.3 % ophthalmic solution  12/01/19   [provider]  ipratropium (ATROVENT) 0.03 % nasal spray  11/16/19   [provider]  LANTUS SOLOSTAR 100 UNIT/ML Solostar Pen Inject 13 Units into the skin at bedtime.    [provider]  mupirocin cream (BACTROBAN) 2 % Apply 1 Application topically 2 (two) times daily. 03/30/23   Radford Pax, NP  vitamin B-12 (CYANOCOBALAMIN) 500 MCG tablet Take 1,000  mcg by mouth daily.     [provider]    Family History Family History  Problem Relation Age of Onset   Breast cancer Mother 63   Breast cancer Other    Breast cancer Maternal Aunt        2 mat aunts    Social History Social History   Tobacco Use   Smoking status: Never   Smokeless tobacco: Never  Vaping Use   Vaping status: Never Used  Substance Use Topics   Alcohol use: No    Alcohol/week: 0.0 standard drinks of alcohol   Drug use: No     Allergies   Darvon [propoxyphene], Hydrocodone, Janumet [sitagliptin-metformin hcl], Motrin [ibuprofen], and Tramadol   Review of Systems Review of Systems: negative unless otherwise stated in HPI.      Physical Exam Triage Vital Signs ED Triage Vitals  Encounter Vitals Group     BP  09/09/23 1701 (!) 157/96     Systolic BP Percentile --      Diastolic BP Percentile --      Pulse Rate 09/09/23 1701 87     Resp 09/09/23 1701 18     Temp 09/09/23 1701 98.4 F (36.9 C)     Temp Source 09/09/23 1701 Oral     SpO2 09/09/23 1701 99 %     Weight --      Height --      Head Circumference --      Peak Flow --      Pain Score 09/09/23 1659 0     Pain Loc --      Pain Education --      Exclude from Growth Chart --    No data found.  Updated Vital Signs BP (!) 157/96 (BP Location: Left Arm)   Pulse 87   Temp 98.4 F (36.9 C) (Oral)   Resp 18   SpO2 99%   Visual Acuity Right Eye Distance:   Left Eye Distance:   Bilateral Distance:    Right Eye Near:   Left Eye Near:    Bilateral Near:     Physical Exam GEN:     alert, non-toxic appearing elderly  female in no distress    HENT:  mucus membranes moist, clear nasal discharge EYES:   pupils equal and reactive, no scleral injection or discharge RESP:  no increased work of breathing, clear to auscultation bilaterally CVS:   regular rate and rhythm Skin:   warm and dry    UC Treatments / Results  Labs (all labs ordered are listed, but only abnormal  results are displayed) Labs Reviewed  RESP PANEL BY RT-PCR (RSV, FLU A&B, COVID)  [id number]    EKG   Radiology No results found.  Procedures Procedures (including critical care time)  Medications Ordered in UC Medications - No data to display  Initial Impression / Assessment and Plan / UC Course  I have reviewed the triage vital signs and the nursing notes.  Pertinent labs & imaging results that were available during my care of the patient were reviewed by me and considered in my medical decision making (see chart for details).       Pt is a 89 y.o. female who presents for 2 days of respiratory symptoms. Lakyla is afebrile here without recent antipyretics. Satting well on room air. Overall pt is non-toxic appearing, well hydrated, without respiratory distress. Pulmonary exam is unremarkable.  COVID, influenza and RSV testing obtained and was negative. History consistent with viral respiratory illness. Discussed symptomatic treatment.  Explained lack of efficacy of antibiotics in viral disease.  Typical duration of symptoms discussed.  Promethazine DM for cough.  Return and ED precautions given and voiced understanding. Discussed MDM, treatment plan and plan for follow-up with patient and her daughter who agree with plan.     Final Clinical Impressions(s) / UC Diagnoses   Final diagnoses:  Viral URI with cough     Discharge Instructions      Tacori's COVID, influenza and RSV test were all negative.  She has not had a fever or significantly productive cough for very long I do not do not think she has pneumonia at this time.  She starts having fever the cough does not go away or gets worse, return to the urgent care or see your primary care doctor.  I suspect that she has a viral respiratory infection that will gradually improve.  Use  her Symbicort inhaler at bedtime and then as needed.  Stop by the pharmacy to pick up her cough syrup.  Use the cough syrup mostly at night but if  she needs to use it during the day decrease the dose so she does not get too sleepy.     ED Prescriptions     Medication Sig Dispense Auth. Provider   promethazine-dextromethorphan (PROMETHAZINE-DM) 6.25-15 MG/5ML syrup Take 2.5-5 mLs by mouth 4 (four) times daily as needed. 118 mL Katha Cabal, DO      PDMP not reviewed this encounter.   Katha Cabal, DO 09/09/23 Rickey Primus

## 2023-09-09 NOTE — ED Triage Notes (Signed)
Cough-fatigue runny nose x 2 days

## 2023-09-09 NOTE — Discharge Instructions (Addendum)
Davey's COVID, influenza and RSV test were all negative.  She has not had a fever or significantly productive cough for very long I do not do not think she has pneumonia at this time.  She starts having fever the cough does not go away or gets worse, return to the urgent care or see your primary care doctor.  I suspect that she has a viral respiratory infection that will gradually improve.  Use her Symbicort inhaler at bedtime and then as needed.  Stop by the pharmacy to pick up her cough syrup.  Use the cough syrup mostly at night but if she needs to use it during the day decrease the dose so she does not get too sleepy.

## 2024-03-11 ENCOUNTER — Other Ambulatory Visit: Payer: Self-pay

## 2024-03-11 ENCOUNTER — Emergency Department

## 2024-03-11 ENCOUNTER — Encounter: Payer: Self-pay | Admitting: Emergency Medicine

## 2024-03-11 ENCOUNTER — Emergency Department
Admission: EM | Admit: 2024-03-11 | Discharge: 2024-03-12 | Disposition: A | Discharge status: Home / Self Care | Attending: Emergency Medicine | Admitting: Emergency Medicine

## 2024-03-11 DIAGNOSIS — J449 Chronic obstructive pulmonary disease, unspecified: Secondary | ICD-10-CM | POA: Diagnosis not present

## 2024-03-11 DIAGNOSIS — N189 Chronic kidney disease, unspecified: Secondary | ICD-10-CM | POA: Insufficient documentation

## 2024-03-11 DIAGNOSIS — I10 Essential (primary) hypertension: Secondary | ICD-10-CM

## 2024-03-11 DIAGNOSIS — I129 Hypertensive chronic kidney disease with stage 1 through stage 4 chronic kidney disease, or unspecified chronic kidney disease: Secondary | ICD-10-CM | POA: Diagnosis present

## 2024-03-11 DIAGNOSIS — Z79899 Other long term (current) drug therapy: Secondary | ICD-10-CM | POA: Diagnosis not present

## 2024-03-11 DIAGNOSIS — E1122 Type 2 diabetes mellitus with diabetic chronic kidney disease: Secondary | ICD-10-CM | POA: Insufficient documentation

## 2024-03-11 LAB — URINALYSIS, ROUTINE W REFLEX MICROSCOPIC
Bilirubin Urine: NEGATIVE
Glucose, UA: >=500 mg/dL — AB
Ketones, ur: NEGATIVE mg/dL
Nitrite: NEGATIVE
Protein, ur: >=300 mg/dL — AB
Specific Gravity, Urine: 1.001 — ABNORMAL LOW (ref 1.005–1.030)
pH: 7 (ref 5.0–8.0)

## 2024-03-11 LAB — BASIC METABOLIC PANEL WITH GFR
Anion gap: 12 (ref 5–15)
BUN: 32 mg/dL — ABNORMAL HIGH (ref 8–23)
CO2: 21 mmol/L — ABNORMAL LOW (ref 22–32)
Calcium: 9.5 mg/dL (ref 8.9–10.3)
Chloride: 103 mmol/L (ref 98–111)
Creatinine, Ser: 1.72 mg/dL — ABNORMAL HIGH (ref 0.44–1.00)
GFR, Estimated: 28 mL/min — ABNORMAL LOW (ref >60)
Glucose, Bld: 118 mg/dL — ABNORMAL HIGH (ref 70–99)
Potassium: 4.4 mmol/L (ref 3.5–5.1)
Sodium: 136 mmol/L (ref 135–145)

## 2024-03-11 LAB — CBC
HCT: 49 % — ABNORMAL HIGH (ref 36.0–46.0)
Hemoglobin: 15.3 g/dL — ABNORMAL HIGH (ref 12.0–15.0)
MCH: 25.2 pg — ABNORMAL LOW (ref 26.0–34.0)
MCHC: 31.2 g/dL (ref 30.0–36.0)
MCV: 80.7 fL (ref 80.0–100.0)
Platelets: 167 10*3/uL (ref 150–400)
RBC: 6.07 MIL/uL — ABNORMAL HIGH (ref 3.87–5.11)
RDW: 17.1 % — ABNORMAL HIGH (ref 11.5–15.5)
WBC: 5.2 10*3/uL (ref 4.0–10.5)
nRBC: 0 % (ref 0.0–0.2)

## 2024-03-11 MED ORDER — CLONIDINE HCL 0.1 MG PO TABS
0.1000 mg | ORAL_TABLET | Freq: Once | ORAL | Status: AC
  Administered 2024-03-12: 0.1 mg via ORAL
  Filled 2024-03-11: qty 1

## 2024-03-11 NOTE — ED Triage Notes (Addendum)
 Pt in with daughter, who reports pt has been increasingly hypertensive over the past month despite taking her daily bp meds - PCP was going to plan for her to start new Losartan /hydrochlorothiazide tomorrow. Denies any HA or blurred vision, denies any cp but has exertional sob. 216/129 currently, pt currently on Nitrofurantin for UTI per daughter

## 2024-03-11 NOTE — ED Provider Notes (Signed)
 [facility] Provider Note    Event Date/Time   First MD Initiated Contact with Patient 03/11/24 2313     (approximate)  History   Chief Complaint: Hypertension  HPI  [name] is a 89 y.o. female with a past medical history of CKD, COPD, diabetes, gastric reflux, hypertension, hyperlipidemia, presents to the emergency department for elevated blood pressure.  According to the patient and the daughter they have been having issues with the patient's blood pressure trending up.  Patient was taking olmesartan 20 mg, her doctor asked them to split it in half and take 10 mg in the morning and 10 in the evening which they have been doing over the past week or so but this has not been helping.  They talk to the doctor today who has called in losartan /hydrochlorothiazide instead of the olmesartan.  They have not started this new medication yet although they have in their possession and were told to start tomorrow.  However tonight the patient's blood pressure increased over 200s and they were concerned so they came to the emergency department for evaluation.  Patient denies any chest pain.  Patient denies any shortness of breath.  Patient denies any headache.  Appears to be largely asymptomatic.  Blood pressure currently 216/129.  Physical Exam   Triage Vital Signs: ED Triage Vitals  Encounter Vitals Group     BP 03/11/24 2046 (!) 216/129     Systolic BP Percentile --      Diastolic BP Percentile --      Pulse Rate 03/11/24 2046 77     Resp 03/11/24 2046 20     Temp 03/11/24 2046 97.9 F (36.6 C)     Temp Source 03/11/24 2046 Oral     SpO2 03/11/24 2046 96 %     Weight 03/11/24 2047 169 lb 15.6 oz (77.1 kg)     Height --      Head Circumference --      Peak Flow --      Pain Score 03/11/24 2047 0     Pain Loc --      Pain Education --      Exclude from Growth Chart --     Most recent vital signs: Vitals:   03/11/24 2046  BP: (!) 216/129  Pulse: 77   Resp: 20  Temp: 97.9 F (36.6 C)  SpO2: 96%    General: Awake, no distress.  CV:  Good peripheral perfusion.  Regular rate and rhythm  Resp:  Normal effort.  Equal breath sounds bilaterally.  Abd:  No distention.  Soft, nontender.  No rebound or guarding.  ED Results / Procedures / Treatments   EKG  EKG viewed and interpreted by myself shows a normal sinus rhythm at 62 bpm with a widened QRS, left axis deviation, prolonged PR interval otherwise reassuring intervals with nonspecific ST changes and occasional PVC.  RADIOLOGY  I have reviewed interpret the chest x-ray images.  Pacemaker present but there does not appear to be any consolidation seen on my evaluation. Radiology is read the x-ray is negative for active disease.   MEDICATIONS ORDERED IN ED: Medications  cloNIDine  (CATAPRES ) tablet 0.1 mg (has no administration in time range)     IMPRESSION / MDM / ASSESSMENT AND PLAN / ED COURSE  I reviewed the triage vital signs and the nursing notes.  Patient's presentation is most consistent with acute presentation with potential threat to life or bodily function.  Patient presents to  the emergency department for hypertension.  Patient appears to be largely asymptomatic although significantly hypertensive currently 216/129.  Patient is only taking olmesartan 10 mg twice daily.  Her doctor however just called in a prescription for losartan /hydrochlorothiazide which they are going to start tomorrow instead of the olmesartan.  Patient's lab work today is reassuring showing reassuring CBC, chronic kidney disease on her chemistry but no acute concerning finding.  Urinalysis shows minimal amount of white blood cells patient currently taking nitrofurantoin per daughter for UTI, denies urinary symptoms.  We will dose 0.1 mg of clonidine  in the emergency department and monitor for improvement in the blood pressure.  If the patient does show improvement I believe the patient could be started on  clonidine  as a as needed medication for any systolic blood pressure over 180 up to once daily.  Patient agreeable to plan of care.  Workup is reassuring.  Patient's blood pressure has decreased to 178/84 after clonidine .  Will prescribe clonidine  to be used only if needed for systolic blood pressure greater than 180 up to once daily.  Patient to follow-up with her doctor regarding this.  Patient also will start her new medication prescribed by her doctor later today.  FINAL CLINICAL IMPRESSION(S) / ED DIAGNOSES   Hypertension   Note:  This document was prepared using Dragon voice recognition software and may include unintentional dictation errors.   Ruth Cove, MD 03/12/24 [phone]

## 2024-03-12 MED ORDER — CLONIDINE HCL 0.1 MG PO TABS: ORAL_TABLET | ORAL | 0 refills | Status: DC

## 2024-03-12 MED ORDER — CLONIDINE HCL 0.1 MG PO TABS: ORAL_TABLET | ORAL | 0 refills | Status: AC

## 2024-03-26 ENCOUNTER — Other Ambulatory Visit: Payer: Self-pay

## 2024-03-26 ENCOUNTER — Emergency Department
Admission: EM | Admit: 2024-03-26 | Discharge: 2024-03-26 | Disposition: A | Discharge status: Home / Self Care | Attending: Emergency Medicine | Admitting: Emergency Medicine

## 2024-03-26 DIAGNOSIS — N189 Chronic kidney disease, unspecified: Secondary | ICD-10-CM | POA: Insufficient documentation

## 2024-03-26 DIAGNOSIS — R4182 Altered mental status, unspecified: Secondary | ICD-10-CM | POA: Diagnosis present

## 2024-03-26 DIAGNOSIS — N3000 Acute cystitis without hematuria: Secondary | ICD-10-CM | POA: Insufficient documentation

## 2024-03-26 DIAGNOSIS — E1122 Type 2 diabetes mellitus with diabetic chronic kidney disease: Secondary | ICD-10-CM | POA: Insufficient documentation

## 2024-03-26 DIAGNOSIS — I129 Hypertensive chronic kidney disease with stage 1 through stage 4 chronic kidney disease, or unspecified chronic kidney disease: Secondary | ICD-10-CM | POA: Insufficient documentation

## 2024-03-26 DIAGNOSIS — J449 Chronic obstructive pulmonary disease, unspecified: Secondary | ICD-10-CM | POA: Insufficient documentation

## 2024-03-26 LAB — URINALYSIS, ROUTINE W REFLEX MICROSCOPIC
Bilirubin Urine: NEGATIVE
Glucose, UA: >=500 mg/dL — AB
Ketones, ur: NEGATIVE mg/dL
Nitrite: NEGATIVE
Protein, ur: NEGATIVE mg/dL
Specific Gravity, Urine: 1.014 (ref 1.005–1.030)
pH: 5 (ref 5.0–8.0)

## 2024-03-26 LAB — COMPREHENSIVE METABOLIC PANEL WITH GFR
ALT: 28 U/L (ref 0–44)
AST: 38 U/L (ref 15–41)
Albumin: 3.8 g/dL (ref 3.5–5.0)
Alkaline Phosphatase: 69 U/L (ref 38–126)
Anion gap: 11 (ref 5–15)
BUN: 34 mg/dL — ABNORMAL HIGH (ref 8–23)
CO2: 22 mmol/L (ref 22–32)
Calcium: 8.9 mg/dL (ref 8.9–10.3)
Chloride: 105 mmol/L (ref 98–111)
Creatinine, Ser: 2.02 mg/dL — ABNORMAL HIGH (ref 0.44–1.00)
GFR, Estimated: 23 mL/min — ABNORMAL LOW (ref >60)
Glucose, Bld: 162 mg/dL — ABNORMAL HIGH (ref 70–99)
Potassium: 5.1 mmol/L (ref 3.5–5.1)
Sodium: 138 mmol/L (ref 135–145)
Total Bilirubin: 1.4 mg/dL — ABNORMAL HIGH (ref 0.0–1.2)
Total Protein: 6.8 g/dL (ref 6.5–8.1)

## 2024-03-26 LAB — CBC WITH DIFFERENTIAL/PLATELET
Abs Immature Granulocytes: 0.01 10*3/uL (ref 0.00–0.07)
Basophils Absolute: 0 10*3/uL (ref 0.0–0.1)
Basophils Relative: 1 %
Eosinophils Absolute: 0.1 10*3/uL (ref 0.0–0.5)
Eosinophils Relative: 3 %
HCT: 43.9 % (ref 36.0–46.0)
Hemoglobin: 13.9 g/dL (ref 12.0–15.0)
Immature Granulocytes: 0 %
Lymphocytes Relative: 16 %
Lymphs Abs: 0.8 10*3/uL (ref 0.7–4.0)
MCH: 25 pg — ABNORMAL LOW (ref 26.0–34.0)
MCHC: 31.7 g/dL (ref 30.0–36.0)
MCV: 79.1 fL — ABNORMAL LOW (ref 80.0–100.0)
Monocytes Absolute: 0.4 10*3/uL (ref 0.1–1.0)
Monocytes Relative: 8 %
Neutro Abs: 3.7 10*3/uL (ref 1.7–7.7)
Neutrophils Relative %: 72 %
Platelets: 137 10*3/uL — ABNORMAL LOW (ref 150–400)
RBC: 5.55 MIL/uL — ABNORMAL HIGH (ref 3.87–5.11)
RDW: 15.9 % — ABNORMAL HIGH (ref 11.5–15.5)
WBC: 5.1 10*3/uL (ref 4.0–10.5)
nRBC: 0 % (ref 0.0–0.2)

## 2024-03-26 LAB — TROPONIN I (HIGH SENSITIVITY)
Troponin I (High Sensitivity): 20 ng/L — ABNORMAL HIGH (ref <18)
Troponin I (High Sensitivity): 21 ng/L — ABNORMAL HIGH (ref <18)

## 2024-03-26 MED ORDER — CEPHALEXIN 500 MG PO CAPS
500.0000 mg | ORAL_CAPSULE | Freq: Once | ORAL | Status: AC
  Administered 2024-03-26: 500 mg via ORAL
  Filled 2024-03-26: qty 1

## 2024-03-26 MED ORDER — CEPHALEXIN 500 MG PO CAPS
500.0000 mg | ORAL_CAPSULE | Freq: Two times a day (BID) | ORAL | 0 refills | Status: AC
Start: 2024-03-26 — End: 2024-04-02

## 2024-03-26 NOTE — ED Provider Notes (Signed)
 [facility] Provider Note    Event Date/Time   First MD Initiated Contact with Patient 03/26/24 1140     (approximate)   History   Altered Mental Status   HPI  [name] is a 89 y.o. female with a history of hypertension, hyperlipidemia, CKD, COPD, diabetes, and sick sinus syndrome status post pacemaker, who presents with an episode of apparent altered mental status.  Per EMS, family noted that after eating breakfast her blood glucose went up to 157 and subsequently something "seemed off" with the patient.  The patient herself denies any acute complaints.  She states that she felt at her baseline earlier and still feels fine.   I reviewed the past medical records.  The patient was admitted to the hospitalist service after presenting with altered mental status.  She was diagnosed with a TIA.   Physical Exam   Triage Vital Signs: ED Triage Vitals  Encounter Vitals Group     BP      Systolic BP Percentile      Diastolic BP Percentile      Pulse      Resp      Temp      Temp src      SpO2      Weight      Height      Head Circumference      Peak Flow      Pain Score      Pain Loc      Pain Education      Exclude from Growth Chart     Most recent vital signs: Vitals:   03/26/24 1300 03/26/24 1400  BP: (!) 168/78 (!) 171/80  Pulse: 60 62  Resp: 14 14  Temp:    SpO2: 99% 99%     General: Alert, oriented x 2, no distress.  CV:  Good peripheral perfusion.  Resp:  Normal effort.  Lungs CTAB. Abd:  Soft and nontender.  No distention.  Other:  EOMI.  PERRLA.  No photophobia.  Normal speech.  No facial droop.  Motor intact in all extremities.  Normal coordination.   ED Results / Procedures / Treatments   Labs (all labs ordered are listed, but only abnormal results are displayed) Labs Reviewed  COMPREHENSIVE METABOLIC PANEL WITH GFR - Abnormal; Notable for the following components:      Result Value   Glucose, Bld 162 (*)     BUN 34 (*)    Creatinine, Ser 2.02 (*)    Total Bilirubin 1.4 (*)    GFR, Estimated 23 (*)    All other components within normal limits  URINALYSIS, ROUTINE W REFLEX MICROSCOPIC - Abnormal; Notable for the following components:   Color, Urine YELLOW (*)    APPearance CLOUDY (*)    Glucose, UA >=500 (*)    Hgb urine dipstick SMALL (*)    Leukocytes,Ua MODERATE (*)    Bacteria, UA MANY (*)    All other components within normal limits  CBC WITH DIFFERENTIAL/PLATELET - Abnormal; Notable for the following components:   RBC 5.55 (*)    MCV 79.1 (*)    MCH 25.0 (*)    RDW 15.9 (*)    Platelets 137 (*)    All other components within normal limits  TROPONIN I (HIGH SENSITIVITY) - Abnormal; Notable for the following components:   Troponin I (High Sensitivity) 20 (*)    All other components within normal limits  TROPONIN I (HIGH SENSITIVITY) -  Abnormal; Notable for the following components:   Troponin I (High Sensitivity) 21 (*)    All other components within normal limits  CBC WITH DIFFERENTIAL/PLATELET     EKG  ED ECG REPORT I, Lind Repine, the attending physician, personally viewed and interpreted this ECG.  Date: 03/26/2024 EKG Time: 1146 Rate: 90 Rhythm: Paced rhythm ST/T Wave abnormalities: normal Narrative Interpretation: no evidence of acute ischemia    RADIOLOGY    PROCEDURES:  Critical Care performed: No  Procedures   MEDICATIONS ORDERED IN ED: Medications  cephALEXin  (KEFLEX ) capsule 500 mg (has no administration in time range)     IMPRESSION / MDM / ASSESSMENT AND PLAN / ED COURSE  I reviewed the triage vital signs and the nursing notes.  89 year old female with PMH as noted above presents with an episode of apparent altered mental status although the patient herself denies any acute complaints.  On exam her vital signs are normal except for hypertension.  Physical exam is otherwise unremarkable for acute findings.  Differential diagnosis  includes, but is not limited to, hypo or hyperglycemia, dehydration, electrolyte abnormality, other metabolic cause, UTI or other infection, less likely cardiac etiology.  I have a low suspicion for TIA or other CNS cause.  There is no indication for imaging at this time.  We will obtain lab workup, urinalysis, and reassess.  Patient's presentation is most consistent with acute presentation with potential threat to life or bodily function.  The patient is on the cardiac monitor to evaluate for evidence of arrhythmia and/or significant heart rate changes.  ----------------------------------------- 3:22 PM on 03/26/2024 -----------------------------------------  Urinalysis shows findings compatible with a UTI.  Other lab workup is reassuring.  CBC shows no acute findings.  CMP shows an elevated creatinine consistent with baseline.  Troponin is borderline elevated but repeat shows no change.  On reassessment, now with the family members here, they state that the patient is at her baseline mental status.  I did consider whether she may benefit from inpatient admission, but given that she is at her baseline and the workup is reassuring, she is appropriate for discharge home.  The patient feels well and would like to go home.  Overall I suspect that her symptoms are related to an acute UTI.  She is on nitrofurantoin for UTI prevention.  Her most recent urine cultures from our system were from over 4 years ago.  I will prescribe a week of Keflex .  I counseled the patient and family on the results of the workup and plan of care.  I gave strict return precautions, and they expressed understanding.   FINAL CLINICAL IMPRESSION(S) / ED DIAGNOSES   Final diagnoses:  Altered mental status, unspecified altered mental status type  Acute cystitis without hematuria     Rx / DC Orders   ED Discharge Orders          Ordered    cephALEXin  (KEFLEX ) 500 MG capsule  2 times daily        03/26/24 1515              Note:  This document was prepared using Dragon voice recognition software and may include unintentional dictation errors.    Lind Repine, MD 03/26/24 1524

## 2024-03-26 NOTE — ED Triage Notes (Signed)
 Pt to ED via [facility] from home. Pt's family stated that glucose level was 91 this morning and after eating breakfast, it "spiked to 157." Pt's family stated something "seemed off" with pt. On EMS arrival, cbg 146. Pt denies pain and states she feels fine.

## 2024-03-26 NOTE — Discharge Instructions (Addendum)
 Pause the nitrofurantoin and take the keflex  as prescribed for the next week.  Return to the ER for new, worsening or recurrent change in mental status, weakness, confusion, or any other new or worsening symptoms that concern you.

## 2024-07-05 ENCOUNTER — Encounter: Payer: Self-pay | Admitting: Cardiology

## 2024-07-05 ENCOUNTER — Encounter
Admission: RE | Disposition: A | Discharge status: Home / Self Care | Payer: Self-pay | Source: Home / Self Care | Attending: Cardiology

## 2024-07-05 ENCOUNTER — Other Ambulatory Visit: Payer: Self-pay

## 2024-07-05 ENCOUNTER — Ambulatory Visit
Admission: RE | Admit: 2024-07-05 | Discharge: 2024-07-05 | Disposition: A | Discharge status: Home / Self Care | Attending: Cardiology | Admitting: Cardiology

## 2024-07-05 DIAGNOSIS — Z45010 Encounter for checking and testing of cardiac pacemaker pulse generator [battery]: Secondary | ICD-10-CM | POA: Diagnosis present

## 2024-07-05 HISTORY — PX: PPM GENERATOR CHANGEOUT: EP1233

## 2024-07-05 LAB — GLUCOSE, CAPILLARY: Glucose-Capillary: 152 mg/dL — ABNORMAL HIGH (ref 70–99)

## 2024-07-05 SURGERY — PPM GENERATOR CHANGEOUT
Anesthesia: Moderate Sedation

## 2024-07-05 MED ORDER — MIDAZOLAM HCL 2 MG/2ML IJ SOLN
INTRAMUSCULAR | Status: AC
  Filled 2024-07-05: qty 2

## 2024-07-05 MED ORDER — CHLORHEXIDINE GLUCONATE CLOTH 2 % EX PADS
6.0000 | MEDICATED_PAD | Freq: Every day | CUTANEOUS | Status: DC
  Administered 2024-07-05: 6 via TOPICAL

## 2024-07-05 MED ORDER — CEFAZOLIN SODIUM-DEXTROSE 2-4 GM/100ML-% IV SOLN
INTRAVENOUS | Status: AC
Start: 2024-07-05 — End: 2024-07-05
  Filled 2024-07-05: qty 100

## 2024-07-05 MED ORDER — CEPHALEXIN 250 MG PO CAPS: 250.0000 mg | ORAL_CAPSULE | Freq: Four times a day (QID) | ORAL | 0 refills | Status: AC

## 2024-07-05 MED ORDER — SODIUM CHLORIDE 0.9 % IV SOLN
INTRAVENOUS | Status: DC
  Administered 2024-07-05: 500 mL via INTRAVENOUS

## 2024-07-05 MED ORDER — HEPARIN (PORCINE) IN NACL 1000-0.9 UT/500ML-% IV SOLN
INTRAVENOUS | Status: AC
  Filled 2024-07-05: qty 1000

## 2024-07-05 MED ORDER — ONDANSETRON HCL 4 MG/2ML IJ SOLN: 4.0000 mg | Freq: Four times a day (QID) | INTRAMUSCULAR | Status: DC | PRN

## 2024-07-05 MED ORDER — CEFAZOLIN SODIUM-DEXTROSE 2-4 GM/100ML-% IV SOLN
2.0000 g | INTRAVENOUS | Status: AC
  Administered 2024-07-05: 2 g via INTRAVENOUS

## 2024-07-05 MED ORDER — LIDOCAINE HCL 1 % IJ SOLN
INTRAMUSCULAR | Status: AC
Start: 2024-07-05 — End: 2024-07-05
  Filled 2024-07-05: qty 40

## 2024-07-05 MED ORDER — FENTANYL CITRATE (PF) 100 MCG/2ML IJ SOLN
INTRAMUSCULAR | Status: AC
  Filled 2024-07-05: qty 2

## 2024-07-05 MED ORDER — HEPARIN (PORCINE) IN NACL 1000-0.9 UT/500ML-% IV SOLN
INTRAVENOUS | Status: DC | PRN
  Administered 2024-07-05: 500 mL

## 2024-07-05 MED ORDER — SODIUM CHLORIDE 0.9 % IV SOLN
80.0000 mg | INTRAVENOUS | Status: DC
  Filled 2024-07-05: qty 2

## 2024-07-05 MED ORDER — LIDOCAINE HCL 1 % IJ SOLN
INTRAMUSCULAR | Status: AC
  Filled 2024-07-05: qty 20

## 2024-07-05 MED ORDER — ACETAMINOPHEN 325 MG PO TABS: 325.0000 mg | ORAL_TABLET | ORAL | Status: DC | PRN

## 2024-07-05 MED ORDER — MIDAZOLAM HCL 2 MG/2ML IJ SOLN
INTRAMUSCULAR | Status: DC | PRN
  Administered 2024-07-05: .5 mg via INTRAVENOUS

## 2024-07-05 MED ORDER — LIDOCAINE HCL (PF) 1 % IJ SOLN
INTRAMUSCULAR | Status: DC | PRN
  Administered 2024-07-05: 30 mL

## 2024-07-05 MED ORDER — SODIUM CHLORIDE 0.9 % IV SOLN
INTRAVENOUS | Status: DC | PRN
  Administered 2024-07-05: 80 mg

## 2024-07-05 MED ORDER — FENTANYL CITRATE (PF) 100 MCG/2ML IJ SOLN
INTRAMUSCULAR | Status: DC | PRN
  Administered 2024-07-05: 12.5 ug via INTRAVENOUS

## 2024-07-05 SURGICAL SUPPLY — 10 items
BENZOIN TINCTURE PRP APPL 2/3 (GAUZE/BANDAGES/DRESSINGS) IMPLANT
CABLE SURG 12 DISP A/V CHANNEL (MISCELLANEOUS) IMPLANT
DEVICE DSSCT PLSMBLD 3.0S LGHT (MISCELLANEOUS) IMPLANT
DRAPE INCISE 23X17 STRL (DRAPES) IMPLANT
IPG PACE AZUR XT DR MRI W1DR01 (Pacemaker) IMPLANT
PAD ELECT DEFIB RADIOL ZOLL (MISCELLANEOUS) IMPLANT
POUCH AIGIS-R ANTIBACT PPM MED (Mesh General) IMPLANT
SUT VIC AB 2-0 CT2 27 (SUTURE) IMPLANT
SUT VIC AB 4-0 PS2 18 (SUTURE) IMPLANT
TRAY PACEMAKER INSERTION (PACKS) ×1 IMPLANT

## 2024-07-05 NOTE — Discharge Instructions (Addendum)
 Resume clopidogrel  07/07/2024.  Patient may shower 07/07/2024.  May remove outer bandage after shower, leave Steri-Strips on.  Pacemaker Battery Change, Care After This sheet gives you information about how to care for yourself after your procedure. Your health care provider may also give you more specific instructions. If you have problems or questions, contact your health care provider. What can I expect after the procedure? After the procedure, it is common to have these symptoms at the site where the pacemaker was inserted: Mild pain or soreness. Slight bruising. Some swelling over the incisions. A slight bump over the skin where the device was placed (if it was implanted in the upper chest area). Sometimes, it is possible to feel the device under the skin. This is normal. Follow these instructions at home: Incision care  Keep the incision clean and dry for 2-3 days after the procedure or as told by your health care provider. It takes several weeks for the incision site to completely heal. Do not remove the bandage (dressing) on your chest until told to do so by your health care provider. Leave stitches (sutures), skin glue, or adhesive strips in place. These skin closures may need to stay in place for 2 weeks or longer. If adhesive strip edges start to loosen and curl up, you may trim the loose edges. Do not remove adhesive strips completely unless your health care provider tells you to do that. You may shower in 2 days Pat the incision area dry with a clean towel. Do not rub the area. This may cause bleeding. Check your incision area every day for signs of infection. Check for: More redness, swelling, or pain. Fluid or blood. Warmth. Pus or a bad smell. Avoid putting pressure on the area where the pacemaker was placed. Women may want to place a small pad over the incision site to protect it from their bra strap. Avoid using lotion on skin until insertion site heals  completely Medicines Take over-the-counter and prescription medicines only as told by your health care provider. If you were prescribed an antibiotic medicine, take it as told by your health care provider. Do not stop taking the antibiotic even if you start to feel better. Activity For the first 2 weeks, or as long as told by your health care provider: Avoid lifting anything heavy Avoid exercise or activities that take a lot of effort. If you were given a medicine to help you relax (sedative) during the procedure, it can affect you for several hours. Do not drive or operate machinery until your health care provider says that it is safe. General instructions Do not use any products that contain nicotine or tobacco, such as cigarettes, e-cigarettes, and chewing tobacco. These can delay incision healing after surgery. If you need help quitting, ask your health care provider. Always let all health care providers, including dentists, know about your pacemaker before you have any medical procedures or tests. You may be shown how to transfer data from your pacemaker through the phone to your health care provider. Wear a medical ID bracelet or necklace stating that you have a pacemaker, and carry a pacemaker ID card with you at all times. Avoid close and prolonged exposure to electrical devices that have strong magnetic fields. These include: Insurance claims handler. When at the airport, let officials know that you have a pacemaker. Carry your pacemaker ID card. Metal detectors. If you must pass through a metal detector, walk through it quickly. Do not stop under the detector or  stand near it. When using your mobile phone, hold it to the ear opposite the pacemaker. Do not leave your mobile phone in a pocket over the pacemaker. Your pacemaker battery will last for 5-15 years. Your health care provider will do routine checks to know when the battery is starting to run down. When this happens, the  pacemaker will need to be replaced. Keep all follow-up visits as told by your health care provider. This is important. Contact a health care provider if: You have pain at the incision site that is not relieved by medicines. You have any of these signs of infection: More redness, swelling, or pain around your incision. Fluid or blood coming from your incision. Warmth coming from your incision. Pus or a bad smell coming from your incision. A fever. You feel brief, occasional palpitations, light-headedness, or any symptoms that you think might be related to your heart. Get help right away if: You have chest pain that is different from the pain at the pacemaker site. You develop a red streak that extends above or below the incision site. You have shortness of breath. You have palpitations or an irregular heartbeat. You have light-headedness that does not go away quickly. You faint or have dizzy spells. Your pulse suddenly drops or increases rapidly and does not return to normal. You gain weight and your legs and ankles swell. Summary After the procedure, it is common to have pain, soreness, and some swelling or bruising where the pacemaker was inserted. Keep your incision clean and dry. Follow instructions from your health care provider about how to take care of your incision. Check your incision every day for signs of infection, such as more pain or swelling, pus or a bad smell, warmth, or leaking fluid or blood. Carry a pacemaker ID card with you at all times. This information is not intended to replace advice given to you by your health care provider. Make sure you discuss any questions you have with your health care provider. Document Revised: 09/15/2019 Document Reviewed: 09/15/2019 Elsevier Patient Education  2023 [employer].

## 2024-07-06 ENCOUNTER — Encounter: Payer: Self-pay | Admitting: Cardiology
